# Patient Record
Sex: Female | Born: 1935 | Race: Black or African American | Hispanic: No | Smoking: Current some day smoker
Health system: Southern US, Community
[De-identification: ages and names within clinical notes are randomized; demographics above are authoritative.]

## PROBLEM LIST (undated history)

## (undated) ENCOUNTER — Emergency Department (HOSPITAL_COMMUNITY): Admission: EM | Payer: Medicare Other | Source: Home / Self Care

## (undated) DIAGNOSIS — N184 Chronic kidney disease, stage 4 (severe): Secondary | ICD-10-CM

## (undated) DIAGNOSIS — I82409 Acute embolism and thrombosis of unspecified deep veins of unspecified lower extremity: Secondary | ICD-10-CM

## (undated) DIAGNOSIS — I639 Cerebral infarction, unspecified: Secondary | ICD-10-CM

## (undated) DIAGNOSIS — I5032 Chronic diastolic (congestive) heart failure: Secondary | ICD-10-CM

## (undated) DIAGNOSIS — E782 Mixed hyperlipidemia: Secondary | ICD-10-CM

## (undated) DIAGNOSIS — I745 Embolism and thrombosis of iliac artery: Secondary | ICD-10-CM

## (undated) DIAGNOSIS — I119 Hypertensive heart disease without heart failure: Secondary | ICD-10-CM

## (undated) DIAGNOSIS — I358 Other nonrheumatic aortic valve disorders: Secondary | ICD-10-CM

## (undated) DIAGNOSIS — I313 Pericardial effusion (noninflammatory): Secondary | ICD-10-CM

## (undated) DIAGNOSIS — I10 Essential (primary) hypertension: Secondary | ICD-10-CM

## (undated) DIAGNOSIS — I951 Orthostatic hypotension: Secondary | ICD-10-CM

## (undated) DIAGNOSIS — J449 Chronic obstructive pulmonary disease, unspecified: Secondary | ICD-10-CM

## (undated) DIAGNOSIS — Z720 Tobacco use: Secondary | ICD-10-CM

## (undated) DIAGNOSIS — I701 Atherosclerosis of renal artery: Secondary | ICD-10-CM

## (undated) DIAGNOSIS — I739 Peripheral vascular disease, unspecified: Secondary | ICD-10-CM

## (undated) DIAGNOSIS — I3139 Other pericardial effusion (noninflammatory): Secondary | ICD-10-CM

## (undated) DIAGNOSIS — R55 Syncope and collapse: Secondary | ICD-10-CM

## (undated) DIAGNOSIS — J9600 Acute respiratory failure, unspecified whether with hypoxia or hypercapnia: Secondary | ICD-10-CM

## (undated) DIAGNOSIS — IMO0001 Reserved for inherently not codable concepts without codable children: Secondary | ICD-10-CM

## (undated) DIAGNOSIS — I4819 Other persistent atrial fibrillation: Secondary | ICD-10-CM

## (undated) HISTORY — DX: Other pericardial effusion (noninflammatory): I31.39

## (undated) HISTORY — PX: CATARACT EXTRACTION: SUR2

## (undated) HISTORY — DX: Reserved for inherently not codable concepts without codable children: IMO0001

## (undated) HISTORY — DX: Chronic diastolic (congestive) heart failure: I50.32

## (undated) HISTORY — DX: Atherosclerosis of renal artery: I70.1

## (undated) HISTORY — DX: Pericardial effusion (noninflammatory): I31.3

## (undated) HISTORY — DX: Other persistent atrial fibrillation: I48.19

## (undated) HISTORY — PX: OTHER SURGICAL HISTORY: SHX169

## (undated) HISTORY — DX: Hypertensive heart disease without heart failure: I11.9

## (undated) HISTORY — DX: Cerebral infarction, unspecified: I63.9

## (undated) HISTORY — DX: Chronic kidney disease, stage 4 (severe): N18.4

## (undated) HISTORY — DX: Syncope and collapse: R55

## (undated) HISTORY — DX: Orthostatic hypotension: I95.1

---

## 1997-11-08 HISTORY — PX: BRAIN TUMOR EXCISION: SHX577

## 2000-06-07 ENCOUNTER — Encounter (INDEPENDENT_AMBULATORY_CARE_PROVIDER_SITE_OTHER): Payer: Self-pay | Admitting: *Deleted

## 2000-06-07 ENCOUNTER — Other Ambulatory Visit: Admission: RE | Admit: 2000-06-07 | Discharge: 2000-06-07 | Payer: Self-pay | Admitting: Radiology

## 2000-06-07 ENCOUNTER — Encounter: Admission: RE | Admit: 2000-06-07 | Discharge: 2000-06-07 | Payer: Self-pay | Admitting: Internal Medicine

## 2000-06-07 ENCOUNTER — Encounter: Payer: Self-pay | Admitting: Internal Medicine

## 2001-09-25 ENCOUNTER — Encounter: Payer: Self-pay | Admitting: Internal Medicine

## 2001-09-25 ENCOUNTER — Ambulatory Visit (HOSPITAL_COMMUNITY): Admission: RE | Admit: 2001-09-25 | Discharge: 2001-09-25 | Payer: Self-pay | Admitting: Internal Medicine

## 2001-09-29 ENCOUNTER — Other Ambulatory Visit: Admission: RE | Admit: 2001-09-29 | Discharge: 2001-09-29 | Payer: Self-pay | Admitting: Obstetrics and Gynecology

## 2002-10-18 ENCOUNTER — Ambulatory Visit (HOSPITAL_COMMUNITY): Admission: RE | Admit: 2002-10-18 | Discharge: 2002-10-18 | Payer: Self-pay | Admitting: Internal Medicine

## 2002-10-18 ENCOUNTER — Encounter: Payer: Self-pay | Admitting: Internal Medicine

## 2003-10-24 ENCOUNTER — Ambulatory Visit (HOSPITAL_COMMUNITY): Admission: RE | Admit: 2003-10-24 | Discharge: 2003-10-24 | Payer: Self-pay | Admitting: Internal Medicine

## 2004-11-18 ENCOUNTER — Ambulatory Visit (HOSPITAL_COMMUNITY): Admission: RE | Admit: 2004-11-18 | Discharge: 2004-11-18 | Payer: Self-pay | Admitting: Internal Medicine

## 2005-11-23 ENCOUNTER — Ambulatory Visit (HOSPITAL_COMMUNITY): Admission: RE | Admit: 2005-11-23 | Discharge: 2005-11-23 | Payer: Self-pay | Admitting: Internal Medicine

## 2006-11-25 ENCOUNTER — Ambulatory Visit (HOSPITAL_COMMUNITY): Admission: RE | Admit: 2006-11-25 | Discharge: 2006-11-25 | Payer: Self-pay | Admitting: Internal Medicine

## 2007-11-28 ENCOUNTER — Ambulatory Visit (HOSPITAL_COMMUNITY): Admission: RE | Admit: 2007-11-28 | Discharge: 2007-11-28 | Payer: Self-pay | Admitting: Internal Medicine

## 2008-02-21 ENCOUNTER — Ambulatory Visit (HOSPITAL_COMMUNITY): Admission: RE | Admit: 2008-02-21 | Discharge: 2008-02-21 | Payer: Self-pay | Admitting: Internal Medicine

## 2008-02-23 ENCOUNTER — Ambulatory Visit (HOSPITAL_COMMUNITY): Admission: RE | Admit: 2008-02-23 | Discharge: 2008-02-23 | Payer: Self-pay | Admitting: Internal Medicine

## 2008-05-06 ENCOUNTER — Emergency Department (HOSPITAL_COMMUNITY): Admission: EM | Admit: 2008-05-06 | Discharge: 2008-05-06 | Payer: Self-pay | Admitting: Emergency Medicine

## 2008-05-08 DIAGNOSIS — I82409 Acute embolism and thrombosis of unspecified deep veins of unspecified lower extremity: Secondary | ICD-10-CM

## 2008-05-08 HISTORY — DX: Acute embolism and thrombosis of unspecified deep veins of unspecified lower extremity: I82.409

## 2008-05-20 ENCOUNTER — Ambulatory Visit (HOSPITAL_COMMUNITY): Admission: RE | Admit: 2008-05-20 | Discharge: 2008-05-20 | Payer: Self-pay | Admitting: Cardiovascular Disease

## 2008-05-21 ENCOUNTER — Inpatient Hospital Stay (HOSPITAL_COMMUNITY): Admission: AD | Admit: 2008-05-21 | Discharge: 2008-05-22 | Payer: Self-pay | Admitting: Cardiovascular Disease

## 2008-05-21 HISTORY — PX: ILIAC ARTERY STENT: SHX1786

## 2008-05-24 ENCOUNTER — Inpatient Hospital Stay (HOSPITAL_COMMUNITY): Admission: AD | Admit: 2008-05-24 | Discharge: 2008-05-31 | Payer: Self-pay | Admitting: Cardiovascular Disease

## 2008-05-24 ENCOUNTER — Ambulatory Visit: Payer: Self-pay | Admitting: Infectious Diseases

## 2009-01-07 ENCOUNTER — Inpatient Hospital Stay (HOSPITAL_COMMUNITY): Admission: AD | Admit: 2009-01-07 | Discharge: 2009-01-08 | Payer: Self-pay | Admitting: Cardiovascular Disease

## 2009-01-07 HISTORY — PX: ILIAC ARTERY STENT: SHX1786

## 2009-01-15 ENCOUNTER — Ambulatory Visit (HOSPITAL_COMMUNITY): Admission: RE | Admit: 2009-01-15 | Discharge: 2009-01-15 | Payer: Self-pay | Admitting: Internal Medicine

## 2009-05-12 ENCOUNTER — Emergency Department (HOSPITAL_COMMUNITY): Admission: EM | Admit: 2009-05-12 | Discharge: 2009-05-12 | Payer: Self-pay | Admitting: Emergency Medicine

## 2009-11-08 DIAGNOSIS — IMO0001 Reserved for inherently not codable concepts without codable children: Secondary | ICD-10-CM

## 2009-11-08 HISTORY — DX: Reserved for inherently not codable concepts without codable children: IMO0001

## 2010-01-19 ENCOUNTER — Ambulatory Visit (HOSPITAL_COMMUNITY)
Admission: RE | Admit: 2010-01-19 | Discharge: 2010-01-19 | Payer: Self-pay | Source: Home / Self Care | Admitting: Internal Medicine

## 2010-02-12 HISTORY — PX: DOPPLER ECHOCARDIOGRAPHY: SHX263

## 2010-02-12 HISTORY — PX: NM MYOCAR PERF WALL MOTION: HXRAD629

## 2010-11-17 ENCOUNTER — Ambulatory Visit (HOSPITAL_COMMUNITY)
Admission: RE | Admit: 2010-11-17 | Discharge: 2010-11-17 | Payer: Self-pay | Source: Home / Self Care | Attending: Ophthalmology | Admitting: Ophthalmology

## 2010-11-23 LAB — POCT I-STAT 4, (NA,K, GLUC, HGB,HCT)
Glucose, Bld: 111 mg/dL — ABNORMAL HIGH (ref 70–99)
HCT: 43 % (ref 36.0–46.0)
Hemoglobin: 14.6 g/dL (ref 12.0–15.0)
Potassium: 3.6 mEq/L (ref 3.5–5.1)
Sodium: 142 mEq/L (ref 135–145)

## 2010-12-17 ENCOUNTER — Other Ambulatory Visit (HOSPITAL_COMMUNITY): Payer: Self-pay | Admitting: Internal Medicine

## 2010-12-17 ENCOUNTER — Other Ambulatory Visit (HOSPITAL_COMMUNITY): Payer: Self-pay

## 2010-12-17 DIAGNOSIS — Z139 Encounter for screening, unspecified: Secondary | ICD-10-CM

## 2010-12-22 ENCOUNTER — Ambulatory Visit (HOSPITAL_COMMUNITY)
Admission: RE | Admit: 2010-12-22 | Discharge: 2010-12-22 | Disposition: A | Discharge status: Home / Self Care | Payer: MEDICARE | Source: Ambulatory Visit | Attending: Ophthalmology | Admitting: Ophthalmology

## 2010-12-22 DIAGNOSIS — Z7982 Long term (current) use of aspirin: Secondary | ICD-10-CM | POA: Insufficient documentation

## 2010-12-22 DIAGNOSIS — I10 Essential (primary) hypertension: Secondary | ICD-10-CM | POA: Insufficient documentation

## 2010-12-22 DIAGNOSIS — Z79899 Other long term (current) drug therapy: Secondary | ICD-10-CM | POA: Insufficient documentation

## 2010-12-22 DIAGNOSIS — H2510 Age-related nuclear cataract, unspecified eye: Secondary | ICD-10-CM | POA: Insufficient documentation

## 2011-01-22 ENCOUNTER — Ambulatory Visit (HOSPITAL_COMMUNITY)
Admission: RE | Admit: 2011-01-22 | Discharge: 2011-01-22 | Disposition: A | Discharge status: Home / Self Care | Payer: Medicare Other | Source: Ambulatory Visit | Attending: Internal Medicine | Admitting: Internal Medicine

## 2011-01-22 DIAGNOSIS — Z139 Encounter for screening, unspecified: Secondary | ICD-10-CM

## 2011-01-22 DIAGNOSIS — Z1231 Encounter for screening mammogram for malignant neoplasm of breast: Secondary | ICD-10-CM | POA: Insufficient documentation

## 2011-01-26 ENCOUNTER — Other Ambulatory Visit: Payer: Self-pay | Admitting: Internal Medicine

## 2011-01-26 DIAGNOSIS — R928 Other abnormal and inconclusive findings on diagnostic imaging of breast: Secondary | ICD-10-CM

## 2011-02-10 ENCOUNTER — Ambulatory Visit (HOSPITAL_COMMUNITY)
Admission: RE | Admit: 2011-02-10 | Discharge: 2011-02-10 | Disposition: A | Discharge status: Home / Self Care | Payer: Medicare Other | Source: Ambulatory Visit | Attending: Internal Medicine | Admitting: Internal Medicine

## 2011-02-10 DIAGNOSIS — R928 Other abnormal and inconclusive findings on diagnostic imaging of breast: Secondary | ICD-10-CM | POA: Insufficient documentation

## 2011-02-18 LAB — URINALYSIS, ROUTINE W REFLEX MICROSCOPIC
Bilirubin Urine: NEGATIVE
Glucose, UA: NEGATIVE mg/dL
Hgb urine dipstick: NEGATIVE
Ketones, ur: NEGATIVE mg/dL
Nitrite: NEGATIVE
Protein, ur: 100 mg/dL — AB
Specific Gravity, Urine: 1.025 (ref 1.005–1.030)
Urobilinogen, UA: 0.2 mg/dL (ref 0.0–1.0)
pH: 6 (ref 5.0–8.0)

## 2011-02-18 LAB — URINE CULTURE

## 2011-02-18 LAB — URINE MICROSCOPIC-ADD ON

## 2011-03-23 NOTE — Op Note (Signed)
NAME:  [name], [name]          ACCOUNT NO.:  [id number]   MEDICAL RECORD NO.:  [id number]          PATIENT TYPE:  INP   LOCATION:  3736                         FACILITY:  [facility]   PHYSICIAN:  Vanita Panda. Magnus Ivan, M.D.DATE OF BIRTH:  Apr 11, 1931   DATE OF PROCEDURE:  05/26/2008  DATE OF DISCHARGE:                               OPERATIVE REPORT   PREOPERATIVE DIAGNOSIS:  Left tibia peripheral vascular disease with 8 x  10 cm ischemic ulcer.   POSTOPERATIVE DIAGNOSES:  Superficial and deep dermal layer of partial-  thickness eschar, left tibia from chronic ulcer and peripheral vascular  disease.   FINDINGS:  Superficial necrotic tissue only with no underlying gross  purulence and good bleeding granulation tissue of deep dermal layers.   PROCEDURE:  Irrigation and debridement of left tibia wound/left tibia  necrotic ulcer (8 x 10 cm).   SURGEON:  Doneen Poisson, M.D.   ANESTHESIA:  General.   ANTIBIOTICS:  1 gram IV Ancef.   ESTIMATED BLOOD LOSS:  Minimal.   COMPLICATIONS:  None.   INDICATIONS:  Briefly, [name] is a 77 year old female with  ischemic ulcer on her distal third tibia on the lateral aspect.  She has  had a previous stenting type of procedure to the common iliac on the  left side to help with healing purposes, and she had been to the [facility] in [place], but only twice.  She presented to the hospital  with swollen leg and was found to have a DVT.  There was noted to be the  exudative tissue and purulence around the eschar.  I recommended that  she undergo irrigation and debridement of the eschar.  The risks and  benefits of this were explained to her and her family at length.  They  agreed to proceed with surgery.   PROCEDURE DESCRIPTION:  After informed consent was obtained, appropriate  left leg was marked.  She was brought to the operating room and placed  supine on the operating table.  General anesthesia was easily obtained.  Her left leg was then prepped and draped with DuraPrep and sterile  drapes were applied.  A time-out was called and she was identified as  the correct patient and correct left leg.  I then used a #10 blade and  easily unroofed the eschar, which was in the superficial dermal layers.  This was malodorous and there was only superficial purulence.  There was  no deep purulence whatsoever.  No evidence of abscess.  I then was able  to easily excise the necrotic tissue just using the scalpel for  debridement.  The whole base of the wound had excellent granulation  tissue and great bleeding and no exposed bone and no evidence of  infection.  I then used pulsatile lavage and 3 liters normal saline  solution to completely clean the wound.  Following this, we placed a  Xeroform and a wet-to-dry dressing over the leg as well.  There were no complications noted and she was awakened, extubated, and  taken to recovery room in stable condition.  Postoperatively, she would  benefit from further wound care  treatment and should do well in the long  run given good bleeding was encountered.      Vanita Panda. Magnus Ivan, M.D.  Electronically Signed     CYB/MEDQ  D:  05/26/2008  T:  05/27/2008  Job:  [id number]

## 2011-03-23 NOTE — Discharge Summary (Signed)
[name], [name]          ACCOUNT NO.:  [id number]   MEDICAL RECORD NO.:  [id number]          PATIENT TYPE:  INP   LOCATION:  3736                         FACILITY:  [facility]   PHYSICIAN:  Nanetta Batty, M.D.   DATE OF BIRTH:  [birth year]   DATE OF ADMISSION:  05/24/2008  DATE OF DISCHARGE:  05/31/2008                               DISCHARGE SUMMARY   CONSULTS:  Fransisco Hertz, MD, and Vanita Panda. Magnus Ivan, MD   PROCEDURES:  May 24, 2008, x-ray of her tibia and fibula showed  superficial cellulitis.  Plain films did not show any gross changes of  osteomyelitis.  May 26, 2008, she had a superficial and deep dermal  layer partial thickness eschar with irrigation and debridement of her  left tibia wound, left tibia necrotic ulcer 8 x 10 cm by Dr. Doneen Poisson.   DISCHARGE DIAGNOSES:  1. Left lower extremity leg wound infected with enterococcus species      sensitive to ampicillin and vancomycin treated initially with      vancomycin before cultures back.  Switched to amoxicillin and      doxycycline for continued therapy for 2 weeks post procedure.  She      had debridement done by Dr. Doneen Poisson on May 26, 2008.  2. Peroneal deep venous thrombosis now on Coumadin for approximately 3      months.  We will need outpatient followup Dopplers prior to      discontinuation of warfarin to be seen by Dr. Nanetta Batty as an      outpatient.  3. Arteriosclerotic peripheral vascular disease status post peripheral      vascular catheterization on May 21, 2008, with bilateral iliac      disease with stenting to her left iliac.  She does have femoral and      peroneal disease.  4. Hypertension with hypertensive heart disease.  5. Two-D echo Mar 27, 2008, showing an ejection fraction of 55%, mild      left ventricular hypertrophy and diastolic dysfunction, aortic      valve sclerosis.  6. Dyslipidemia.  7. History of brain tumor resection for hemangioma in 1999  at [facility]  8. Recent smoking cessation.  9. Persantine Myoview Mar 27, 2008, with no ischemia.   LABORATORY DATA:  May 31, 2008, hemoglobin 9.0, hematocrit 30.2, WBCs  9.1 and platelets 268.  INR was 2.1 on May 31, 2008.  Blood cultures  negative x2.  Sodium 138, potassium 3.4 and replaced, chloride 107, CO2  23, BUN 11, creatinine 0.92.  Lipid profile, total cholesterol 76,  triglycerides 54, HDL 37, LDL 28, TSH was 1.092, magnesium was 2.  No  chest x-ray done during this hospitalization.   HOSPITAL COURSE:  The patient came to see Dr. Allyson Sabal in the office on  May 24, 2008, because of swelling in her left lower extremity.  She had  been apparently to a wound clinic for her left leg wound after having PV  angio done on May 21, 2008.  Because of the swelling she had in her  foot, the  [facility] did a Doppler to rule out DVT and she did in  fact have a peroneal DVT.  This was done in the evening.  She had a  Lovenox injection and was referred to our office for further treatment.  She was seen by Dr. Allyson Sabal and admitted to the hospital.   Consults included Infectious Disease.  She was seen by Dr. Maurice March who  adjusted her antibiotics and made recommendations.  She was  seen by ortho who took her for debridement and continued to follow her  throughout the hospitalization.  At the time of discharge, she has an  Radio broadcast assistant on.  They had been changing on Mondays and Thursdays.  Dr.  Magnus Ivan wants to see her in 1-2 weeks and wants her followed at the  [facility] in [place].  As far as Infectious Disease, she is to  continue her antibiotic for a 2-week course post procedure.  Thus, she  will have home health to change her Unna boot on Monday until she can  get to the Wound Care Clinic and we will also have them draw a protime  level which can be faxed to Dr. Roque Lias office in [place].  In the  future,  she can follow up with Dr. Roque Lias office for protime  checks  arranged through our office.  She was in stable condition at the time of  discharge.  Her blood pressure was 150/60.  At the time of admission, we  did decrease her amlodipine because of her swelling in her foot, but at  the time of discharge, we increased it back to 10 mg for her blood  pressure.  She was up and ambulating.  PT had seen her.  She had no  problems with independent walking.   DISCHARGE MEDICATIONS:  1. Amlodipine 10 mg everyday.  2. Labetalol 300 mg b.i.d.  3. Simvastatin 20 mg everyday.  4. Cilostazol 50 mg b.i.d.  5. Baby aspirin 81 mg everyday.  6. Plavix 75 mg everyday.  7. Quinapril and hydrochlorothiazide 20/25 b.i.d.  8. Amoxicillin 500 mg three times a day through June 09, 2008.  9. Doxycycline 100 mg twice per day through June 09, 2008.  10.Warfarin 5 mg daily except 7.5 on Saturday and Tuesdays.   She was told to call [facility] and get an appointment to be  seen next week.  She should see Dr. Magnus Ivan in 1-2 weeks, Dr. Allyson Sabal on  June 28, 2008, at 10:30 and home health should change her wound  dressing.      Lezlie Octave, N.P.       Nanetta Batty, M.D.  Electronically Signed    BB/MEDQ  D:  05/31/2008  T:  05/31/2008  Job:  [id number]   cc:   [name] D. Felecia Shelling, MD  Dani Gobble, MD  Vanita Panda. Magnus Ivan, M.D.  Dr. Burman Freestone

## 2011-03-23 NOTE — Discharge Summary (Signed)
NAMESHIRL, WEIR          ACCOUNT NO.:  [id number]   MEDICAL RECORD NO.:  [id number]          PATIENT TYPE:  INP   LOCATION:  4703                         FACILITY:  [facility]   PHYSICIAN:  Nanetta Batty, M.D.   DATE OF BIRTH:  1931/05/04   DATE OF ADMISSION:  01/07/2009  DATE OF DISCHARGE:  01/08/2009                               DISCHARGE SUMMARY   ADDENDUM   Ms. Halfhill went home on Flagyl 500 mg b.i.d. for 7 days instead of  Flagyl and Cipro as previously indicated.      Abelino Derrick, P.A.      Nanetta Batty, M.D.  Electronically Signed    LKK/MEDQ  D:  01/08/2009  T:  01/08/2009  Job:  [id number]

## 2011-03-23 NOTE — Discharge Summary (Signed)
NAMEJADENE, STEMMER          ACCOUNT NO.:  [id number]   MEDICAL RECORD NO.:  [id number]          PATIENT TYPE:  INP   LOCATION:  4703                         FACILITY:  [facility]   PHYSICIAN:  Nanetta Batty, M.D.   DATE OF BIRTH:  10-26-1931   DATE OF ADMISSION:  01/07/2009  DATE OF DISCHARGE:  01/08/2009                               DISCHARGE SUMMARY   DISCHARGE DIAGNOSES:  1. Claudication, right external iliac artery percutaneous transluminal      angioplasty and stenting this admission.  2. Known vascular disease with previous percutaneous transluminal      angioplasty and stenting of the left iliac in July 2009.  3. Residual 70% right renal artery stenosis, it is being followed.  4. Past history of deep venous thrombosis, treated with Coumadin,      which has been discontinued.  5. Negative Myoview and normal left ventricular function, May 2009.  6. Treated hypertension.  7. Treated dyslipidemia.   HOSPITAL COURSE:  The patient is a 77 year old female from [place]  who was seen by Dr. Domingo Sep.  She has known peripheral vascular  disease.  She had a left iliac PTA and stenting in July 2009 by Dr.  Allyson Sabal.  She has known occlusion of her left SFA as well as a right  external iliac artery and a 70% right renal artery stenosis.  She  continues to have claudication of her right leg.  She was seen by Dr.  Allyson Sabal on September 03, 2008.  Lower extremity venous and arterial Dopplers  were obtained.  There was no evidence of DVT.  ABIs were abnormal with  an ABI of 0.3 on the right and 0.56 on the left.  Dr. Allyson Sabal felt we  could safely stop her Coumadin and leave her on aspirin and Plavix.  She  was seen in the office on December 16, 2008, and set up for elective  peripheral angiogram.  This was done on January 07, 2009.  She underwent  PTA and stenting of the right external iliac artery with good results.  We feel she can be discharged on January 08, 2009.   LABORATORIES:  The patient  did have some Trichomonas in her urine pre  procedure and this was repeated as well as urine culture in the  hospital, the results of this are pending.   PREOPERATIVE LABORATORIES:  White count 7.5, hemoglobin 13.5, hematocrit  42.3, and platelets 227.  INR 1.0.  Sodium 144, potassium 4.1, BUN 22,  and creatinine 1.14.  Liver functions were normal.  EKG shows sinus  rhythm, LVH, and sinus bradycardia.   DISPOSITION:  The patient is discharged in stable condition and will  follow up with Dr. Allyson Sabal with outpatient Dopplers in a couple weeks.      Abelino Derrick, P.A.      Nanetta Batty, M.D.  Electronically Signed    LKK/MEDQ  D:  01/08/2009  T:  01/08/2009  Job:  [id number]

## 2011-03-23 NOTE — Cardiovascular Report (Signed)
NAMEMITZIE, MARLAR          ACCOUNT NO.:  [id number]   MEDICAL RECORD NO.:  [id number]          PATIENT TYPE:  INP   LOCATION:  6525                         FACILITY:  [facility]   PHYSICIAN:  Nanetta Batty, M.D.   DATE OF BIRTH:  1931/01/30   DATE OF PROCEDURE:  DATE OF DISCHARGE:                            CARDIAC CATHETERIZATION   [name] is a 77 year old African American female referred to me by  Dr. Domingo Sep for critical limb ischemia.  She was originally seen by Dr.  Felecia Shelling, who referred her to Dr. Domingo Sep.  Dr. Domingo Sep referred her to  me.  She had a normal cardiac eval including dipyridamole Myoview and 2-  D echo.  Dopplers revealed markedly diminished ABIs and CT angiogram  revealed bilateral occluded external iliacs.  She did have critical limb  ischemia with ischemic with ischemic appearing ulcer on the lateral  aspect of the left leg.  I reviewed her angiogram performed yesterday  with 1 mm cuts with Dr. Ayesha Rumpf.  We both felt that there was enough room  between femoral entry and the site of occlusion to warrant attempted PTA  and stenting as opposed to surgical revascularization.  The patient held  her quinapril, hydrochlorothiazide, and her creatinine was less than 1  today.   DESCRIPTION OF PROCEDURE:  The patient was brought to the second floor  of the [place] PV Angiographic suite in the postabsorptive state.  She was premedicated with p.o. Valium.  Her groins were prepped and  shaved in usual sterile fashion.  A 1% Xylocaine was used as local  anesthesia.  A 5-French sheath was inserted into the left femoral artery  using standard Seldinger technique under direct angiographic  visualization.  Bolus chase subtraction angiography was performed of the  left lower extremity.  Visipaque dye was used to entirety of the case.  Aortic pressures monitored in the case.   The patient received 3000 units of heparin intravenously.  The external  iliac artery was  traversed with the 035 Quick-Cross catheter and a extra  support 0.5 Glidewire which fairly easily crossed the occlusion and was  free in the distal aorta.  This was exchanged through an end-hole  catheter for a Wooley wire and an abdominal aortogram was then performed  revealing the occluded iliacs bilaterally.  PTA was then performed with  a 4 x 10 Powerflex stenting with a 7 x 80 Smart nitinol self-expanding  stent.  This was then postdilated with a 6-8 Powerflex and had a 7-2  Powerflex at the ostium of the external resulting reduction with total  occlusion to 0% residual without dissection with excellent flow.  There  is no gradient noted.  The remainder of her anatomy did reveal a 70%  right renal artery stenosis.  Her SFA on the left had a 60% ostial size  proximal stenosis, and a short segment occlusion in the midportion.  There is one-vessel runoff via the peroneal.  The anterior tibial is  occluded.   IMPRESSION:  Successful PTA and stenting of a chronically occluded left  external iliac artery for critical limb ischemia, nonhealing ulcer.  The  patient tolerated the procedure well.  She did receive 300 mg of p.o.  Plavix.  The  patient was gently hydrated and discharged home in the morning on  aspirin, Plavix.  She will see me back after getting lower extremity  Dopplers in approximately 1 week.  She will be referred to Wound Center  for further aggressive local care.  She left the lab in stable  condition.      Nanetta Batty, M.D.  Electronically Signed     JB/MEDQ  D:  05/21/2008  T:  05/22/2008  Job:  [id number]   cc:   [facility]  [facility]  K[place], Dr.  Ninetta Lights D. Felecia Shelling, MD

## 2011-03-23 NOTE — Cardiovascular Report (Signed)
NAMESARAHY, Holden          ACCOUNT NO.:  [id number]   MEDICAL RECORD NO.:  [id number]          PATIENT TYPE:  INP   LOCATION:  4703                         FACILITY:  [facility]   PHYSICIAN:  [name], M.D.   DATE OF BIRTH:  [birth year]   DATE OF PROCEDURE:  01/07/2009  DATE OF DISCHARGE:                            CARDIAC CATHETERIZATION   [name] is a 77 year old African American female with history of  PAD, status post left external iliac artery PTA and stenting by myself  on May 26, 2008, for critical limb ischemia and nonhealing ulcer.  She  has a short segment occlusion of her mid left SFA as well as right  external iliac artery stenosis.  She also has 70% right renal artery  stenosis.  Other problems include dyslipidemia and discontinue tobacco  abuse as well as strong family history for heart disease.  Ultimately,  her ulcer healed after percutaneous revascularization of her left lower  extremity.  This was remained patent by duplex ultrasound.  She does  complain of a continued right lower extremity claudication.  She  presents now for elective endovascular therapy of her right external  iliac artery for relief of symptoms of functional limiting claudication.   DESCRIPTION OF PROCEDURE:  The patient was brought to the second floor  [place] [facility] Suite in the postabsorptive state.  She was  premedicated with p.o. Valium and IV fentanyl.  Her right and left  groins were prepped and shaved in the usual sterile fashion.  Xylocaine  1% was used for local anesthesia.  A 5-French sheath was inserted into  the left femoral artery using standard Seldinger technique.  A 6-French  sheath was inserted into the right femoral artery using a Doppler tip  needle under direct fluoroscopic and angiographic control.  A 5-French  Tennis Racquet catheter was used for abdominal aortography.  Visipaque  dye was used for the entirety of the case.  Retrograde aortic  pressures  were monitored in the case.  The central aortic pressure was measured  224/76.  The right femoral artery pressure prior to revascularization  measured 97/65.  The patient received a total 5000 units of heparin  intravenously and received a total of 160 mL of contrast.  Dr. Jacinto Halim  assisted during the case.   The angiogram through the 5-French Tennis Racquet catheter revealed  widely patent left external iliac artery stent.  It nicely delineated  the proximal and distal end of the right external iliac artery  occlusion.  Using a 90-cm long 0.035 QuickCross catheter along with an  0.035 angled stiff Glidewire, we were able to cross the total occlusion  and reenter the intraluminal space above the internal and external  bifurcation.  Once reentry into the true lumen was established, the  pressure tracings and angiography, the Glidewire was exchanged for a  Wholey wire.  Following this, a 5 x 10 Opta balloon was used to dilate  the lesion.  This was then stented with an 8 mm x 10 cm SMART Control  nitinol self-expanding stent, which was deployed precisely at the  bifurcation of the internal and  external iliac on the right and continue  down to the right common femoral artery.  This was then postdilated  gently 7 x 60 Abbott Fox balloon at 1 and 2 atmospheres resulting  reduction of total occlusion to 0% residual with excellent flow.  The  patient tolerated the procedure well.  Completion angiography of the  abdominal aortography confirmed the excellent angiographic result.   IMPRESSION:  Successful percutaneous transluminal angioplasty and  stenting of the right external iliac artery reducing a long segment  total occlusion to 0% residual.  The patient tolerated the procedure  well.  The guidewire and catheter were removed.  The sheath was sewn  securely in place.  An ACT will be measured and the sheaths will be  removed.  Pressure was held on the groin to achieve hemostasis.   The  patient left the lab in stable condition.      [name], M.D.  Electronically Signed     JB/MEDQ  D:  01/07/2009  T:  01/08/2009  Job:  [id number]   cc:   Second Floor [facility]  [facility]  [name] D. Felecia Shelling, MD

## 2011-03-26 NOTE — Discharge Summary (Signed)
[name], [name]          ACCOUNT NO.:  [id number]   MEDICAL RECORD NO.:  [id number]          PATIENT TYPE:  INP   LOCATION:  6525                         FACILITY:  [facility]   PHYSICIAN:  Nanetta Batty, M.D.   DATE OF BIRTH:  [birth year]   DATE OF ADMISSION:  05/21/2008  DATE OF DISCHARGE:  05/22/2008                               DISCHARGE SUMMARY   DISCHARGE DIAGNOSES:  1. Severe peripheral vascular ischemia.  2. Peripheral vascular disease with left external iliac artery      stenosis in our undergoing percutaneous transluminal angioplasty      and stent.  3. Hypertension.  4. Dyslipidemia.  5. Tobacco use.  6. Borderline diabetes mellitus, glycohemoglobin 6.12, follow up with      Dr. Felecia Shelling.  7. Left lower extremity wound, followed at Wound Center in [place].   DISCHARGE CONDITION:  Stable.   PROCEDURES:  PV angiogram by Dr. Allyson Sabal, May 21, 2008.  On May 21, 2008, PTA and stent deployment to the left EIA.   DISCHARGE MEDICATIONS:  1. Amlodipine 10 mg daily.  2. Labetalol 300 mg twice a day.  3. Simvastatin 20 mg daily.  4. Pletal 50 mg twice a day.  5. Baby aspirin 81 daily.  6. Plavix 75 mg 1 daily.  7. Quinapril/HCT 20/25 one twice a day as before.   DISCHARGE INSTRUCTIONS:  1. Low-sodium, heart-healthy diet and low sugar, low-carb diet.  2. Wash cath site with soap and water.  Call if any bleeding,      swelling, or drainage.  Increase activity slowly.  May shower or      bathe.  No lifting for 1 week.  No driving for 2 days.  Follow up      with Dr. Allyson Sabal, June 14, 2008, at 10:30 a.m.  3. Followup with Dr. Felecia Shelling to monitor glucose.  4. She was scheduled for lower extremity Dopplers on the second floor      of Dr. Hazle Coca office June 10, 2008, at 1:00 p.m. in [place].      She will go to [facility] for wound care, office will call with      date and time.   HISTORY OF PRESENT ILLNESS:  A 77 year old female referred to Dr. Allyson Sabal  by Dr. Domingo Sep for  evaluation of peripheral vascular disease.  Cardiac  history shows normal 2-D echo and normal Myoview stress test.  She is  attempting tobacco discontinuation.  She has hypertension and  dyslipidemia.  In April 2009, she had abnormal Doppler studies of her  lower extremities and abnormal ABIs.  She has a CT angiogram following  this revealing occluded external iliac bilaterally, occluded right SFA,  and distal SFA, and she has lifestyle-limiting claudication.  She also  has an ischemic-appearing ulcer in the lateral aspect of the right leg  just above her ankle.  Because of these factors, she was brought into  the hospital electively for PV angiogram with results as previously  stated with stent to the left EIA.  She did well.  By the next morning  on May 22, 2008, she was stable and ready  for discharge home.  She had  no complaints and would follow up as an outpatient.   LABORATORY VALUES:  Hemoglobin 12.8, hematocrit 37.6, WBC 10.4, and  platelets 279.  PT 13.2, INR of 1, and PTT 30.   Sodium 140, potassium 4.4, chloride 107, CO2 of 23, glucose 128, BUN 12,  and creatinine 0.99.   Chest x-ray:  Cardiomegaly and tortuous aorta, no active disease.  The  patient was seen and discharged by Dr. Allyson Sabal.      Darcella Gasman. Annie Paras, N.P.      Nanetta Batty, M.D.  Electronically Signed    LRI/MEDQ  D:  06/18/2008  T:  06/19/2008  Job:  [id number]   cc:   Nanetta Batty, M.D.  [name] D. Felecia Shelling, MD  Dani Gobble, MD

## 2011-08-05 LAB — DIFFERENTIAL
Basophils Absolute: 0
Basophils Relative: 0
Eosinophils Absolute: 0
Eosinophils Relative: 0
Lymphs Abs: 1.4
Neutrophils Relative %: 81 — ABNORMAL HIGH

## 2011-08-05 LAB — BASIC METABOLIC PANEL
BUN: 46 — ABNORMAL HIGH
BUN: 12
CO2: 23
Calcium: 9.7
Chloride: 104
Chloride: 107
Creatinine, Ser: 1.84 — ABNORMAL HIGH
Creatinine, Ser: 0.99
GFR calc non Af Amer: 27 — ABNORMAL LOW
Glucose, Bld: 187 — ABNORMAL HIGH
Glucose, Bld: 128 — ABNORMAL HIGH
Potassium: 3.7
Potassium: 4.4
Sodium: 140

## 2011-08-05 LAB — CBC
HCT: 37.9
MCV: 84.6
Platelets: 256
RBC: 4.37
RDW: 12.9
WBC: 9.4
WBC: 10.4

## 2011-08-05 LAB — PROTIME-INR
INR: 1
Prothrombin Time: 13.2

## 2011-08-05 LAB — HEMOGLOBIN A1C
Hgb A1c MFr Bld: 6.1
Mean Plasma Glucose: 140

## 2011-08-06 LAB — BASIC METABOLIC PANEL
BUN: 14
CO2: 23
Calcium: 9
Chloride: 107
Chloride: 108
Creatinine, Ser: 0.92
Creatinine, Ser: 0.98
GFR calc Af Amer: >60
GFR calc Af Amer: >60
GFR calc non Af Amer: 55 — ABNORMAL LOW
Glucose, Bld: 103 — ABNORMAL HIGH
Glucose, Bld: 95
Potassium: 3.4 — ABNORMAL LOW
Potassium: 3.7
Sodium: 138
Sodium: 138
Sodium: 140

## 2011-08-06 LAB — COMPREHENSIVE METABOLIC PANEL
ALT: 14
AST: 15
Albumin: 3.1 — ABNORMAL LOW
CO2: 24
Calcium: 9.3
GFR calc Af Amer: 44 — ABNORMAL LOW
Sodium: 137
Total Protein: 7

## 2011-08-06 LAB — CBC
HCT: 29 — ABNORMAL LOW
HCT: 29.4 — ABNORMAL LOW
HCT: 29.8 — ABNORMAL LOW
HCT: 30.3 — ABNORMAL LOW
HCT: 31.5 — ABNORMAL LOW
Hemoglobin: 10 — ABNORMAL LOW
Hemoglobin: 10.2 — ABNORMAL LOW
Hemoglobin: 10.4 — ABNORMAL LOW
Hemoglobin: 11.1 — ABNORMAL LOW
Hemoglobin: 9.6 — ABNORMAL LOW
Hemoglobin: 9.8 — ABNORMAL LOW
Hemoglobin: 9.8 — ABNORMAL LOW
Hemoglobin: 9.9 — ABNORMAL LOW
MCHC: 32.5
MCHC: 32.8
MCHC: 33
MCHC: 33.2
MCV: 85.5
MCV: 85.8
MCV: 85.9
MCV: 86
MCV: 87
MCV: 87.1
MCV: 87.1
Platelets: 229
Platelets: 234
Platelets: 244
Platelets: 244
Platelets: 245
Platelets: 265
RBC: 3.41 — ABNORMAL LOW
RBC: 3.47 — ABNORMAL LOW
RBC: 3.51 — ABNORMAL LOW
RBC: 3.88
RBC: 4.01
RDW: 12.4
RDW: 12.5
RDW: 12.6
WBC: 10.3
WBC: 11.7 — ABNORMAL HIGH
WBC: 9.1
WBC: 9.1
WBC: 9.5
WBC: 9.9

## 2011-08-06 LAB — HEPARIN LEVEL (UNFRACTIONATED)
Heparin Unfractionated: 0.5
Heparin Unfractionated: 0.55

## 2011-08-06 LAB — CULTURE, BLOOD (ROUTINE X 2)
Culture: NO GROWTH
Culture: NO GROWTH

## 2011-08-06 LAB — DIFFERENTIAL
Eosinophils Absolute: 0.1
Eosinophils Relative: 1
Lymphocytes Relative: 16
Lymphs Abs: 1.6
Monocytes Absolute: 0.4
Monocytes Relative: 4

## 2011-08-06 LAB — APTT: aPTT: 32

## 2011-08-06 LAB — PROTIME-INR
INR: 1.1
INR: 1.2
INR: 1.6 — ABNORMAL HIGH
INR: 1.8 — ABNORMAL HIGH
INR: 2.1 — ABNORMAL HIGH
Prothrombin Time: 25.2 — ABNORMAL HIGH

## 2011-08-06 LAB — WOUND CULTURE

## 2011-08-06 LAB — TSH: TSH: 1.092

## 2012-01-17 ENCOUNTER — Other Ambulatory Visit (HOSPITAL_COMMUNITY): Payer: Self-pay | Admitting: Internal Medicine

## 2012-01-17 DIAGNOSIS — Z139 Encounter for screening, unspecified: Secondary | ICD-10-CM

## 2012-01-25 ENCOUNTER — Ambulatory Visit (HOSPITAL_COMMUNITY)
Admission: RE | Admit: 2012-01-25 | Discharge: 2012-01-25 | Disposition: A | Discharge status: Home / Self Care | Payer: Medicare Other | Source: Ambulatory Visit | Attending: Internal Medicine | Admitting: Internal Medicine

## 2012-01-25 DIAGNOSIS — Z1231 Encounter for screening mammogram for malignant neoplasm of breast: Secondary | ICD-10-CM | POA: Insufficient documentation

## 2012-01-25 DIAGNOSIS — Z139 Encounter for screening, unspecified: Secondary | ICD-10-CM

## 2012-02-25 HISTORY — PX: OTHER SURGICAL HISTORY: SHX169

## 2012-09-13 HISTORY — PX: OTHER SURGICAL HISTORY: SHX169

## 2012-10-24 ENCOUNTER — Other Ambulatory Visit (HOSPITAL_COMMUNITY): Payer: Self-pay | Admitting: Cardiovascular Disease

## 2012-10-24 DIAGNOSIS — I739 Peripheral vascular disease, unspecified: Secondary | ICD-10-CM

## 2012-11-15 ENCOUNTER — Encounter (HOSPITAL_COMMUNITY): Payer: Medicare Other

## 2012-12-20 ENCOUNTER — Other Ambulatory Visit (HOSPITAL_COMMUNITY): Payer: Self-pay | Admitting: Internal Medicine

## 2012-12-20 DIAGNOSIS — Z139 Encounter for screening, unspecified: Secondary | ICD-10-CM

## 2013-01-04 ENCOUNTER — Emergency Department (HOSPITAL_COMMUNITY): Payer: Medicare Other

## 2013-01-04 ENCOUNTER — Observation Stay (HOSPITAL_COMMUNITY)
Admission: EM | Admit: 2013-01-04 | Discharge: 2013-01-07 | Disposition: A | Discharge status: Home / Self Care | Payer: Medicare Other | Attending: Internal Medicine | Admitting: Internal Medicine

## 2013-01-04 ENCOUNTER — Encounter (HOSPITAL_COMMUNITY): Payer: Self-pay

## 2013-01-04 DIAGNOSIS — I4891 Unspecified atrial fibrillation: Secondary | ICD-10-CM | POA: Insufficient documentation

## 2013-01-04 DIAGNOSIS — I951 Orthostatic hypotension: Principal | ICD-10-CM | POA: Insufficient documentation

## 2013-01-04 DIAGNOSIS — N179 Acute kidney failure, unspecified: Secondary | ICD-10-CM | POA: Insufficient documentation

## 2013-01-04 DIAGNOSIS — I10 Essential (primary) hypertension: Secondary | ICD-10-CM | POA: Insufficient documentation

## 2013-01-04 DIAGNOSIS — I739 Peripheral vascular disease, unspecified: Secondary | ICD-10-CM | POA: Insufficient documentation

## 2013-01-04 DIAGNOSIS — N289 Disorder of kidney and ureter, unspecified: Secondary | ICD-10-CM

## 2013-01-04 DIAGNOSIS — R55 Syncope and collapse: Secondary | ICD-10-CM

## 2013-01-04 HISTORY — DX: Embolism and thrombosis of iliac artery: I74.5

## 2013-01-04 HISTORY — DX: Other nonrheumatic aortic valve disorders: I35.8

## 2013-01-04 HISTORY — DX: Acute embolism and thrombosis of unspecified deep veins of unspecified lower extremity: I82.409

## 2013-01-04 HISTORY — DX: Peripheral vascular disease, unspecified: I73.9

## 2013-01-04 LAB — CBC WITH DIFFERENTIAL/PLATELET
Basophils Relative: 0 % (ref 0–1)
HCT: 45.1 % (ref 36.0–46.0)
Hemoglobin: 14.5 g/dL (ref 12.0–15.0)
Lymphocytes Relative: 25 % (ref 12–46)
Lymphs Abs: 1.7 10*3/uL (ref 0.7–4.0)
Monocytes Absolute: 0.3 10*3/uL (ref 0.1–1.0)
Monocytes Relative: 5 % (ref 3–12)
Neutro Abs: 4.7 10*3/uL (ref 1.7–7.7)
Neutrophils Relative %: 69 % (ref 43–77)
RBC: 5.2 MIL/uL — ABNORMAL HIGH (ref 3.87–5.11)
WBC: 6.8 10*3/uL (ref 4.0–10.5)

## 2013-01-04 LAB — URINALYSIS, ROUTINE W REFLEX MICROSCOPIC
Hgb urine dipstick: NEGATIVE
Nitrite: NEGATIVE
Specific Gravity, Urine: >1.03 — ABNORMAL HIGH (ref 1.005–1.030)
Urobilinogen, UA: 0.2 mg/dL (ref 0.0–1.0)
pH: 5.5 (ref 5.0–8.0)

## 2013-01-04 LAB — URINE MICROSCOPIC-ADD ON

## 2013-01-04 LAB — BASIC METABOLIC PANEL
BUN: 31 mg/dL — ABNORMAL HIGH (ref 6–23)
CO2: 29 mEq/L (ref 19–32)
Chloride: 103 mEq/L (ref 96–112)
GFR calc Af Amer: 42 mL/min — ABNORMAL LOW (ref >90)
Potassium: 3.5 mEq/L (ref 3.5–5.1)

## 2013-01-04 LAB — TROPONIN I: Troponin I: <0.3 ng/mL (ref <0.30)

## 2013-01-04 MED ORDER — ASPIRIN 81 MG PO CHEW
81.0000 mg | CHEWABLE_TABLET | Freq: Every day | ORAL | Status: DC
  Administered 2013-01-04 – 2013-01-07 (×4): 81 mg via ORAL
  Filled 2013-01-04 (×4): qty 1

## 2013-01-04 MED ORDER — SODIUM CHLORIDE 0.9 % IV SOLN
INTRAVENOUS | Status: AC
  Administered 2013-01-04: 21:00:00 via INTRAVENOUS

## 2013-01-04 MED ORDER — ENOXAPARIN SODIUM 40 MG/0.4ML ~~LOC~~ SOLN
40.0000 mg | SUBCUTANEOUS | Status: DC
  Administered 2013-01-04 – 2013-01-05 (×2): 40 mg via SUBCUTANEOUS
  Filled 2013-01-04 (×2): qty 0.4

## 2013-01-04 NOTE — ED Notes (Signed)
Pt was waiting in dr.fanta's office for regualr check up and stood when called, was dizzy and had near syncopal episode. Denies cp or sob. ems reports was in afib, no h/o of afib.

## 2013-01-04 NOTE — ED Notes (Signed)
Medical history obtained from Dr. Letitia Neri office, provided to MD

## 2013-01-04 NOTE — ED Provider Notes (Signed)
History     CSN: [id number]  Arrival date & time 01/04/13  1116   First MD Initiated Contact with Patient 01/04/13 1310      Chief Complaint  Patient presents with  . Near Syncope     HPI Pt was seen at 1335.   Per pt, c/o gradual onset and persistence of constant lightheadedness that began while at her doctor's office PTA.  Pt states she was "just stepping up to get on the scale" when she felt lightheaded. Pt describes her lightheadedness as "feeling like she was going to pass out."  Denies CP/palpitations, no SOB/cough, no abd pain, no N/V/D, no visual changes, no focal motor weakness, no tingling/numbness in extremities, no facial droop, no slurred speech.     Past Medical History  Diagnosis Date  . Hypercholesteremia   . Coronary artery disease   . PVD (peripheral vascular disease)   . Hypertension   . DVT (deep venous thrombosis) 05/2008  . Bilateral iliac artery occlusion   . Aortic valve sclerosis     Past Surgical History  Procedure Laterality Date  . Cardiac surgery    . Iliac artery stent Left 05/2008    Dr. Allyson Sabal  . Iliac artery stent Right 2010    Dr. Allyson Sabal  . Brain tumor excision  1999    for hemangioma [facility])    No family history on file.  Extensive CAD hx  History  Substance Use Topics  . Smoking status: Current Every Day Smoker    Types: Cigarettes  . Smokeless tobacco: Not on file  . Alcohol Use: No     Review of Systems ROS: Statement: All systems negative except as marked or noted in the HPI; Constitutional: Negative for fever and chills. ; ; Eyes: Negative for eye pain, redness and discharge. ; ; ENMT: Negative for ear pain, hoarseness, nasal congestion, sinus pressure and sore throat. ; ; Cardiovascular: Negative for chest pain, palpitations, diaphoresis, dyspnea and peripheral edema. ; ; Respiratory: Negative for cough, wheezing and stridor. ; ; Gastrointestinal: Negative for nausea, vomiting, diarrhea, abdominal pain, blood in stool,  hematemesis, jaundice and rectal bleeding. . ; ; Genitourinary: Negative for dysuria, flank pain and hematuria. ; ; Musculoskeletal: Negative for back pain and neck pain. Negative for swelling and trauma.; ; Skin: Negative for pruritus, rash, abrasions, blisters, bruising and skin lesion.; ; Neuro: +lightheadedness, near syncope. Negative for headache and neck stiffness. Negative for weakness, altered level of consciousness , altered mental status, extremity weakness, paresthesias, involuntary movement, seizure and syncope.       Allergies  Review of patient's allergies indicates no known allergies.  Home Medications   Current Outpatient Rx  Name  Route  Sig  Dispense  Refill  . amLODipine (NORVASC) 10 MG tablet   Oral   Take 10 mg by mouth daily.         Marland Kitchen aspirin EC 81 MG tablet   Oral   Take 81 mg by mouth daily.         . clopidogrel (PLAVIX) 75 MG tablet   Oral   Take 75 mg by mouth daily.         Marland Kitchen ibuprofen (ADVIL,MOTRIN) 600 MG tablet   Oral   Take 600 mg by mouth every 6 (six) hours as needed for pain.         Marland Kitchen labetalol (NORMODYNE) 300 MG tablet   Oral   Take 300 mg by mouth 2 (two) times daily.         Marland Kitchen  quinapril-hydrochlorothiazide (ACCURETIC) 20-25 MG per tablet   Oral   Take 1 tablet by mouth daily.         . simvastatin (ZOCOR) 20 MG tablet   Oral   Take 20 mg by mouth every evening.           BP 147/76  Pulse 75  Temp(Src) 97.7 F (36.5 C)  Resp 23  SpO2 93%  Physical Exam 1340: Physical examination:  Nursing notes reviewed; Vital signs and O2 SAT reviewed;  Constitutional: Well developed, Well nourished, Well hydrated, In no acute distress; Head:  Normocephalic, atraumatic; Eyes: EOMI, PERRL, No scleral icterus; ENMT: Mouth and pharynx normal, Mucous membranes moist; Neck: Supple, Full range of motion, No lymphadenopathy; Cardiovascular: Irregular Irregular rate and rhythm, No gallop; Respiratory: Breath sounds clear & equal  bilaterally, No rales, rhonchi, wheezes.  Speaking full sentences with ease, Normal respiratory effort/excursion; Chest: Nontender, Movement normal; Abdomen: Soft, Nontender, Nondistended, Normal bowel sounds; Genitourinary: No CVA tenderness; Extremities: Pulses normal, No tenderness, No edema, No calf edema or asymmetry.; Neuro: AA&Ox3, Major CN grossly intact.  Strength 5/5 equal bilat UE's and LE's.  DTR 2/4 equal bilat UE's and LE's.  No gross sensory deficits.  Normal cerebellar testing bilat UE's (finger-nose) and LE's (heel-shin). Speech clear.  No facial droop.  No nystagmus.;;.; Skin: Color normal, Warm, Dry.   ED Course  Procedures   MDM  MDM Reviewed: previous chart, nursing note and vitals Reviewed previous: labs Interpretation: labs, ECG, x-ray and CT scan    Date: 01/04/2013  Rate: 65  Rhythm: atrial fibrillation  QRS Axis: normal  Intervals: normal  ST/T Wave abnormalities: nonspecific T wave changes leads I and aVL  Conduction Disutrbances:none  Narrative Interpretation:   Old EKG Reviewed: none available    Results for orders placed during the hospital encounter of 01/04/13  CBC WITH DIFFERENTIAL      Result Value Range   WBC 6.8  4.0 - 10.5 K/uL   RBC 5.20 (*) 3.87 - 5.11 MIL/uL   Hemoglobin 14.5  12.0 - 15.0 g/dL   HCT 47.8  29.5 - 62.1 %   MCV 86.7  78.0 - 100.0 fL   MCH 27.9  26.0 - 34.0 pg   MCHC 32.2  30.0 - 36.0 g/dL   RDW 30.8  65.7 - 84.6 %   Platelets 205  150 - 400 K/uL   Neutrophils Relative 69  43 - 77 %   Neutro Abs 4.7  1.7 - 7.7 K/uL   Lymphocytes Relative 25  12 - 46 %   Lymphs Abs 1.7  0.7 - 4.0 K/uL   Monocytes Relative 5  3 - 12 %   Monocytes Absolute 0.3  0.1 - 1.0 K/uL   Eosinophils Relative 1  0 - 5 %   Eosinophils Absolute 0.1  0.0 - 0.7 K/uL   Basophils Relative 0  0 - 1 %   Basophils Absolute 0.0  0.0 - 0.1 K/uL  BASIC METABOLIC PANEL      Result Value Range   Sodium 142  135 - 145 mEq/L   Potassium 3.5  3.5 - 5.1 mEq/L    Chloride 103  96 - 112 mEq/L   CO2 29  19 - 32 mEq/L   Glucose, Bld 173 (*) 70 - 99 mg/dL   BUN 31 (*) 6 - 23 mg/dL   Creatinine, Ser 9.62 (*) 0.50 - 1.10 mg/dL   Calcium 9.4  8.4 - 95.2 mg/dL   GFR calc  non Af Amer 36 (*) >90 mL/min   GFR calc Af Amer 42 (*) >90 mL/min  TROPONIN I      Result Value Range   Troponin I <0.30  <0.30 ng/mL  URINALYSIS, ROUTINE W REFLEX MICROSCOPIC      Result Value Range   Color, Urine YELLOW  YELLOW   APPearance CLEAR  CLEAR   Specific Gravity, Urine >1.030 (*) 1.005 - 1.030   pH 5.5  5.0 - 8.0   Glucose, UA NEGATIVE  NEGATIVE mg/dL   Hgb urine dipstick NEGATIVE  NEGATIVE   Bilirubin Urine NEGATIVE  NEGATIVE   Ketones, ur NEGATIVE  NEGATIVE mg/dL   Protein, ur 295 (*) NEGATIVE mg/dL   Urobilinogen, UA 0.2  0.0 - 1.0 mg/dL   Nitrite NEGATIVE  NEGATIVE   Leukocytes, UA NEGATIVE  NEGATIVE  URINE MICROSCOPIC-ADD ON      Result Value Range   Squamous Epithelial / LPF FEW (*) RARE   WBC, UA 3-6  <3 WBC/hpf   Bacteria, UA FEW (*) RARE   Casts HYALINE CASTS (*) NEGATIVE   Ct Head Wo Contrast 01/04/2013  *RADIOLOGY REPORT*  Clinical Data: History of non malignant brain tumor removal.  Near syncopal episode.  CT HEAD WITHOUT CONTRAST  Technique:  Contiguous axial images were obtained from the base of the skull through the vertex without contrast.  Comparison: None.  Findings: The bifrontal encephalomalacia related to a remote craniotomy, likely for meningioma removal.  No acute stroke, hemorrhage, mass lesion, hydrocephalus, or extra-axial fluid. Remote right basal ganglia infarct.  Moderate vascular calcification.  Hyperostosis.  Unremarkable craniotomy defect. Clear sinuses and mastoids.  IMPRESSION: Postsurgical and post ischemic changes as described.  No acute intracranial findings.   Original Report Authenticated By: Davonna Belling, M.D.    Dg Chest Portable 1 View 01/04/2013  *RADIOLOGY REPORT*  Clinical Data: Near syncope.  PORTABLE CHEST - 1 VIEW   Comparison: 05/21/2008  Findings: Numerous leads and wires project over the chest.  Midline trachea.  Moderate cardiomegaly.  Tortuous descending thoracic aorta. No pleural effusion or pneumothorax.  No congestive failure.  Clear lungs.  IMPRESSION: Cardiomegaly without congestive failure.   Original Report Authenticated By: Jeronimo Greaves, M.D.   Results for TIOMBE, TOMEO (MRN [id number]) as of 01/04/2013 17:21  Ref. Range 05/26/2008 03:55 05/30/2008 03:30 01/04/2013 11:29  BUN Latest Range: 6-23 mg/dL 14 11 31  (H)  Creatinine Latest Range: 0.50-1.10 mg/dL 8.46 9.62 9.52 (H)    8413:  Pt with new onset afib, continues rate controlled while in the ED. +orthostatic on VS, c/o near syncope.  Denies CP/palpitations. BUN/Cr elevated from previous.  Will give judicious IVF.  No clear UTI on Udip; UC pending. Dx and testing d/w pt and family.  Questions answered.  Verb understanding, agreeable to admit. T/C to Dr. Felecia Shelling, case discussed, including:  HPI, pertinent PM/SHx, VS/PE, dx testing, ED course and treatment:  Agreeable to observation admit, requests to write temporary orders, obtain tele bed.       Laray Anger, DO 01/05/13 1416

## 2013-01-05 ENCOUNTER — Observation Stay (HOSPITAL_COMMUNITY): Payer: Medicare Other

## 2013-01-05 LAB — BASIC METABOLIC PANEL
CO2: 27 mEq/L (ref 19–32)
Calcium: 9 mg/dL (ref 8.4–10.5)
GFR calc Af Amer: 42 mL/min — ABNORMAL LOW (ref >90)
Sodium: 142 mEq/L (ref 135–145)

## 2013-01-05 LAB — TROPONIN I: Troponin I: <0.3 ng/mL (ref <0.30)

## 2013-01-05 MED ORDER — POTASSIUM CHLORIDE 10 MEQ/100ML IV SOLN
10.0000 meq | INTRAVENOUS | Status: AC
  Administered 2013-01-05 (×3): 10 meq via INTRAVENOUS
  Filled 2013-01-05: qty 300

## 2013-01-05 MED ORDER — AMLODIPINE BESYLATE 5 MG PO TABS
10.0000 mg | ORAL_TABLET | Freq: Every day | ORAL | Status: DC
  Administered 2013-01-05 – 2013-01-07 (×3): 10 mg via ORAL
  Filled 2013-01-05 (×3): qty 2

## 2013-01-05 MED ORDER — LABETALOL HCL 200 MG PO TABS
300.0000 mg | ORAL_TABLET | Freq: Two times a day (BID) | ORAL | Status: DC
  Administered 2013-01-05 – 2013-01-07 (×4): 300 mg via ORAL
  Filled 2013-01-05 (×3): qty 2

## 2013-01-05 MED ORDER — LABETALOL HCL 200 MG PO TABS
300.0000 mg | ORAL_TABLET | Freq: Two times a day (BID) | ORAL | Status: DC
  Filled 2013-01-05: qty 2

## 2013-01-05 MED ORDER — SODIUM CHLORIDE 0.9 % IV SOLN
INTRAVENOUS | Status: AC
  Administered 2013-01-05: 10:00:00 via INTRAVENOUS

## 2013-01-05 NOTE — Progress Notes (Signed)
Pts BP 154/115 and pulse 81 this afternoon.  MD notified and orders given to start norvasc and labetalol back as taken at home.  Pt given norvasc at this time.  Will re check BP and continue to monitor.

## 2013-01-05 NOTE — H&P (Signed)
[name] MRN: [id number] DOB/AGE: November 12, 1930 79 y.o. 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 She almost lost her consciousness. Her B/P was elevated at lying position. EMS was called and patient was transferred to ER. She was evaluated in ER. Her CT Scan of the head was negative for acute changes. Her cardiac enzymes were negative. She EKG showed atrial fibrillation but the rate was controlled. Her BUN and cr was elevated. IV fluid was started patient was admitted for further evaluation. No fever, chills, cough,chest pain,nausea, vomiting, abdominal pain, dysuria, urgency or frequency of urination.  Past Medical History  Diagnosis Date  . Hypercholesteremia   . Coronary artery disease   . PVD (peripheral vascular disease)   . Hypertension   . DVT (deep venous thrombosis) 05/2008  . Bilateral iliac artery occlusion   . Aortic valve sclerosis    Past Surgical History  Procedure Laterality Date  . Cardiac surgery    . Iliac artery stent Left 05/2008    Dr. Allyson Holden  . Iliac artery stent Right 2010    Dr. Allyson Holden  . Brain tumor excision  1999    for hemangioma [facility])        No family history on file.  Social History:  reports that she has been smoking Cigarettes.  She has been smoking about 0.00 packs per day. She does not have any smokeless tobacco history on file. She reports that she does not drink alcohol or use illicit drugs.  Allergies: No Known Allergies  Medications Prior to Admission  Medication Sig Dispense Refill  . amLODipine (NORVASC) 10 MG tablet Take 10 mg by mouth daily.      Marland Kitchen aspirin EC 81 MG tablet Take 81 mg by mouth daily.      . clopidogrel (PLAVIX) 75 MG tablet Take 75 mg by  mouth daily.      Marland Kitchen ibuprofen (ADVIL,MOTRIN) 600 MG tablet Take 600 mg by mouth every 6 (six) hours as needed for pain.      Marland Kitchen labetalol (NORMODYNE) 300 MG tablet Take 300 mg by mouth 2 (two) times daily.      . quinapril-hydrochlorothiazide (ACCURETIC) 20-25 MG per tablet Take 1 tablet by mouth daily.      . simvastatin (ZOCOR) 20 MG tablet Take 20 mg by mouth every evening.           BJY:NWGNF from the symptoms mentioned above,there are no other symptoms referable to all systems reviewed.  Physical Exam: Blood pressure 188/91, pulse 75, temperature 98.2 F (36.8 C), temperature source Oral, resp. rate 20, height 5' 7.5" (1.715 m), weight 82.1 kg (181 lb), SpO2 95.00%.  General Condition- alert, awake and sick looking HE ENT- pupils equal and reactive, neck supple. Respiratory- clear lung field CVS- S1 and S2 heard, no murmur or gallop ABD- soft and lax, bowel sound+ EXT- no leg edema    Recent Labs  01/04/13 1129  WBC 6.8  NEUTROABS 4.7  HGB 14.5  HCT 45.1  MCV 86.7  PLT 205    Recent Labs  01/04/13 1129 01/05/13 0201  NA 142 142  K 3.5 3.1*  CL 103 105  CO2 29 27  GLUCOSE 173* 84  BUN 31* 29*  CREATININE 1.36* 1.36*  CALCIUM 9.4 9.0  lablast2(ast:2,ALT:2,alkphos:2,bilitot:2,prot:2,albumin:2)@    No results found for this or any previous visit (from the past 240 hour(s)).   Ct Head Wo Contrast  01/04/2013  *RADIOLOGY REPORT*  Clinical Data: History of non malignant brain tumor removal.  Near syncopal episode.  CT HEAD WITHOUT CONTRAST  Technique:  Contiguous axial images were obtained from the base of the skull through the vertex without contrast.  Comparison: None.  Findings: The bifrontal encephalomalacia related to a remote craniotomy, likely for meningioma removal.  No acute stroke, hemorrhage, mass lesion, hydrocephalus, or extra-axial fluid. Remote right basal ganglia infarct.  Moderate vascular calcification.  Hyperostosis.  Unremarkable craniotomy  defect. Clear sinuses and mastoids.  IMPRESSION: Postsurgical and post ischemic changes as described.  No acute intracranial findings.   Original Report Authenticated By: Davonna Belling, M.D.    Dg Chest Portable 1 View  01/04/2013  *RADIOLOGY REPORT*  Clinical Data: Near syncope.  PORTABLE CHEST - 1 VIEW  Comparison: 05/21/2008  Findings: Numerous leads and wires project over the chest.  Midline trachea.  Moderate cardiomegaly.  Tortuous descending thoracic aorta. No pleural effusion or pneumothorax.  No congestive failure.  Clear lungs.  IMPRESSION: Cardiomegaly without congestive failure.   Original Report Authenticated By: Jeronimo Greaves, M.D.    Impression:  Active Problems:   * No active hospital problems. *  1.Presyncopal episode 2. Orthostatic hypotension 3. Acute renal failure probably secondary to dehydration 4. H/O hypertension 5.H/O PVD and S/P iliac artery stent placement   Plan: Continue telemetry Cardiac enzyme and EKG Cardiology consult Echo and carotid doppler Continue IV hydration/     [name] Pager [phone]  01/05/2013, 8:10 AM

## 2013-01-05 NOTE — Progress Notes (Signed)
*  PRELIMINARY RESULTS* Echocardiogram 2D Echocardiogram has been performed.  Conrad Emison 01/05/2013, 10:21 AM

## 2013-01-05 NOTE — Progress Notes (Signed)
Subjective:  Patient was admitted yesterday due to presyncopal episode. She feels better today. She is being rehydrated. No chest pain.  Objective: Vital signs in last 24 hours: Temp:  [97.7 F (36.5 C)-98.4 F (36.9 C)] 98.2 F (36.8 C) (02/28 0431) Pulse Rate:  [48-94] 75 (02/28 0431) Resp:  [16-23] 20 (02/28 0431) BP: (147-197)/(73-109) 188/91 mmHg (02/28 0431) SpO2:  [93 %-98 %] 95 % (02/28 0431) Weight:  [82.1 kg (181 lb)] 82.1 kg (181 lb) (02/27 2116) Weight change:  Last BM Date: 01/04/13  Intake/Output from previous day: 02/27 0701 - 02/28 0700 In: 240 [P.O.:240] Out: -   PHYSICAL EXAM General appearance: alert and no distress Resp: clear to auscultation bilaterally Cardio: regular rate and rhythm, irregularly irregular rhythm and S1, S2 normal GI: soft, non-tender; bowel sounds normal; no masses,  no organomegaly Extremities: extremities normal, atraumatic, no cyanosis or edema and edema no leg edema  Lab Results:    @labtest @ ABGS No results found for this basename: PHART, PCO2, PO2ART, TCO2, HCO3,  in the last 72 hours CULTURES No results found for this or any previous visit (from the past 240 hour(s)). Studies/Results: Ct Head Wo Contrast  01/04/2013  *RADIOLOGY REPORT*  Clinical Data: History of non malignant brain tumor removal.  Near syncopal episode.  CT HEAD WITHOUT CONTRAST  Technique:  Contiguous axial images were obtained from the base of the skull through the vertex without contrast.  Comparison: None.  Findings: The bifrontal encephalomalacia related to a remote craniotomy, likely for meningioma removal.  No acute stroke, hemorrhage, mass lesion, hydrocephalus, or extra-axial fluid. Remote right basal ganglia infarct.  Moderate vascular calcification.  Hyperostosis.  Unremarkable craniotomy defect. Clear sinuses and mastoids.  IMPRESSION: Postsurgical and post ischemic changes as described.  No acute intracranial findings.   Original Report Authenticated  By: Davonna Belling, M.D.    Dg Chest Portable 1 View  01/04/2013  *RADIOLOGY REPORT*  Clinical Data: Near syncope.  PORTABLE CHEST - 1 VIEW  Comparison: 05/21/2008  Findings: Numerous leads and wires project over the chest.  Midline trachea.  Moderate cardiomegaly.  Tortuous descending thoracic aorta. No pleural effusion or pneumothorax.  No congestive failure.  Clear lungs.  IMPRESSION: Cardiomegaly without congestive failure.   Original Report Authenticated By: Jeronimo Greaves, M.D.     Medications: I have reviewed the patient's current medications.  Assesment: 1.Presyncopal episode  2. Orthostatic hypotension  3. Acute renal failure probably secondary to dehydration  4. H/O hypertension  5.H/O PVD and S/P iliac artery stent placement     Active Problems:   * No active hospital problems. *    Plan:  Continue telemetry  Cardiac enzyme and EKG  Cardiology consult  Echo and carotid doppler  Continue IV hydration    LOS: 1 day   Sharon Stapel 01/05/2013, 8:51 AM

## 2013-01-05 NOTE — Progress Notes (Signed)
UR Chart Review Completed  

## 2013-01-05 NOTE — Progress Notes (Signed)
ANTICOAGULATION CONSULT NOTE - Initial Consult  Pharmacy Consult for Lovenox Indication: VTE prophylaxis  No Known Allergies  Patient Measurements: Height: 5' 7.5" (171.5 cm) Weight: 181 lb (82.1 kg) (82.1kg) IBW/kg (Calculated) : 62.75  Vital Signs: Temp: 98.2 F (36.8 C) (02/28 0431) Temp src: Oral (02/28 0431) BP: 188/91 mmHg (02/28 0431) Pulse Rate: 75 (02/28 0431)  Labs:  Recent Labs  01/04/13 1129 01/04/13 2018 01/05/13 0201 01/05/13 0809  HGB 14.5  --   --   --   HCT 45.1  --   --   --   PLT 205  --   --   --   CREATININE 1.36*  --  1.36*  --   TROPONINI <0.30 <0.30 <0.30 <0.30   Estimated Creatinine Clearance: 37.3 ml/min (by C-G formula based on Cr of 1.36).  Medical History: Past Medical History  Diagnosis Date  . Hypercholesteremia   . Coronary artery disease   . PVD (peripheral vascular disease)   . Hypertension   . DVT (deep venous thrombosis) 05/2008  . Bilateral iliac artery occlusion   . Aortic valve sclerosis    Medications:  Scheduled:  . sodium chloride   Intravenous STAT  . sodium chloride   Intravenous STAT  . aspirin  81 mg Oral Daily  . enoxaparin (LOVENOX) injection  40 mg Subcutaneous Q24H  . potassium chloride  10 mEq Intravenous Q1 Hr x 3   Assessment: 79yo female admitted due to syncopal episode.  Lovenox ordered for VTE prophylaxis.  ClCr > 30.  Goal of Therapy:  VTE prophylaxis Monitor platelets by anticoagulation protocol: Yes   Plan:  Lovenox 40mg  SQ q24hrs CBC per protocol  Valrie Hart A 01/05/2013,9:13 AM

## 2013-01-05 NOTE — Care Management Note (Signed)
    Page 1 of 1   01/05/2013     1:17:08 PM   CARE MANAGEMENT NOTE 01/05/2013  Patient:  [name], [name]   Account Number:  [id number]  Date Initiated:  01/05/2013  Documentation initiated by:  Sharrie Rothman  Subjective/Objective Assessment:   Pt admitted from home with near syncope. Pt lives alone but has 2 brothers who are very active in the care of the pt. Pt is independent with ADL's.     Action/Plan:   No CM needs identified.   Anticipated DC Date:  01/06/2013   Anticipated DC Plan:  HOME/SELF CARE      DC Planning Services  CM consult      Choice offered to / List presented to:             Status of service:  Completed, signed off Medicare Important Message given?   (If response is "NO", the following Medicare IM given date fields will be blank) Date Medicare IM given:   Date Additional Medicare IM given:    Discharge Disposition:  HOME/SELF CARE  Per UR Regulation:    If discussed at Long Length of Stay Meetings, dates discussed:    Comments:  01/05/13 1315 Arlyss Queen, RN BSN CM

## 2013-01-05 NOTE — Progress Notes (Signed)
Called late Friday afternoon for a consult on [name]. We do not have weekend coverage at [facility].  I have read her echocardiogram and there is severe concentric hypertrophy and signs of probably hypertensive heart disease with a severely dilated left atrium. Given her risk factors, she will likely need to be anticoagulated for stroke prevention.  I will defer the choice of anticoagulation to you based on her risk factors and whether she has any contraindications to warfarin or an alternative such as xarelto. She could follow-up in our office next week. I will have the office contact her for an appointment after she is discharged.  I'm on call this weekend - feel free to page me through the answering service with questions.  Chrystie Nose, MD, [facility] Attending Cardiologist [facility]

## 2013-01-06 LAB — BASIC METABOLIC PANEL
BUN: 17 mg/dL (ref 6–23)
Calcium: 8.8 mg/dL (ref 8.4–10.5)
Creatinine, Ser: 1.16 mg/dL — ABNORMAL HIGH (ref 0.50–1.10)
GFR calc Af Amer: 51 mL/min — ABNORMAL LOW (ref >90)
GFR calc non Af Amer: 44 mL/min — ABNORMAL LOW (ref >90)
Glucose, Bld: 101 mg/dL — ABNORMAL HIGH (ref 70–99)
Potassium: 3.7 mEq/L (ref 3.5–5.1)

## 2013-01-06 LAB — URINE CULTURE: Culture: NO GROWTH

## 2013-01-06 MED ORDER — RIVAROXABAN 10 MG PO TABS
20.0000 mg | ORAL_TABLET | Freq: Every day | ORAL | Status: DC
  Administered 2013-01-06 – 2013-01-07 (×2): 20 mg via ORAL
  Filled 2013-01-06 (×2): qty 2

## 2013-01-06 MED ORDER — CLONIDINE HCL 0.1 MG PO TABS
0.1000 mg | ORAL_TABLET | Freq: Two times a day (BID) | ORAL | Status: AC
  Administered 2013-01-06 (×2): 0.1 mg via ORAL
  Filled 2013-01-06 (×2): qty 1

## 2013-01-06 NOTE — Progress Notes (Signed)
ANTICOAGULATION CONSULT NOTE   Pharmacy Consult for Lovenox Indication: VTE prophylaxis  No Known Allergies  Patient Measurements: Height: 5' 7.5" (171.5 cm) Weight: 185 lb 13.6 oz (84.3 kg) IBW/kg (Calculated) : 62.75  Vital Signs: Temp: 97.8 F (36.6 C) (03/01 0749) Temp src: Oral (03/01 0749) BP: 188/93 mmHg (03/01 0749) Pulse Rate: 80 (03/01 0749)  Labs:  Recent Labs  01/04/13 1129 01/04/13 2018 01/05/13 0201 01/05/13 0809 01/06/13 0522  HGB 14.5  --   --   --   --   HCT 45.1  --   --   --   --   PLT 205  --   --   --   --   CREATININE 1.36*  --  1.36*  --  1.16*  TROPONINI <0.30 <0.30 <0.30 <0.30  --    Estimated Creatinine Clearance: 44.3 ml/min (by C-G formula based on Cr of 1.16).  Medical History: Past Medical History  Diagnosis Date  . Hypercholesteremia   . Coronary artery disease   . PVD (peripheral vascular disease)   . Hypertension   . DVT (deep venous thrombosis) 05/2008  . Bilateral iliac artery occlusion   . Aortic valve sclerosis    Medications:  Scheduled:  . [COMPLETED] sodium chloride   Intravenous STAT  . [COMPLETED] sodium chloride   Intravenous STAT  . amLODipine  10 mg Oral Daily  . aspirin  81 mg Oral Daily  . cloNIDine  0.1 mg Oral BID  . enoxaparin (LOVENOX) injection  40 mg Subcutaneous Q24H  . labetalol  300 mg Oral BID  . [COMPLETED] potassium chloride  10 mEq Intravenous Q1 Hr x 3  . [DISCONTINUED] labetalol  300 mg Oral BID   Assessment: 79yo female admitted due to syncopal episode.  Lovenox ordered for VTE prophylaxis.  SCr improved.  Estimated Creatinine Clearance: 44.3 ml/min (by C-G formula based on Cr of 1.16).  Goal of Therapy:  VTE prophylaxis Monitor platelets by anticoagulation protocol: Yes   Plan:  Lovenox 40mg  SQ q24hrs Pharmacy will sign off.  Margo Aye, Azrielle Springsteen A 01/06/2013,8:58 AM

## 2013-01-06 NOTE — Progress Notes (Signed)
Subjective:  Patient is resting. Her Echo was reviewed  By her cardiologist and advised to start on anticoagulation for her atrial fibrillation  Objective: Vital signs in last 24 hours: Temp:  [97.5 F (36.4 C)-98.4 F (36.9 C)] 97.8 F (36.6 C) (03/01 0749) Pulse Rate:  [60-82] 80 (03/01 0749) Resp:  [20-22] 20 (03/01 0749) BP: (152-209)/(76-121) 188/93 mmHg (03/01 0749) SpO2:  [92 %-100 %] 92 % (03/01 0749) Weight:  [84.3 kg (185 lb 13.6 oz)] 84.3 kg (185 lb 13.6 oz) (03/01 0505) Weight change: 2.2 kg (4 lb 13.6 oz) Last BM Date: 01/04/13  Intake/Output from previous day: 02/28 0701 - 03/01 0700 In: 738.8 [P.O.:240; I.V.:498.8] Out: -   PHYSICAL EXAM General appearance: alert and no distress Resp: clear to auscultation bilaterally Cardio: regular rate and rhythm, irregularly irregular rhythm and S1, S2 normal GI: soft, non-tender; bowel sounds normal; no masses,  no organomegaly Extremities: extremities normal, atraumatic, no cyanosis or edema and edema no leg edema  Lab Results:    @labtest @ [facility] No results found for this basename: PHART, PCO2, PO2ART, TCO2, HCO3,  in the last 72 hours CULTURES Recent Results (from the past 240 hour(s))  URINE CULTURE     Status: None   Collection Time    01/04/13  2:44 PM      Result Value Range Status   Specimen Description URINE, CLEAN CATCH   Final   Special Requests NONE   Final   Culture  Setup Time 01/05/2013 01:43   Final   Colony Count NO GROWTH   Final   Culture NO GROWTH   Final   Report Status 01/06/2013 FINAL   Final   Studies/Results: Ct Head Wo Contrast  01/04/2013  *RADIOLOGY REPORT*  Clinical Data: History of non malignant brain tumor removal.  Near syncopal episode.  CT HEAD WITHOUT CONTRAST  Technique:  Contiguous axial images were obtained from the base of the skull through the vertex without contrast.  Comparison: None.  Findings: The bifrontal encephalomalacia related to a remote craniotomy, likely for  meningioma removal.  No acute stroke, hemorrhage, mass lesion, hydrocephalus, or extra-axial fluid. Remote right basal ganglia infarct.  Moderate vascular calcification.  Hyperostosis.  Unremarkable craniotomy defect. Clear sinuses and mastoids.  IMPRESSION: Postsurgical and post ischemic changes as described.  No acute intracranial findings.   Original Report Authenticated By: Davonna Belling, M.D.    US Carotid Duplex Bilateral  01/05/2013  *RADIOLOGY REPORT*  Clinical Data: Syncope, hypertension, coronary artery disease, tobacco use.  BILATERAL CAROTID DUPLEX ULTRASOUND  Technique: Wallace Cullens scale imaging, color Doppler and duplex ultrasound was performed of bilateral carotid and vertebral arteries in the neck.  Comparison: None available  Criteria:  Quantification of carotid stenosis is based on velocity parameters that correlate the residual internal carotid diameter with NASCET-based stenosis levels, using the diameter of the distal internal carotid lumen as the denominator for stenosis measurement.  The following velocity measurements were obtained:                   PEAK SYSTOLIC/END DIASTOLIC RIGHT ICA:                        58/16cm/sec CCA:                        57/12cm/sec SYSTOLIC ICA/CCA RATIO:     1.02 DIASTOLIC ICA/CCA RATIO:    1.4 ECA:  76cm/sec  LEFT ICA:                        59/17cm/sec CCA:                        54/9cm/sec SYSTOLIC ICA/CCA RATIO:     1.09 DIASTOLIC ICA/CCA RATIO:    1.81 ECA:                        53cm/sec  Findings:  RIGHT CAROTID ARTERY: Eccentric calcified plaque in the distal common carotid artery and bulb without high-grade stenosis.  There is eccentric plaque extending into the lumen of the carotid bulb and proximal ECA.  Normal wave forms and color Doppler signal.  RIGHT VERTEBRAL ARTERY:  Normal flow direction and waveform.  LEFT CAROTID ARTERY: Eccentric calcified nonocclusive plaque in the mid common carotid artery.  There is partially calcified  plaque in the carotid bifurcation extending to the proximal ICA without high- grade stenosis.  Normal wave forms and color Doppler signal.  LEFT VERTEBRAL ARTERY:  Normal flow direction and waveform.  IMPRESSION:  1.  Bilateral eccentric calcified carotid bifurcation plaque, resulting in less than 50% diameter stenosis. The exam does not exclude plaque ulceration or embolization.  Continued surveillance recommended.   Original Report Authenticated By: D. Andria Rhein, MD    Dg Chest Portable 1 View  01/04/2013  *RADIOLOGY REPORT*  Clinical Data: Near syncope.  PORTABLE CHEST - 1 VIEW  Comparison: 05/21/2008  Findings: Numerous leads and wires project over the chest.  Midline trachea.  Moderate cardiomegaly.  Tortuous descending thoracic aorta. No pleural effusion or pneumothorax.  No congestive failure.  Clear lungs.  IMPRESSION: Cardiomegaly without congestive failure.   Original Report Authenticated By: Jeronimo Greaves, M.D.     Medications: I have reviewed the patient's current medications.  Assesment: 1.Presyncopal episode  2. Orthostatic hypotension  3. Acute renal failure probably secondary to dehydration  4. H/O hypertension  5.H/O PVD and S/P iliac artery stent placement     Active Problems:   * No active hospital problems. *    Plan:  We will start on rivaroxaban  20 mg po qhs Continue current treatmwent CBC/BMP in AM    LOS: 2 days   Kron Everton 01/06/2013, 10:49 AM

## 2013-01-07 LAB — CBC
Hemoglobin: 12.5 g/dL (ref 12.0–15.0)
MCHC: 32.6 g/dL (ref 30.0–36.0)
Platelets: 187 10*3/uL (ref 150–400)
RDW: 13.9 % (ref 11.5–15.5)

## 2013-01-07 MED ORDER — RIVAROXABAN 20 MG PO TABS: 20.0000 mg | ORAL_TABLET | Freq: Every day | ORAL | Status: DC

## 2013-01-07 NOTE — Progress Notes (Signed)
Xarelto education complete.  Gave patient handout and explained key points to watch for while taking Xarelto.  Scharlene Gloss, PharmD

## 2013-01-07 NOTE — Progress Notes (Signed)
Pt was discharged with prescriptions, prescriptions and care notes.  She verbalized understanding and left the floor via w/c with staff in stable condition.

## 2013-01-07 NOTE — Discharge Summary (Signed)
Physician Discharge Summary  Patient ID: [name] MRN: [id number] DOB/AGE: 1931-06-09 79 y.o. Primary Care Physician:FANTA,[name], MD Admit date: 01/04/2013 Discharge date: 01/07/2013    Discharge Diagnoses:  1.Presyncopal episode  2. Orthostatic hypotension  3. Acute renal failure probably secondary to dehydration  4. H/O hypertension  5.H/O PVD and S/P iliac artery stent placement  6. Atrial fibrillation      Active Problems:   * No active hospital problems. *     Medication List    TAKE these medications       amLODipine 10 MG tablet  Commonly known as:  NORVASC  Take 10 mg by mouth daily.     aspirin EC 81 MG tablet  Take 81 mg by mouth daily.     clopidogrel 75 MG tablet  Commonly known as:  PLAVIX  Take 75 mg by mouth daily.     ibuprofen 600 MG tablet  Commonly known as:  ADVIL,MOTRIN  Take 600 mg by mouth every 6 (six) hours as needed for pain.     labetalol 300 MG tablet  Commonly known as:  NORMODYNE  Take 300 mg by mouth 2 (two) times daily.     quinapril-hydrochlorothiazide 20-25 MG per tablet  Commonly known as:  ACCURETIC  Take 1 tablet by mouth daily.     Rivaroxaban 20 MG Tabs  Commonly known as:  XARELTO  Take 1 tablet (20 mg total) by mouth daily with supper.     simvastatin 20 MG tablet  Commonly known as:  ZOCOR  Take 20 mg by mouth every evening.        Discharged Condition: improved    Consults: cardiology  Significant Diagnostic Studies: Ct Head Wo Contrast  01/04/2013  *RADIOLOGY REPORT*  Clinical Data: History of non malignant brain tumor removal.  Near syncopal episode.  CT HEAD WITHOUT CONTRAST  Technique:  Contiguous axial images were obtained from the base of the skull through the vertex without contrast.  Comparison: None.  Findings: The bifrontal encephalomalacia related to a remote craniotomy, likely for meningioma removal.  No acute stroke, hemorrhage, mass lesion, hydrocephalus, or extra-axial fluid.  Remote right basal ganglia infarct.  Moderate vascular calcification.  Hyperostosis.  Unremarkable craniotomy defect. Clear sinuses and mastoids.  IMPRESSION: Postsurgical and post ischemic changes as described.  No acute intracranial findings.   Original Report Authenticated By: Davonna Belling, M.D.    US Carotid Duplex Bilateral  01/05/2013  *RADIOLOGY REPORT*  Clinical Data: Syncope, hypertension, coronary artery disease, tobacco use.  BILATERAL CAROTID DUPLEX ULTRASOUND  Technique: Wallace Cullens scale imaging, color Doppler and duplex ultrasound was performed of bilateral carotid and vertebral arteries in the neck.  Comparison: None available  Criteria:  Quantification of carotid stenosis is based on velocity parameters that correlate the residual internal carotid diameter with NASCET-based stenosis levels, using the diameter of the distal internal carotid lumen as the denominator for stenosis measurement.  The following velocity measurements were obtained:                   PEAK SYSTOLIC/END DIASTOLIC RIGHT ICA:                        58/16cm/sec CCA:                        57/12cm/sec SYSTOLIC ICA/CCA RATIO:     1.02 DIASTOLIC ICA/CCA RATIO:    1.4 ECA:  76cm/sec  LEFT ICA:                        59/17cm/sec CCA:                        54/9cm/sec SYSTOLIC ICA/CCA RATIO:     1.09 DIASTOLIC ICA/CCA RATIO:    1.81 ECA:                        53cm/sec  Findings:  RIGHT CAROTID ARTERY: Eccentric calcified plaque in the distal common carotid artery and bulb without high-grade stenosis.  There is eccentric plaque extending into the lumen of the carotid bulb and proximal ECA.  Normal wave forms and color Doppler signal.  RIGHT VERTEBRAL ARTERY:  Normal flow direction and waveform.  LEFT CAROTID ARTERY: Eccentric calcified nonocclusive plaque in the mid common carotid artery.  There is partially calcified plaque in the carotid bifurcation extending to the proximal ICA without high- grade stenosis.  Normal  wave forms and color Doppler signal.  LEFT VERTEBRAL ARTERY:  Normal flow direction and waveform.  IMPRESSION:  1.  Bilateral eccentric calcified carotid bifurcation plaque, resulting in less than 50% diameter stenosis. The exam does not exclude plaque ulceration or embolization.  Continued surveillance recommended.   Original Report Authenticated By: D. Andria Rhein, MD    Dg Chest Portable 1 View  01/04/2013  *RADIOLOGY REPORT*  Clinical Data: Near syncope.  PORTABLE CHEST - 1 VIEW  Comparison: 05/21/2008  Findings: Numerous leads and wires project over the chest.  Midline trachea.  Moderate cardiomegaly.  Tortuous descending thoracic aorta. No pleural effusion or pneumothorax.  No congestive failure.  Clear lungs.  IMPRESSION: Cardiomegaly without congestive failure.   Original Report Authenticated By: Jeronimo Greaves, M.D.     Lab Results: Basic Metabolic Panel:  Recent Labs  16/10/96 0201 01/06/13 0522  NA 142 140  K 3.1* 3.7  CL 105 105  CO2 27 23  GLUCOSE 84 101*  BUN 29* 17  CREATININE 1.36* 1.16*  CALCIUM 9.0 8.8   Liver Function Tests: No results found for this basename: AST, ALT, ALKPHOS, BILITOT, PROT, ALBUMIN,  in the last 72 hours   CBC:  Recent Labs  01/04/13 1129 01/07/13 0458  WBC 6.8 6.3  NEUTROABS 4.7  --   HGB 14.5 12.5  HCT 45.1 38.4  MCV 86.7 86.3  PLT 205 187    Recent Results (from the past 240 hour(s))  URINE CULTURE     Status: None   Collection Time    01/04/13  2:44 PM      Result Value Range Status   Specimen Description URINE, CLEAN CATCH   Final   Special Requests NONE   Final   Culture  Setup Time 01/05/2013 01:43   Final   Colony Count NO GROWTH   Final   Culture NO GROWTH   Final   Report Status 01/06/2013 FINAL   Final     Hospital Course:  This is a 79 years old female patient with history of multiple medical illnesses was admitted due to presyncopal episode. Patient was found atrial fibrillation with controlled rate, renal failure  and orthostatic hypotension. She was hydrated, Echo and cardiac enyzemes  Was monitored. Patient was started on xarelto  20 mg po daly according cardiology recommendation. She is discharged in stable conation. She will be followed in out patient by cariology.  Discharge Exam:  Blood pressure 155/94, pulse 72, temperature 97.4 F (36.3 C), temperature source Oral, resp. rate 20, height 5' 7.5" (1.715 m), weight 84.3 kg (185 lb 13.6 oz), SpO2 97.00%.   Disposition:  home       Future Appointments Provider Department Dept Phone   01/26/2013 10:20 AM [facility] [place] [phone]   Patient should wear two piece clothing and wear no powder or deodorant. Patient should arrive 15 minutes early.        Signed: FANTA,[name]  01/07/2013, 9:15 AM

## 2013-01-10 ENCOUNTER — Inpatient Hospital Stay (HOSPITAL_COMMUNITY)
Admission: EM | Admit: 2013-01-10 | Discharge: 2013-01-15 | DRG: 069 | Disposition: A | Discharge status: Home / Self Care | Payer: Medicare Other | Attending: Internal Medicine | Admitting: Internal Medicine

## 2013-01-10 ENCOUNTER — Encounter (HOSPITAL_COMMUNITY): Payer: Self-pay

## 2013-01-10 ENCOUNTER — Observation Stay (HOSPITAL_COMMUNITY): Payer: Medicare Other

## 2013-01-10 ENCOUNTER — Emergency Department (HOSPITAL_COMMUNITY): Payer: Medicare Other

## 2013-01-10 DIAGNOSIS — IMO0001 Reserved for inherently not codable concepts without codable children: Secondary | ICD-10-CM

## 2013-01-10 DIAGNOSIS — I10 Essential (primary) hypertension: Secondary | ICD-10-CM | POA: Diagnosis present

## 2013-01-10 DIAGNOSIS — J4489 Other specified chronic obstructive pulmonary disease: Secondary | ICD-10-CM | POA: Diagnosis present

## 2013-01-10 DIAGNOSIS — I4891 Unspecified atrial fibrillation: Secondary | ICD-10-CM | POA: Diagnosis present

## 2013-01-10 DIAGNOSIS — E7800 Pure hypercholesterolemia, unspecified: Secondary | ICD-10-CM | POA: Diagnosis present

## 2013-01-10 DIAGNOSIS — I63032 Cerebral infarction due to thrombosis of left carotid artery: Secondary | ICD-10-CM

## 2013-01-10 DIAGNOSIS — I739 Peripheral vascular disease, unspecified: Secondary | ICD-10-CM | POA: Diagnosis present

## 2013-01-10 DIAGNOSIS — Z7982 Long term (current) use of aspirin: Secondary | ICD-10-CM

## 2013-01-10 DIAGNOSIS — I2510 Atherosclerotic heart disease of native coronary artery without angina pectoris: Secondary | ICD-10-CM | POA: Diagnosis present

## 2013-01-10 DIAGNOSIS — F17200 Nicotine dependence, unspecified, uncomplicated: Secondary | ICD-10-CM | POA: Diagnosis present

## 2013-01-10 DIAGNOSIS — Z79899 Other long term (current) drug therapy: Secondary | ICD-10-CM

## 2013-01-10 DIAGNOSIS — G459 Transient cerebral ischemic attack, unspecified: Principal | ICD-10-CM | POA: Diagnosis present

## 2013-01-10 DIAGNOSIS — I350 Nonrheumatic aortic (valve) stenosis: Secondary | ICD-10-CM

## 2013-01-10 DIAGNOSIS — Z7901 Long term (current) use of anticoagulants: Secondary | ICD-10-CM

## 2013-01-10 DIAGNOSIS — Z7902 Long term (current) use of antithrombotics/antiplatelets: Secondary | ICD-10-CM

## 2013-01-10 DIAGNOSIS — J449 Chronic obstructive pulmonary disease, unspecified: Secondary | ICD-10-CM | POA: Diagnosis present

## 2013-01-10 DIAGNOSIS — E785 Hyperlipidemia, unspecified: Secondary | ICD-10-CM | POA: Diagnosis present

## 2013-01-10 DIAGNOSIS — I359 Nonrheumatic aortic valve disorder, unspecified: Secondary | ICD-10-CM | POA: Diagnosis present

## 2013-01-10 DIAGNOSIS — Z86718 Personal history of other venous thrombosis and embolism: Secondary | ICD-10-CM

## 2013-01-10 DIAGNOSIS — I4821 Permanent atrial fibrillation: Secondary | ICD-10-CM

## 2013-01-10 LAB — CBC
MCH: 27.6 pg (ref 26.0–34.0)
MCHC: 32.2 g/dL (ref 30.0–36.0)
MCV: 85.8 fL (ref 78.0–100.0)
Platelets: 188 10*3/uL (ref 150–400)
RDW: 14.1 % (ref 11.5–15.5)
WBC: 6.4 10*3/uL (ref 4.0–10.5)

## 2013-01-10 LAB — DIFFERENTIAL
Basophils Absolute: 0 10*3/uL (ref 0.0–0.1)
Basophils Relative: 1 % (ref 0–1)
Eosinophils Absolute: 0.1 10*3/uL (ref 0.0–0.7)
Eosinophils Relative: 1 % (ref 0–5)

## 2013-01-10 LAB — PROTIME-INR: Prothrombin Time: 14 seconds (ref 11.6–15.2)

## 2013-01-10 LAB — COMPREHENSIVE METABOLIC PANEL
ALT: 23 U/L (ref 0–35)
AST: 17 U/L (ref 0–37)
Calcium: 9.7 mg/dL (ref 8.4–10.5)
Creatinine, Ser: 1.24 mg/dL — ABNORMAL HIGH (ref 0.50–1.10)
Sodium: 142 mEq/L (ref 135–145)
Total Protein: 7.2 g/dL (ref 6.0–8.3)

## 2013-01-10 LAB — URINE MICROSCOPIC-ADD ON

## 2013-01-10 LAB — RAPID URINE DRUG SCREEN, HOSP PERFORMED
Barbiturates: NOT DETECTED
Benzodiazepines: NOT DETECTED
Cocaine: NOT DETECTED
Opiates: NOT DETECTED
Tetrahydrocannabinol: NOT DETECTED

## 2013-01-10 LAB — URINALYSIS, ROUTINE W REFLEX MICROSCOPIC
Bilirubin Urine: NEGATIVE
Hgb urine dipstick: NEGATIVE
Ketones, ur: NEGATIVE mg/dL
Nitrite: NEGATIVE
pH: 5.5 (ref 5.0–8.0)

## 2013-01-10 LAB — ETHANOL: Alcohol, Ethyl (B): <11 mg/dL (ref 0–11)

## 2013-01-10 LAB — POCT I-STAT TROPONIN I: Troponin i, poc: 0 ng/mL (ref 0.00–0.08)

## 2013-01-10 MED ORDER — CLONIDINE HCL 0.1 MG PO TABS
0.1000 mg | ORAL_TABLET | ORAL | Status: DC | PRN
  Administered 2013-01-10 – 2013-01-11 (×3): 0.1 mg via ORAL
  Filled 2013-01-10 (×3): qty 1

## 2013-01-10 MED ORDER — HYDROCHLOROTHIAZIDE 25 MG PO TABS
25.0000 mg | ORAL_TABLET | Freq: Every day | ORAL | Status: DC
  Administered 2013-01-11 – 2013-01-15 (×5): 25 mg via ORAL
  Filled 2013-01-10 (×5): qty 1

## 2013-01-10 MED ORDER — LISINOPRIL 10 MG PO TABS
20.0000 mg | ORAL_TABLET | Freq: Every day | ORAL | Status: DC
  Administered 2013-01-11 – 2013-01-15 (×5): 20 mg via ORAL
  Filled 2013-01-10 (×5): qty 2

## 2013-01-10 MED ORDER — BIOTENE DRY MOUTH MT LIQD
15.0000 mL | Freq: Two times a day (BID) | OROMUCOSAL | Status: DC
  Administered 2013-01-11 – 2013-01-15 (×8): 15 mL via OROMUCOSAL

## 2013-01-10 MED ORDER — WARFARIN SODIUM 7.5 MG PO TABS
7.5000 mg | ORAL_TABLET | Freq: Once | ORAL | Status: AC
  Administered 2013-01-10: 7.5 mg via ORAL
  Filled 2013-01-10: qty 1

## 2013-01-10 MED ORDER — GADOBENATE DIMEGLUMINE 529 MG/ML IV SOLN
8.0000 mL | Freq: Once | INTRAVENOUS | Status: AC | PRN
  Administered 2013-01-10: 8 mL via INTRAVENOUS

## 2013-01-10 MED ORDER — QUINAPRIL-HYDROCHLOROTHIAZIDE 20-25 MG PO TABS
1.0000 | ORAL_TABLET | Freq: Every day | ORAL | Status: DC
Start: 2013-01-10 — End: 2013-01-10

## 2013-01-10 MED ORDER — LABETALOL HCL 200 MG PO TABS
300.0000 mg | ORAL_TABLET | Freq: Two times a day (BID) | ORAL | Status: DC
  Administered 2013-01-10 – 2013-01-15 (×10): 300 mg via ORAL
  Filled 2013-01-10 (×10): qty 2

## 2013-01-10 MED ORDER — ENOXAPARIN SODIUM 80 MG/0.8ML ~~LOC~~ SOLN
80.0000 mg | Freq: Two times a day (BID) | SUBCUTANEOUS | Status: DC
  Administered 2013-01-10 – 2013-01-12 (×4): 80 mg via SUBCUTANEOUS
  Filled 2013-01-10 (×4): qty 0.8

## 2013-01-10 MED ORDER — ALBUTEROL SULFATE (5 MG/ML) 0.5% IN NEBU
INHALATION_SOLUTION | RESPIRATORY_TRACT | Status: AC
  Filled 2013-01-10: qty 0.5

## 2013-01-10 MED ORDER — SIMVASTATIN 20 MG PO TABS
20.0000 mg | ORAL_TABLET | Freq: Every evening | ORAL | Status: DC
  Administered 2013-01-10 – 2013-01-14 (×5): 20 mg via ORAL
  Filled 2013-01-10 (×5): qty 1

## 2013-01-10 MED ORDER — WARFARIN - PHARMACIST DOSING INPATIENT: Freq: Every day | Status: DC

## 2013-01-10 MED ORDER — AMLODIPINE BESYLATE 5 MG PO TABS
10.0000 mg | ORAL_TABLET | Freq: Every day | ORAL | Status: DC
  Administered 2013-01-11 – 2013-01-15 (×5): 10 mg via ORAL
  Filled 2013-01-10 (×5): qty 2

## 2013-01-10 MED ORDER — ALBUTEROL SULFATE (5 MG/ML) 0.5% IN NEBU
2.5000 mg | INHALATION_SOLUTION | RESPIRATORY_TRACT | Status: DC | PRN
  Administered 2013-01-10 – 2013-01-13 (×9): 2.5 mg via RESPIRATORY_TRACT
  Filled 2013-01-10 (×8): qty 0.5

## 2013-01-10 MED ORDER — ENOXAPARIN SODIUM 80 MG/0.8ML ~~LOC~~ SOLN
80.0000 mg | SUBCUTANEOUS | Status: DC
Start: 2013-01-10 — End: 2013-01-10

## 2013-01-10 MED ORDER — LORAZEPAM 2 MG/ML IJ SOLN
1.0000 mg | INTRAMUSCULAR | Status: DC | PRN
  Administered 2013-01-10: 1 mg via INTRAVENOUS
  Filled 2013-01-10: qty 1

## 2013-01-10 NOTE — ED Provider Notes (Signed)
History  This chart was scribed for Bethany Quarry, MD by [name], ED Scribe. This patient was seen in room [facility]06/[facility]06 and the patient's care was started at 10:54 AM.  CSN: [id number]  Arrival date & time 01/10/13  1038   First MD Initiated Contact with Patient 01/10/13 1054      Chief Complaint  Patient presents with  . Dizziness    The history is provided by the patient and a relative (daughters). No language interpreter was used.   [name] is a 79 y.o. female who presents to the Emergency Department for sudden onset, now resolving, constant left-sided facial droop with associated bilateral hand numbness and decreased left field of vision that started while at her Cardiologist's office PTA. Pt had a recent 4 day admission for HTN with a discharge date 3 days ago after having one episode of lightheadedness in her PCP's office. Pt was following up with her Cardiologist today when her daughters noticed facial droop and were told to come to the ED for evaluation by [name]. Pt states that she feels lightheaded currently and family reports that the facial droop has resolved. She denies denies weakness in her extremities CP, SOB and fever as associated symptoms. She has a h/o COPD and is a current everyday smoker but denies being on oxygen at home. She denies alcohol use.  Pt lives alone.  PCP is [name]  Past Medical History  Diagnosis Date  . Hypercholesteremia   . Coronary artery disease   . PVD (peripheral vascular disease)   . Hypertension   . DVT (deep venous thrombosis) 05/2008  . Bilateral iliac artery occlusion   . Aortic valve sclerosis   . Atrial fibrillation     Past Surgical History  Procedure Laterality Date  . Iliac artery stent Left 05/2008    [name]  . Iliac artery stent Right 2010    [name]  . Brain tumor excision  1999    for hemangioma [name])    No family history on file.  History  Substance Use Topics  . Smoking  status: Current Every Day Smoker    Types: Cigarettes  . Smokeless tobacco: Not on file  . Alcohol Use: No    No OB history provided.  Review of Systems  Constitutional: Negative for fever.  Eyes: Positive for visual disturbance.  Respiratory: Negative for cough and shortness of breath.   Cardiovascular: Negative for chest pain.  Gastrointestinal: Negative for nausea, vomiting, abdominal pain and diarrhea.  Neurological: Positive for light-headedness and numbness. Negative for weakness.  All other systems reviewed and are negative.    Allergies  Review of patient's allergies indicates no known allergies.  Home Medications   Current Outpatient Rx  Name  Route  Sig  Dispense  Refill  . amLODipine (NORVASC) 10 MG tablet   Oral   Take 10 mg by mouth daily.         Marland Kitchen aspirin EC 81 MG tablet   Oral   Take 81 mg by mouth daily.         . clopidogrel (PLAVIX) 75 MG tablet   Oral   Take 75 mg by mouth daily.         Marland Kitchen ibuprofen (ADVIL,MOTRIN) 600 MG tablet   Oral   Take 600 mg by mouth every 6 (six) hours as needed for pain.         Marland Kitchen labetalol (NORMODYNE) 300 MG tablet   Oral  Take 300 mg by mouth 2 (two) times daily.         . quinapril-hydrochlorothiazide (ACCURETIC) 20-25 MG per tablet   Oral   Take 1 tablet by mouth daily.         . rivaroxaban (XARELTO) 20 MG TABS   Oral   Take 1 tablet (20 mg total) by mouth daily with supper.   30 tablet   3   . simvastatin (ZOCOR) 20 MG tablet   Oral   Take 20 mg by mouth every evening.           Vitals Signs:  BP 150/85  Pulse 72  Temp(Src) 98.3 F (36.8 C) (Oral)  Resp 18  Ht 5\' 7"  (1.702 m)  Wt 185 lb (83.915 kg)  BMI 28.97 kg/m2  SpO2 96%  Physical Exam  Nursing note and vitals reviewed. Constitutional: She is oriented to person, place, and time. She appears well-developed and well-nourished. No distress.  HENT:  Head: Normocephalic and atraumatic.  Mouth/Throat: Oropharynx is clear and  moist.  Eyes: Conjunctivae and EOM are normal. Pupils are equal, round, and reactive to light.  Left superior visual field deficit   Neck: Normal range of motion. Neck supple. No tracheal deviation present.  Cardiovascular: Normal rate.  An irregularly irregular rhythm present. Exam reveals no gallop and no friction rub.   No murmur heard. Pulmonary/Chest: Effort normal. No respiratory distress.  Diffuse rhonchi with expiratory wheezes  Abdominal: Soft. There is no tenderness.  Musculoskeletal: Normal range of motion.  Neurological: She is alert and oriented to person, place, and time.  5/5 strength throughout, normal heel to shin, normal finger to nose, no pronator's drift, decreased sharp sensation throughout the left side  Skin: Skin is warm and dry.  Psychiatric: She has a normal mood and affect. Her behavior is normal.    ED Course  Procedures (including critical care time)  DIAGNOSTIC STUDIES: Oxygen Saturation is 96% on room air, normal by my interpretation.    COORDINATION OF CARE: 11:05 AM-Discussed treatment plan which includes review of prior records (CXR, CBC panel, UA) with pt at bedside and pt agreed to plan.   11:56 AM-Consult complete with [name], neurology. Patient case explained and discussed. Agrees to tele psych pt. Call ended at 11:58 AM.  12:30 PM-Consult complete with [name], neurology. Patient case explained and discussed. On his exam, the visual field cut is gone. Pt states that symptoms have resolved. Advises to admit for a TIA. Call ended at 12:32 PM.  12:54 PM-Consult complete with [name], pt's PCP. Patient case explained and discussed agrees to admit patient for further evaluation and treatment. Call ended at 12:56 PM.  Labs Reviewed  COMPREHENSIVE METABOLIC PANEL - Abnormal; Notable for the following:    Glucose, Bld 132 (*)    BUN 30 (*)    Creatinine, Ser 1.24 (*)    GFR calc non Af Amer 40 (*)    GFR calc Af Amer 47 (*)     All other components within normal limits  URINALYSIS, ROUTINE W REFLEX MICROSCOPIC - Abnormal; Notable for the following:    Specific Gravity, Urine >1.030 (*)    Protein, ur 100 (*)    All other components within normal limits  GLUCOSE, CAPILLARY - Abnormal; Notable for the following:    Glucose-Capillary 129 (*)    All other components within normal limits  URINE MICROSCOPIC-ADD ON - Abnormal; Notable for the following:    Squamous Epithelial / LPF FEW (*)  Bacteria, UA FEW (*)    All other components within normal limits  ETHANOL  PROTIME-INR  APTT  CBC  DIFFERENTIAL  TROPONIN I  URINE RAPID DRUG SCREEN (HOSP PERFORMED)  POCT I-STAT TROPONIN I   Ct Head Wo Contrast  01/10/2013  *RADIOLOGY REPORT*  Clinical Data: Left facial droop, bilateral hand numbness, decreased left vision field of view, dizziness, history of prior hemangioma resection, coronary artery disease, hypertension  CT HEAD WITHOUT CONTRAST  Technique:  Contiguous axial images were obtained from the base of the skull through the vertex without contrast.  Comparison: 01/04/2013  Findings: Streak artifacts despite repeating images. Prior frontal craniotomy with encephalomalacia at the left frontal lobe post resection. Ex vacuo dilatation of the frontal horns of the lateral ventricles. No hydrocephalus, midline shift or mass effect. Old bilateral lacunar infarcts of the basal ganglia. Stable white matter low attenuation within the right frontal lobe as well.  No intracranial hemorrhage, mass lesion, or evidence of acute infarction. No extra-axial fluid collections. Atherosclerotic calcification of internal carotid vertebral arteries bilaterally at skull base. No acute osseous findings.  IMPRESSION: Post frontal craniotomy with again identified encephalomalacia of bilateral frontal lobes greater on the left. Old bilateral basal ganglia lacunar infarcts. No acute intracranial abnormalities.   Original Report Authenticated By: Ulyses Southward, M.D.      No diagnosis found.   Date: 01/10/2013  Rate: 73  Rhythm: atrial fibrillation  QRS Axis: normal  Intervals: a fib  ST/T Wave abnormalities: t wave inversion v6  Conduction Disutrbances:none  Narrative Interpretation:   Old EKG Reviewed: unchanged    MDM  Neurology consult obtained. I spoke directly with the neurologist. He had the history of my exam and what has, occurred today. When he examined the patient via telephone neurology he feels that the visual field cut has resolved. He cannot test for sensation. This would give her maximum score of 1/10 and the onset of the symptoms are unclear. The facial droop but did occur at the clear time has resolved. The patient is not a TPA candidate. I have discussed the patient's care with [name]. He will admit the patient for stroke assessment. The patient does have atrial fibrillation and is anticoagulated but is subtherapeutic on her INR at 1.09. The patient will receive lovenox , have a pharmacy consult for Coumadin management, and the placed in a telemetry bed. The patient has remained hemodynamically stable here.   CRITICAL CARE Performed by: [name]   Total critical care time: 60  Critical care time was exclusive of separately billable procedures and treating other patients.  Critical care was necessary to treat or prevent imminent or life-threatening deterioration.  Critical care was time spent personally by me on the following activities: development of treatment plan with patient and/or surrogate as well as nursing, discussions with consultants, evaluation of patient's response to treatment, examination of patient, obtaining history from patient or surrogate, ordering and performing treatments and interventions, ordering and review of laboratory studies, ordering and review of radiographic studies, pulse oximetry and re-evaluation of patient's condition.    I personally performed the services described in  this documentation, which was scribed in my presence. The recorded information has been reviewed and considered.   Bethany Quarry, MD 01/10/13 [phone]

## 2013-01-10 NOTE — Progress Notes (Signed)
Pts BP remained elevated at 1hr check after giving prn clonidine, ativan given per order, nursing will continue to monitor. Notified RT of pts increased respiratory effort, they have assessed and treated pt. Pt states breathing feels much better. Sheryn Bison

## 2013-01-10 NOTE — ED Notes (Signed)
Per neuro telepsych Consult, pt's loss of vision in left superior field per prior EDP assessment has resolved at this time according to neuro consult assessment. RN and family at bedside during assessment. No other changes noted in pt status. nad noted.

## 2013-01-10 NOTE — ED Notes (Signed)
Pt made aware that a urine sample is needed. Pt unable to give sample at this time. CBG was obtained and glucometer read 129. Family at bedside. NAD noted at this time.

## 2013-01-10 NOTE — Progress Notes (Signed)
ANTICOAGULATION CONSULT NOTE - Initial Consult  Pharmacy Consult for Lovenox and Coumadin Indication: atrial fibrillation  No Known Allergies  Patient Measurements: Height: 5\' 7"  (170.2 cm) Weight: 185 lb (83.915 kg) IBW/kg (Calculated) : 61.6  Vital Signs: Temp: 97.7 F (36.5 C) (03/05 1451) Temp src: Oral (03/05 1048) BP: 177/85 mmHg (03/05 1451) Pulse Rate: 83 (03/05 1451)  Labs:  Recent Labs  01/10/13 1131  HGB 13.2  HCT 41.0  PLT 188  APTT 32  LABPROT 14.0  INR 1.09  CREATININE 1.24*  TROPONINI <0.30   Estimated Creatinine Clearance: 40.9 ml/min (by C-G formula based on Cr of 1.24).  Medical History: Past Medical History  Diagnosis Date  . Hypercholesteremia   . Coronary artery disease   . PVD (peripheral vascular disease)   . Hypertension   . DVT (deep venous thrombosis) 05/2008  . Bilateral iliac artery occlusion   . Aortic valve sclerosis   . Atrial fibrillation    Medications:  Scheduled:  . amLODipine  10 mg Oral Daily  . antiseptic oral rinse  15 mL Mouth Rinse BID  . enoxaparin (LOVENOX) injection  80 mg Subcutaneous NOW  . enoxaparin (LOVENOX) injection  80 mg Subcutaneous Q12H  . lisinopril  20 mg Oral Daily   And  . hydrochlorothiazide  25 mg Oral Daily  . labetalol  300 mg Oral BID  . simvastatin  20 mg Oral QPM  . warfarin  7.5 mg Oral ONCE-1800  . Warfarin - Pharmacist Dosing Inpatient   Does not apply q1800  . [DISCONTINUED] quinapril-hydrochlorothiazide  1 tablet Oral Daily   Assessment: 79yo female with h/o afib.  Was recently discharged on Xarelto for afib.  Now pt readmitted and MD ordered Lovenox and Coumadin.  Pt has good renal fxn for age.  Estimated Creatinine Clearance: 40.9 ml/min (by C-G formula based on Cr of 1.24).  Goal of Therapy:  INR 2-3 Heparin level 0.3-0.7 units/ml Anti-Xa level 0.6-1.2 units/ml 4hrs after LMWH dose given Monitor platelets by anticoagulation protocol: Yes   Plan:  Lovenox 1mg /Kg SQ q12hrs  until INR therapeutic Coumadin 7.5mg  today x 1 INR daily CBC per protocol  Valrie Hart A 01/10/2013,4:47 PM

## 2013-01-10 NOTE — ED Notes (Signed)
Pt states that she is hungry, and has not eaten all day. Checked with RN. MD has pt on a clear liquid diet. Pt was advised that she would be going upstairs soon. Pt was given a Sprite.

## 2013-01-10 NOTE — Progress Notes (Signed)
When pt went down for MRI, she became anxious and SOB, radiology tech notified me. Pt was placed on 2.5 L of O2 and the SOB was relieved. When pt returned to her room, she was labored and still c/o SOB, O2 increased to 4L and pts O2 sat was 100%. BP elevated, Dr. Sudie Bailey made aware, order received to monitor and for prn clonidine and lorazepam. [name]

## 2013-01-10 NOTE — H&P (Signed)
[name] MRN: [id number] DOB/AGE: 1931/07/31 79 y.o. Primary Care Physician:FANTA,[name], MD Admit date: 01/10/2013 Chief Complaint:  Lt side facial deviation, blurred vision and lt side hand. HPI:  This is a 79 years old female patient admitted through ER due to the above complaints. She developed the symptoms in  Her cardiology clinic. Her symptoms resolved when she reached ER. She had also sudden weakness presyncopal symptoms in my office about a week ago.She was admitted and was found to have atrial fibrillation. Patient was admitted and was found to have atrial fibrillation. She was started on anticoagulation according cardiology recommendation. No headache,fever, chills, cough, nausea vomiting , abdominal pain, nausea or vomiting.  Past Medical History  Diagnosis Date  . Hypercholesteremia   . Coronary artery disease   . PVD (peripheral vascular disease)   . Hypertension   . DVT (deep venous thrombosis) 05/2008  . Bilateral iliac artery occlusion   . Aortic valve sclerosis   . Atrial fibrillation    Past Surgical History  Procedure Laterality Date  . Iliac artery stent Left 05/2008    Dr. Allyson Sabal  . Iliac artery stent Right 2010    Dr. Allyson Sabal  . Brain tumor excision  1999    for hemangioma [facility])  . Cataract extraction Bilateral   . Lens replacement Left         Family History  Problem Relation Age of Onset  . Hypertension Mother   . Heart attack Father     Social History:  reports that she has been smoking Cigarettes.  She has a 17.5 pack-year smoking history. She does not have any smokeless tobacco history on file. She reports that she does not drink alcohol or use illicit drugs.  Allergies: No Known Allergies  Medications Prior to Admission  Medication Sig Dispense Refill  . amLODipine (NORVASC) 10 MG tablet Take 10 mg by mouth daily.      Marland Kitchen aspirin EC 81 MG tablet Take 81 mg by mouth daily.      . clopidogrel (PLAVIX) 75 MG tablet Take 75 mg by mouth  daily.      Marland Kitchen ibuprofen (ADVIL,MOTRIN) 600 MG tablet Take 600 mg by mouth every 6 (six) hours as needed for pain.      Marland Kitchen labetalol (NORMODYNE) 300 MG tablet Take 300 mg by mouth 2 (two) times daily.      . quinapril-hydrochlorothiazide (ACCURETIC) 20-25 MG per tablet Take 1 tablet by mouth daily.      . rivaroxaban (XARELTO) 20 MG TABS Take 1 tablet (20 mg total) by mouth daily with supper.  30 tablet  3  . simvastatin (ZOCOR) 20 MG tablet Take 20 mg by mouth every evening.      . warfarin (COUMADIN) 5 MG tablet Take 5 mg by mouth daily.           WUJ:WJXBJ from the symptoms mentioned above,there are no other symptoms referable to all systems reviewed.  Physical Exam: Blood pressure 170/82, pulse 74, temperature 98 F (36.7 C), temperature source Oral, resp. rate 20, height 5\' 7"  (1.702 m), weight 83.915 kg (185 lb), SpO2 100.00%.  General Condition- alert, awake and comfortable HE ENT- pupils equal and reactive, neck supple Respiratory- decreased air entry, bilateral rhonchi CVS- S1 and S2 heard, no murmur or gallop ABD- soft and lax, bowel sound + EXT- no leg edema Neuro- alert, awake and orient, nogross deficit    Recent Labs  01/10/13 1131  WBC 6.4  NEUTROABS 4.7  HGB 13.2  HCT 41.0  MCV 85.8  PLT 188    Recent Labs  01/10/13 1131  NA 142  K 3.7  CL 104  CO2 28  GLUCOSE 132*  BUN 30*  CREATININE 1.24*  CALCIUM 9.7  lablast2(ast:2,ALT:2,alkphos:2,bilitot:2,prot:2,albumin:2)@    Recent Results (from the past 240 hour(s))  URINE CULTURE     Status: None   Collection Time    01/04/13  2:44 PM      Result Value Range Status   Specimen Description URINE, CLEAN CATCH   Final   Special Requests NONE   Final   Culture  Setup Time 01/05/2013 01:43   Final   Colony Count NO GROWTH   Final   Culture NO GROWTH   Final   Report Status 01/06/2013 FINAL   Final     Ct Head Wo Contrast  01/10/2013  *RADIOLOGY REPORT*  Clinical Data: Left facial droop, bilateral  hand numbness, decreased left vision field of view, dizziness, history of prior hemangioma resection, coronary artery disease, hypertension  CT HEAD WITHOUT CONTRAST  Technique:  Contiguous axial images were obtained from the base of the skull through the vertex without contrast.  Comparison: 01/04/2013  Findings: Streak artifacts despite repeating images. Prior frontal craniotomy with encephalomalacia at the left frontal lobe post resection. Ex vacuo dilatation of the frontal horns of the lateral ventricles. No hydrocephalus, midline shift or mass effect. Old bilateral lacunar infarcts of the basal ganglia. Stable white matter low attenuation within the right frontal lobe as well.  No intracranial hemorrhage, mass lesion, or evidence of acute infarction. No extra-axial fluid collections. Atherosclerotic calcification of internal carotid vertebral arteries bilaterally at skull base. No acute osseous findings.  IMPRESSION: Post frontal craniotomy with again identified encephalomalacia of bilateral frontal lobes greater on the left. Old bilateral basal ganglia lacunar infarcts. No acute intracranial abnormalities.   Original Report Authenticated By: Ulyses Southward, M.D.    Ct Head Wo Contrast  01/04/2013  *RADIOLOGY REPORT*  Clinical Data: History of non malignant brain tumor removal.  Near syncopal episode.  CT HEAD WITHOUT CONTRAST  Technique:  Contiguous axial images were obtained from the base of the skull through the vertex without contrast.  Comparison: None.  Findings: The bifrontal encephalomalacia related to a remote craniotomy, likely for meningioma removal.  No acute stroke, hemorrhage, mass lesion, hydrocephalus, or extra-axial fluid. Remote right basal ganglia infarct.  Moderate vascular calcification.  Hyperostosis.  Unremarkable craniotomy defect. Clear sinuses and mastoids.  IMPRESSION: Postsurgical and post ischemic changes as described.  No acute intracranial findings.   Original Report Authenticated  By: Davonna Belling, M.D.    Mr Laqueta Jean Wo Contrast  01/10/2013  *RADIOLOGY REPORT*  Clinical Data: Episode of severe dizziness.  Weakness.  History of meningioma resection 1999.  MRI HEAD WITHOUT AND WITH CONTRAST  Technique:  Multiplanar, multiecho pulse sequences of the brain and surrounding structures were obtained according to standard protocol without and with intravenous contrast  Contrast: 8mL MULTIHANCE GADOBENATE DIMEGLUMINE 529 MG/ML IV SOLN  Comparison: 01/10/2013 head CT.  No comparison brain MR.  Findings: Motion degraded exam.  Fast technique imaging had to be utilized.  The patient was not able to complete T2-weighted axial imaging.  Artifact caused by a metal utilized for frontal craniotomy.  Taking this limitation into account, no acute infarct is noted.  Remote basal ganglia infarcts largest involving the right lenticular nucleus with hemorrhagic breakdown products at this level.  Prior right frontal craniotomy for meningioma resection. Significant encephalomalacia frontal lobes  which is partially hemorrhagic.  On this motion degraded examination, no findings to suggest recurrent meningioma.  Overall, no intracranial mass or abnormal enhancement is noted.  Small vessel disease type changes.  Exophthalmos.  Transverse ligament hypertrophy.  Atrophy without hydrocephalus.  Limited evaluation of intracranial vasculature.  IMPRESSION: Exam limited by motion and metallic artifact without findings of an acute infarct.  Postsurgical changes frontal lobes with encephalomalacia without obvious recurrent tumor.  Please see above discussion   Original Report Authenticated By: Lacy Duverney, M.D.    US Carotid Duplex Bilateral  01/10/2013  *RADIOLOGY REPORT*  Clinical Data: Left facial droop, bilateral hand numbness, dizzy. Coronary artery disease.  Bilateral iliac artery disease. Hypertension, tobacco use.  BILATERAL CAROTID DUPLEX ULTRASOUND  Technique: Wallace Cullens scale imaging, color Doppler and duplex ultrasound  was performed of bilateral carotid and vertebral arteries in the neck.  Comparison: 01/05/2013  Criteria:  Quantification of carotid stenosis is based on velocity parameters that correlate the residual internal carotid diameter with NASCET-based stenosis levels, using the diameter of the distal internal carotid lumen as the denominator for stenosis measurement.  The following velocity measurements were obtained:                   PEAK SYSTOLIC/END DIASTOLIC RIGHT ICA:                        60/20cm/sec CCA:                        73/8cm/sec SYSTOLIC ICA/CCA RATIO:     0.83 DIASTOLIC ICA/CCA RATIO:    2.66 ECA:                        18cm/sec  LEFT ICA:                        66/15cm/sec CCA:                        65/7cm/sec SYSTOLIC ICA/CCA RATIO:     1.01 DIASTOLIC ICA/CCA RATIO:    2.28 ECA:                        70cm/sec  Findings:  RIGHT CAROTID ARTERY: There is mild intimal thickening in the common carotid artery with some eccentric calcified nonocclusive plaque in the distal segment.  There is eccentric irregular partially calcified plaque in the carotid bulb without high-grade stenosis.  Normal wave forms and color Doppler signal.  The distal ICA is mildly tortuous.  RIGHT VERTEBRAL ARTERY:  Normal flow direction and waveform.  LEFT CAROTID ARTERY: Eccentric nonocclusive plaque in the mid and distal common carotid artery.  Eccentric partially calcified plaque in the carotid bulb and proximal ICA without high-grade stenosis. Normal wave forms and color Doppler signal.  LEFT VERTEBRAL ARTERY:  Normal flow direction and waveform.  IMPRESSION:  1.  Little change from study 1 week ago.  Bilateral eccentric carotid bifurcation plaque resulting in less than 50% diameter stenosis. The exam does not exclude plaque ulceration or embolization.  Continued surveillance recommended.   Original Report Authenticated By: D. Andria Rhein, MD    US Carotid Duplex Bilateral  01/05/2013  *RADIOLOGY REPORT*  Clinical Data:  Syncope, hypertension, coronary artery disease, tobacco use.  BILATERAL CAROTID DUPLEX ULTRASOUND  Technique: Wallace Cullens scale imaging, color Doppler and duplex ultrasound was  performed of bilateral carotid and vertebral arteries in the neck.  Comparison: None available  Criteria:  Quantification of carotid stenosis is based on velocity parameters that correlate the residual internal carotid diameter with NASCET-based stenosis levels, using the diameter of the distal internal carotid lumen as the denominator for stenosis measurement.  The following velocity measurements were obtained:                   PEAK SYSTOLIC/END DIASTOLIC RIGHT ICA:                        58/16cm/sec CCA:                        57/12cm/sec SYSTOLIC ICA/CCA RATIO:     1.02 DIASTOLIC ICA/CCA RATIO:    1.4 ECA:                        76cm/sec  LEFT ICA:                        59/17cm/sec CCA:                        54/9cm/sec SYSTOLIC ICA/CCA RATIO:     1.09 DIASTOLIC ICA/CCA RATIO:    1.81 ECA:                        53cm/sec  Findings:  RIGHT CAROTID ARTERY: Eccentric calcified plaque in the distal common carotid artery and bulb without high-grade stenosis.  There is eccentric plaque extending into the lumen of the carotid bulb and proximal ECA.  Normal wave forms and color Doppler signal.  RIGHT VERTEBRAL ARTERY:  Normal flow direction and waveform.  LEFT CAROTID ARTERY: Eccentric calcified nonocclusive plaque in the mid common carotid artery.  There is partially calcified plaque in the carotid bifurcation extending to the proximal ICA without high- grade stenosis.  Normal wave forms and color Doppler signal.  LEFT VERTEBRAL ARTERY:  Normal flow direction and waveform.  IMPRESSION:  1.  Bilateral eccentric calcified carotid bifurcation plaque, resulting in less than 50% diameter stenosis. The exam does not exclude plaque ulceration or embolization.  Continued surveillance recommended.   Original Report Authenticated By: D. Andria Rhein, MD    Dg  Chest Portable 1 View  01/04/2013  *RADIOLOGY REPORT*  Clinical Data: Near syncope.  PORTABLE CHEST - 1 VIEW  Comparison: 05/21/2008  Findings: Numerous leads and wires project over the chest.  Midline trachea.  Moderate cardiomegaly.  Tortuous descending thoracic aorta. No pleural effusion or pneumothorax.  No congestive failure.  Clear lungs.  IMPRESSION: Cardiomegaly without congestive failure.   Original Report Authenticated By: Jeronimo Greaves, M.D.    Impression: 1. Recurrent facial drooping and lt sided numbness and weakness probably seconaday to TIA 2. Atrial fibrillation, rate controlled 3. Hypertension-not well controlled 4.Hyperlipdemia 5. PVD 6. Tobacco smoking  Active Problems:   * No active hospital problems. *     Plan: Neurology consult MRI of the brain Continue anticoagulating We will adjust her antihypertensives.      FANTA,[name]  01/10/2013, 9:26 PM

## 2013-01-10 NOTE — ED Notes (Signed)
PT reports was leaving [place] [place] cardiology office and felt dizzy and daughter noticed left sided facial droop.  Daughters took pt back into the doctor's office and Dr. Royann Shivers saw pt and sent pt here for evaluation.  Pt reports was admitted last Thursday for syncopal episode and discharged on SUnday.  Daughter thinks pt did not receive coumadin while she was in the hospital.  PT denies any pain.  C/O feeling dizzy.  Dr. Royann Shivers told Charge RN the facial droop was subsiding in the office.  PT denies any pain, weakness or numbness in extremities.

## 2013-01-11 ENCOUNTER — Observation Stay (HOSPITAL_COMMUNITY): Payer: Medicare Other

## 2013-01-11 DIAGNOSIS — I4821 Permanent atrial fibrillation: Secondary | ICD-10-CM

## 2013-01-11 DIAGNOSIS — I350 Nonrheumatic aortic (valve) stenosis: Secondary | ICD-10-CM

## 2013-01-11 DIAGNOSIS — E785 Hyperlipidemia, unspecified: Secondary | ICD-10-CM

## 2013-01-11 DIAGNOSIS — IMO0001 Reserved for inherently not codable concepts without codable children: Secondary | ICD-10-CM

## 2013-01-11 DIAGNOSIS — I63032 Cerebral infarction due to thrombosis of left carotid artery: Secondary | ICD-10-CM

## 2013-01-11 LAB — BLOOD GAS, ARTERIAL
O2 Saturation: 98.2 %
Patient temperature: 37
TCO2: 25 mmol/L (ref 0–100)
pH, Arterial: 7.37 (ref 7.350–7.450)

## 2013-01-11 LAB — CBC
MCH: 28.1 pg (ref 26.0–34.0)
MCHC: 32.2 g/dL (ref 30.0–36.0)
MCV: 87.3 fL (ref 78.0–100.0)
Platelets: 181 10*3/uL (ref 150–400)

## 2013-01-11 LAB — PROTIME-INR: Prothrombin Time: 15.9 seconds — ABNORMAL HIGH (ref 11.6–15.2)

## 2013-01-11 MED ORDER — METHYLPREDNISOLONE SODIUM SUCC 125 MG IJ SOLR
125.0000 mg | Freq: Four times a day (QID) | INTRAMUSCULAR | Status: DC
  Administered 2013-01-11 – 2013-01-12 (×5): 125 mg via INTRAVENOUS
  Filled 2013-01-11 (×5): qty 2

## 2013-01-11 MED ORDER — CLONIDINE HCL 0.2 MG PO TABS: 0.3000 mg | ORAL_TABLET | ORAL | Status: DC | PRN

## 2013-01-11 MED ORDER — HYDRALAZINE HCL 25 MG PO TABS
25.0000 mg | ORAL_TABLET | Freq: Three times a day (TID) | ORAL | Status: DC
  Administered 2013-01-11 – 2013-01-15 (×13): 25 mg via ORAL
  Filled 2013-01-11 (×13): qty 1

## 2013-01-11 MED ORDER — WARFARIN SODIUM 7.5 MG PO TABS
7.5000 mg | ORAL_TABLET | Freq: Once | ORAL | Status: AC
  Administered 2013-01-11: 7.5 mg via ORAL
  Filled 2013-01-11: qty 1

## 2013-01-11 MED ORDER — BOOST / RESOURCE BREEZE PO LIQD
1.0000 | Freq: Three times a day (TID) | ORAL | Status: DC
  Administered 2013-01-11 – 2013-01-15 (×12): 1 via ORAL

## 2013-01-11 MED ORDER — CEFTRIAXONE SODIUM 1 G IJ SOLR
1.0000 g | INTRAMUSCULAR | Status: DC
  Administered 2013-01-11 – 2013-01-14 (×4): 1 g via INTRAVENOUS
  Filled 2013-01-11 (×4): qty 10

## 2013-01-11 MED ORDER — NICOTINE 21 MG/24HR TD PT24
21.0000 mg | MEDICATED_PATCH | Freq: Every day | TRANSDERMAL | Status: DC
  Administered 2013-01-11 – 2013-01-15 (×5): 21 mg via TRANSDERMAL
  Filled 2013-01-11 (×5): qty 1

## 2013-01-11 NOTE — Progress Notes (Signed)
Pt continues to have labored breathing that is somewhat worse than earlier. Dr. Felecia Shelling made aware, order for CXR, cardiology consult,  and pt started on solumedrol and IV ABX. RT is aware and is following pt. Sheryn Bison, RN

## 2013-01-11 NOTE — Care Management Note (Signed)
    Page 1 of 2   01/15/2013     10:10:04 AM   CARE MANAGEMENT NOTE 01/15/2013  Patient:  [name], [name]   Account Number:  [id number]  Date Initiated:  01/11/2013  Documentation initiated by:  Sharrie Rothman  Subjective/Objective Assessment:   Pt admitted from home with possible TIA. Pt lives alone and has 2 daughters that live in [place] who call very often to check on pt. Pt also has siblings that are close by to check on pt. Pt has currently been indpendent prior to hospitali.     Action/Plan:   Pt and family very interested in [facility] at discharge.. CM gave private agency list to pts daughters and explained to them that that help would be self pay. Will continue to follow.   Anticipated DC Date:  01/12/2013   Anticipated DC Plan:  HOME W HOME HEALTH SERVICES      DC Planning Services  CM consult      [facility] Choice  HOME HEALTH   Choice offered to / List presented to:  C-1 Patient        HH arranged  HH-1 RN  HH-4 NURSE'S AIDE      HH agency  [facility]   Status of service:  Completed, signed off Medicare Important Message given?  YES (If response is "NO", the following Medicare IM given date fields will be blank) Date Medicare IM given:  01/15/2013 Date Additional Medicare IM given:    Discharge Disposition:  HOME W HOME HEALTH SERVICES  Per UR Regulation:    If discussed at Long Length of Stay Meetings, dates discussed:    Comments:  01/15/13 0955 Arlyss Queen, RN BSN CM Pt discharged home today with [facility]. Alroy Bailiff of [facility] is aware and will collect the pts information from the chart. Pt has no DME needs noted. Pt does not qualify for home O2. HH services to start within 48 hours. Pt and pts nurse aware of discharge arrangements.  01/11/13 1450 Arlyss Queen, RN BSN CM

## 2013-01-11 NOTE — Progress Notes (Signed)
Subjective: Patient feels today. No numbness or weakness. She has no headache or chest pain.No new complaint.  Objective: Vital signs in last 24 hours: Temp:  [97.7 F (36.5 C)-98.4 F (36.9 C)] 98.2 F (36.8 C) (03/06 0626) Pulse Rate:  [72-83] 74 (03/06 0626) Resp:  [18-22] 20 (03/06 0626) BP: (150-223)/(68-107) 185/90 mmHg (03/06 0626) SpO2:  [95 %-100 %] 99 % (03/06 0626) Weight:  [83.915 kg (185 lb)] 83.915 kg (185 lb) (03/05 1046) Weight change:     Intake/Output from previous day:    PHYSICAL EXAM General appearance: alert and no distress Resp: diminished breath sounds bilaterally and rhonchi bilaterally Cardio: irregularly irregular rhythm GI: soft, non-tender; bowel sounds normal; no masses,  no organomegaly Extremities: extremities normal, atraumatic, no cyanosis or edema  Lab Results:    @labtest @ [facility] No results found for this basename: PHART, PCO2, PO2ART, TCO2, HCO3,  in the last 72 hours CULTURES Recent Results (from the past 240 hour(s))  URINE CULTURE     Status: None   Collection Time    01/04/13  2:44 PM      Result Value Range Status   Specimen Description URINE, CLEAN CATCH   Final   Special Requests NONE   Final   Culture  Setup Time 01/05/2013 01:43   Final   Colony Count NO GROWTH   Final   Culture NO GROWTH   Final   Report Status 01/06/2013 FINAL   Final   Studies/Results: Ct Head Wo Contrast  01/10/2013  *RADIOLOGY REPORT*  Clinical Data: Left facial droop, bilateral hand numbness, decreased left vision field of view, dizziness, history of prior hemangioma resection, coronary artery disease, hypertension  CT HEAD WITHOUT CONTRAST  Technique:  Contiguous axial images were obtained from the base of the skull through the vertex without contrast.  Comparison: 01/04/2013  Findings: Streak artifacts despite repeating images. Prior frontal craniotomy with encephalomalacia at the left frontal lobe post resection. Ex vacuo dilatation of the frontal  horns of the lateral ventricles. No hydrocephalus, midline shift or mass effect. Old bilateral lacunar infarcts of the basal ganglia. Stable white matter low attenuation within the right frontal lobe as well.  No intracranial hemorrhage, mass lesion, or evidence of acute infarction. No extra-axial fluid collections. Atherosclerotic calcification of internal carotid vertebral arteries bilaterally at skull base. No acute osseous findings.  IMPRESSION: Post frontal craniotomy with again identified encephalomalacia of bilateral frontal lobes greater on the left. Old bilateral basal ganglia lacunar infarcts. No acute intracranial abnormalities.   Original Report Authenticated By: Ulyses Southward, M.D.    Mr Laqueta Jean Wo Contrast  01/10/2013  *RADIOLOGY REPORT*  Clinical Data: Episode of severe dizziness.  Weakness.  History of meningioma resection 1999.  MRI HEAD WITHOUT AND WITH CONTRAST  Technique:  Multiplanar, multiecho pulse sequences of the brain and surrounding structures were obtained according to standard protocol without and with intravenous contrast  Contrast: 8mL MULTIHANCE GADOBENATE DIMEGLUMINE 529 MG/ML IV SOLN  Comparison: 01/10/2013 head CT.  No comparison brain MR.  Findings: Motion degraded exam.  Fast technique imaging had to be utilized.  The patient was not able to complete T2-weighted axial imaging.  Artifact caused by a metal utilized for frontal craniotomy.  Taking this limitation into account, no acute infarct is noted.  Remote basal ganglia infarcts largest involving the right lenticular nucleus with hemorrhagic breakdown products at this level.  Prior right frontal craniotomy for meningioma resection. Significant encephalomalacia frontal lobes which is partially hemorrhagic.  On this motion degraded  examination, no findings to suggest recurrent meningioma.  Overall, no intracranial mass or abnormal enhancement is noted.  Small vessel disease type changes.  Exophthalmos.  Transverse ligament  hypertrophy.  Atrophy without hydrocephalus.  Limited evaluation of intracranial vasculature.  IMPRESSION: Exam limited by motion and metallic artifact without findings of an acute infarct.  Postsurgical changes frontal lobes with encephalomalacia without obvious recurrent tumor.  Please see above discussion   Original Report Authenticated By: Lacy Duverney, M.D.    US Carotid Duplex Bilateral  01/10/2013  *RADIOLOGY REPORT*  Clinical Data: Left facial droop, bilateral hand numbness, dizzy. Coronary artery disease.  Bilateral iliac artery disease. Hypertension, tobacco use.  BILATERAL CAROTID DUPLEX ULTRASOUND  Technique: Wallace Cullens scale imaging, color Doppler and duplex ultrasound was performed of bilateral carotid and vertebral arteries in the neck.  Comparison: 01/05/2013  Criteria:  Quantification of carotid stenosis is based on velocity parameters that correlate the residual internal carotid diameter with NASCET-based stenosis levels, using the diameter of the distal internal carotid lumen as the denominator for stenosis measurement.  The following velocity measurements were obtained:                   PEAK SYSTOLIC/END DIASTOLIC RIGHT ICA:                        60/20cm/sec CCA:                        73/8cm/sec SYSTOLIC ICA/CCA RATIO:     0.83 DIASTOLIC ICA/CCA RATIO:    2.66 ECA:                        18cm/sec  LEFT ICA:                        66/15cm/sec CCA:                        65/7cm/sec SYSTOLIC ICA/CCA RATIO:     1.01 DIASTOLIC ICA/CCA RATIO:    2.28 ECA:                        70cm/sec  Findings:  RIGHT CAROTID ARTERY: There is mild intimal thickening in the common carotid artery with some eccentric calcified nonocclusive plaque in the distal segment.  There is eccentric irregular partially calcified plaque in the carotid bulb without high-grade stenosis.  Normal wave forms and color Doppler signal.  The distal ICA is mildly tortuous.  RIGHT VERTEBRAL ARTERY:  Normal flow direction and waveform.  LEFT  CAROTID ARTERY: Eccentric nonocclusive plaque in the mid and distal common carotid artery.  Eccentric partially calcified plaque in the carotid bulb and proximal ICA without high-grade stenosis. Normal wave forms and color Doppler signal.  LEFT VERTEBRAL ARTERY:  Normal flow direction and waveform.  IMPRESSION:  1.  Little change from study 1 week ago.  Bilateral eccentric carotid bifurcation plaque resulting in less than 50% diameter stenosis. The exam does not exclude plaque ulceration or embolization.  Continued surveillance recommended.   Original Report Authenticated By: D. Andria Rhein, MD     Medications: I have reviewed the patient's current medications.  Assesment: 1. Probably TIA 2. Atrial fibrillation, rate controlled  3. Hypertension-not well controlled  4.Hyperlipdemia  5. PVD  6. Tobacco smoking     Active Problems:   * No active  hospital problems. *    Plan: We will start hydralazine 25 mg po BID for her B/P Neurology consult pending Continue telemetry Regular treatment    LOS: 1 day   Tanieka Pownall 01/11/2013, 7:49 AM

## 2013-01-11 NOTE — Progress Notes (Signed)
INITIAL NUTRITION ASSESSMENT  DOCUMENTATION CODES Per approved criteria  -Not Applicable   INTERVENTION: Resource Breeze po TID, each supplement provides 250 kcal and 9 grams of protein.  NUTRITION DIAGNOSIS: inadequate oral intake related inconsistent meal intake AEB pt hx   Goal: Pt to meet >/= 90% of their estimated nutrition needs  Monitor:  Diet advancement and percentage oral intake, labs and wt changes  Reason for Assessment: Malnutrition Screen  79 y.o. female  Admitting Dx: Probable TIA, HTN, HLD  ASSESSMENT: Pt denies wt loss and says that her appetite is good. However her daughter reports that often she eats only one meal per day. We discussed the importance of adequate nutrient intake to support/help improve her health status. She is agreeable to receive oral supplements while hospitalized. Will add appropriate clear liquid supplement for now and reassess as needed when diet is advanced. Pt does not meet criteria for malnutrition at this time but is at risk nutritionally related to her multiple chronic dz and inconsistent nutrient intake.  Height: Ht Readings from Last 1 Encounters:  01/10/13 5\' 7"  (1.702 m)    Weight: Wt Readings from Last 1 Encounters:  01/10/13 185 lb (83.915 kg)    Ideal Body Weight: 135# (61.3 kg)  % Ideal Body Weight: 137%  Wt Readings from Last 10 Encounters:  01/10/13 185 lb (83.915 kg)  01/06/13 185 lb 13.6 oz (84.3 kg)    Usual Body Weight: 185# (83.9 kg)  % Usual Body Weight: 100%  BMI:  Body mass index is 28.97 kg/(m^2). Overweight  Estimated Nutritional Needs: Kcal: 1525-1775 Protein: 65-75 gr Fluid:> 1800 ml/day  Skin: No issues noted  Diet Order: Clear Liquid  EDUCATION NEEDS: -Education needs addressed   Intake/Output Summary (Last 24 hours) at 01/11/13 1015 Last data filed at 01/11/13 0900  Gross per 24 hour  Intake    360 ml  Output      0 ml  Net    360 ml    Last BM: PTA  Labs:   Recent  Labs Lab 01/05/13 0201 01/06/13 0522 01/10/13 1131  NA 142 140 142  K 3.1* 3.7 3.7  CL 105 105 104  CO2 27 23 28   BUN 29* 17 30*  CREATININE 1.36* 1.16* 1.24*  CALCIUM 9.0 8.8 9.7  GLUCOSE 84 101* 132*    CBG (last 3)   Recent Labs  01/10/13 1132  GLUCAP 129*    Scheduled Meds: . amLODipine  10 mg Oral Daily  . antiseptic oral rinse  15 mL Mouth Rinse BID  . enoxaparin (LOVENOX) injection  80 mg Subcutaneous Q12H  . hydrALAZINE  25 mg Oral Q8H  . lisinopril  20 mg Oral Daily   And  . hydrochlorothiazide  25 mg Oral Daily  . labetalol  300 mg Oral BID  . simvastatin  20 mg Oral QPM  . warfarin  7.5 mg Oral ONCE-1800  . Warfarin - Pharmacist Dosing Inpatient   Does not apply q1800    Continuous Infusions:   Past Medical History  Diagnosis Date  . Hypercholesteremia   . Coronary artery disease   . PVD (peripheral vascular disease)   . Hypertension   . DVT (deep venous thrombosis) 05/2008  . Bilateral iliac artery occlusion   . Aortic valve sclerosis   . Atrial fibrillation     Past Surgical History  Procedure Laterality Date  . Iliac artery stent Left 05/2008    Dr. Allyson Sabal  . Iliac artery stent Right 2010  Dr. Allyson Sabal  . Brain tumor excision  1999    for hemangioma [facility])  . Cataract extraction Bilateral   . Lens replacement Left    Royann Shivers MS,RD,LDN,CSG Office: #[phone] Pager: [phone]

## 2013-01-11 NOTE — Progress Notes (Signed)
Pt and family c/o difficulty speaking/ slurred speech. Dr. Felecia Shelling notified, VS are WNL at this time. Face is slightly asymmetrical, otherwise no neurological changes. Order for neurology to see patient confirmed.

## 2013-01-11 NOTE — Progress Notes (Signed)
UR Chart Review Completed  

## 2013-01-11 NOTE — Progress Notes (Signed)
ANTICOAGULATION CONSULT NOTE   Pharmacy Consult for Lovenox and Coumadin Indication: atrial fibrillation  No Known Allergies  Patient Measurements: Height: 5\' 7"  (170.2 cm) Weight: 185 lb (83.915 kg) IBW/kg (Calculated) : 61.6  Vital Signs: Temp: 98.2 F (36.8 C) (03/06 0626) BP: 169/84 mmHg (03/06 0859) Pulse Rate: 74 (03/06 0754)  Labs:  Recent Labs  01/10/13 1131 01/11/13 0509  HGB 13.2 11.7*  HCT 41.0 36.3  PLT 188 181  APTT 32  --   LABPROT 14.0 15.9*  INR 1.09 1.30  CREATININE 1.24*  --   TROPONINI <0.30  --    Estimated Creatinine Clearance: 40.9 ml/min (by C-G formula based on Cr of 1.24).  Medical History: Past Medical History  Diagnosis Date  . Hypercholesteremia   . Coronary artery disease   . PVD (peripheral vascular disease)   . Hypertension   . DVT (deep venous thrombosis) 05/2008  . Bilateral iliac artery occlusion   . Aortic valve sclerosis   . Atrial fibrillation    Medications:  Scheduled:  . [EXPIRED] albuterol      . amLODipine  10 mg Oral Daily  . antiseptic oral rinse  15 mL Mouth Rinse BID  . enoxaparin (LOVENOX) injection  80 mg Subcutaneous Q12H  . hydrALAZINE  25 mg Oral Q8H  . lisinopril  20 mg Oral Daily   And  . hydrochlorothiazide  25 mg Oral Daily  . labetalol  300 mg Oral BID  . simvastatin  20 mg Oral QPM  . [COMPLETED] warfarin  7.5 mg Oral ONCE-1800  . warfarin  7.5 mg Oral ONCE-1800  . Warfarin - Pharmacist Dosing Inpatient   Does not apply q1800  . [DISCONTINUED] enoxaparin (LOVENOX) injection  80 mg Subcutaneous NOW  . [DISCONTINUED] quinapril-hydrochlorothiazide  1 tablet Oral Daily   Assessment: 79yo female with h/o afib.  Was recently discharged on Xarelto for afib.  Now pt readmitted and MD ordered Lovenox and Coumadin.  It was reported to me that the patient never took the Xarelto and resumed Coumadin after discharge.  Pt has good renal fxn for age.  Estimated Creatinine Clearance: 40.9 ml/min (by C-G formula  based on Cr of 1.24).  Goal of Therapy:  INR 2-3 Heparin level 0.3-0.7 units/ml Anti-Xa level 0.6-1.2 units/ml 4hrs after LMWH dose given Monitor platelets by anticoagulation protocol: Yes   Plan:  Lovenox 1mg /Kg SQ q12hrs until INR therapeutic Repeat Coumadin 7.5mg  today x 1 INR daily CBC per protocol  Margo Aye, Scott A 01/11/2013,9:49 AM

## 2013-01-11 NOTE — Consult Note (Deleted)
CARDIOLOGY CONSULT NOTE  New note by Dr.Rothbart

## 2013-01-11 NOTE — Consult Note (Signed)
[facility] [name], MD     [email or web address]          [name] is an 79 y.o. female.   ASSESSMENT/PLAN: The patient most likely had a transient ischemic attack. Risk factors include age, atrial fibrillation, nicotine use and coronary disease. The patient should be placed on long-term anticoagulation. The admission medication list is somewhat confusing as it appears she was on 2 anticoagulation. The family confirms however that she was only supposed to be on the warfarin. It appears that she is also on 2 antiplatelet agents aspirin and Plavix. I think this increases the risk dramatically of bleeding without adding benefit. I suggest that at least one of these be discontinued. The patient also has a history of frontal encephalomalacia status post meningioma resection. Given this history, she is at risk of having seizures and therefore we should do an EEG to make sure that her spell was not an epileptic event. This can be done as an outpatient if need be.  The patient is a 79 year old black female who was recently hospitalized here about a week ago for the acute onset of presyncope associated with dizziness. It appears at that time she was diagnosed with new onset atrial fibrillations. The patient was started on Xarelto at that time although isn't clear she hasn't to this medication. The family tells me that she had been on warfarin however since December and by cardiologist because of concerns of her stents and coronary disease. The patient had another event a couple days ago when she was in her cardiologist office. She developed left facial droop and the weakness in the left hand. She also appeared to be confused. The event lasted for only 2-65minutes. The event that brought her to the hospital yesterday was witnessed by her daughters. Her daughters are present during the evaluation today. They report that the patient was confused and didn't have dysarthria due to 2  minutes of the event. Again, they report that the patient got better relatively quickly. No unusual chest pain or shortness of breath is reported with the event. No loss of consciousness, headaches, GU or GI symptoms. The patient reports that he has had some dyspnea on exertion since being hospitalized.  GENERAL: This very pleasant female in no acute distress.  HEENT: Supple. Atraumatic normocephalic.   ABDOMEN: soft  EXTREMITIES: No edema   BACK: Normal.  SKIN: Normal by inspection.    MENTAL STATUS: Alert and oriented. Speech, language and cognition are generally intact. Judgment and insight normal.   CRANIAL NERVES: Pupils are equal, round and reactive to light and accomodation; extra ocular movements are full, there is no significant nystagmus; visual fields are full; upper and lower facial muscles are normal in strength and symmetric, there is no flattening of the nasolabial folds; tongue is midline; uvula is midline; shoulder elevation is normal.  MOTOR: Normal tone, bulk and strength; no pronator drift.  COORDINATION: Left finger to nose is normal, right finger to nose is normal, No rest tremor; no intention tremor; no postural tremor; no bradykinesia.  REFLEXES: Deep tendon reflexes are symmetrical and normal. Babinski reflexes are flexor bilaterally.   SENSATION: Normal to light touch.   The patient is brain MRI is reviewed in person. There is a marked large left frontal encephalomalacia. There is also a a much smaller right frontal encephalomalacia. Both of these areas are surrounded with marked confluent frontal leukoencephalopathy. There is also confluent leukoencephalopathy involving the paraventricular area of the posterior horn lateral  ventricle. Nothing acute is seen on diffusion imaging. The patient has lucency involving the basal ganglia bilaterally. The largest seems to involve almost a full lentiform nucleus on the right side.   Past Medical History  Diagnosis Date    . Hypercholesteremia   . Coronary artery disease   . PVD (peripheral vascular disease)   . Hypertension   . DVT (deep venous thrombosis) 05/2008  . Bilateral iliac artery occlusion   . Aortic valve sclerosis   . Atrial fibrillation     Past Surgical History  Procedure Laterality Date  . Iliac artery stent Left 05/2008    Dr. Allyson Sabal  . Iliac artery stent Right 2010    Dr. Allyson Sabal  . Brain tumor excision  1999    for hemangioma [facility])  . Cataract extraction Bilateral   . Lens replacement Left     Family History  Problem Relation Age of Onset  . Hypertension Mother   . Heart attack Father     Social History:  reports that she has been smoking Cigarettes.  She has a 17.5 pack-year smoking history. She does not have any smokeless tobacco history on file. She reports that she does not drink alcohol or use illicit drugs.  Allergies: No Known Allergies  Medications: Prior to Admission medications   Medication Sig Start Date End Date Taking? Authorizing Provider  amLODipine (NORVASC) 10 MG tablet Take 10 mg by mouth daily.   Yes Historical Provider, MD  aspirin EC 81 MG tablet Take 81 mg by mouth daily.   Yes Historical Provider, MD  clopidogrel (PLAVIX) 75 MG tablet Take 75 mg by mouth daily.   Yes Historical Provider, MD  ibuprofen (ADVIL,MOTRIN) 600 MG tablet Take 600 mg by mouth every 6 (six) hours as needed for pain.   Yes Historical Provider, MD  labetalol (NORMODYNE) 300 MG tablet Take 300 mg by mouth 2 (two) times daily.   Yes Historical Provider, MD  quinapril-hydrochlorothiazide (ACCURETIC) 20-25 MG per tablet Take 1 tablet by mouth daily.   Yes Historical Provider, MD  rivaroxaban (XARELTO) 20 MG TABS Take 1 tablet (20 mg total) by mouth daily with supper. 01/07/13  Yes Avon Gully, MD  simvastatin (ZOCOR) 20 MG tablet Take 20 mg by mouth every evening.   Yes Historical Provider, MD  warfarin (COUMADIN) 5 MG tablet Take 5 mg by mouth daily.   Yes Historical Provider, MD     Scheduled Meds: . amLODipine  10 mg Oral Daily  . antiseptic oral rinse  15 mL Mouth Rinse BID  . cefTRIAXone (ROCEPHIN)  IV  1 g Intravenous Q24H  . enoxaparin (LOVENOX) injection  80 mg Subcutaneous Q12H  . feeding supplement  1 Container Oral TID BM  . hydrALAZINE  25 mg Oral Q8H  . lisinopril  20 mg Oral Daily   And  . hydrochlorothiazide  25 mg Oral Daily  . labetalol  300 mg Oral BID  . methylPREDNISolone (SOLU-MEDROL) injection  125 mg Intravenous Q6H  . nicotine  21 mg Transdermal Daily  . simvastatin  20 mg Oral QPM  . Warfarin - Pharmacist Dosing Inpatient   Does not apply q1800   Continuous Infusions:  PRN Meds:.albuterol, cloNIDine, LORazepam  Blood pressure 141/76, pulse 76, temperature 98.2 F (36.8 C), temperature source Oral, resp. rate 20, height 5\' 7"  (1.702 m), weight 83.915 kg (185 lb), SpO2 98.00%.   Results for orders placed during the hospital encounter of 01/10/13 (from the past 48 hour(s))  ETHANOL  Status: None   Collection Time    01/10/13 11:31 AM      Result Value Range   Alcohol, Ethyl (B) <11  0 - 11 mg/dL   Comment:            LOWEST DETECTABLE LIMIT FOR     SERUM ALCOHOL IS 11 mg/dL     FOR MEDICAL PURPOSES ONLY  PROTIME-INR     Status: None   Collection Time    01/10/13 11:31 AM      Result Value Range   Prothrombin Time 14.0  11.6 - 15.2 seconds   INR 1.09  0.00 - 1.49  APTT     Status: None   Collection Time    01/10/13 11:31 AM      Result Value Range   aPTT 32  24 - 37 seconds  CBC     Status: None   Collection Time    01/10/13 11:31 AM      Result Value Range   WBC 6.4  4.0 - 10.5 K/uL   RBC 4.78  3.87 - 5.11 MIL/uL   Hemoglobin 13.2  12.0 - 15.0 g/dL   HCT 16.1  09.6 - 04.5 %   MCV 85.8  78.0 - 100.0 fL   MCH 27.6  26.0 - 34.0 pg   MCHC 32.2  30.0 - 36.0 g/dL   RDW 40.9  81.1 - 91.4 %   Platelets 188  150 - 400 K/uL  DIFFERENTIAL     Status: None   Collection Time    01/10/13 11:31 AM      Result Value Range    Neutrophils Relative 74  43 - 77 %   Neutro Abs 4.7  1.7 - 7.7 K/uL   Lymphocytes Relative 15  12 - 46 %   Lymphs Abs 0.9  0.7 - 4.0 K/uL   Monocytes Relative 10  3 - 12 %   Monocytes Absolute 0.6  0.1 - 1.0 K/uL   Eosinophils Relative 1  0 - 5 %   Eosinophils Absolute 0.1  0.0 - 0.7 K/uL   Basophils Relative 1  0 - 1 %   Basophils Absolute 0.0  0.0 - 0.1 K/uL  COMPREHENSIVE METABOLIC PANEL     Status: Abnormal   Collection Time    01/10/13 11:31 AM      Result Value Range   Sodium 142  135 - 145 mEq/L   Potassium 3.7  3.5 - 5.1 mEq/L   Chloride 104  96 - 112 mEq/L   CO2 28  19 - 32 mEq/L   Glucose, Bld 132 (*) 70 - 99 mg/dL   BUN 30 (*) 6 - 23 mg/dL   Creatinine, Ser 7.82 (*) 0.50 - 1.10 mg/dL   Calcium 9.7  8.4 - 95.6 mg/dL   Total Protein 7.2  6.0 - 8.3 g/dL   Albumin 3.5  3.5 - 5.2 g/dL   AST 17  0 - 37 U/L   ALT 23  0 - 35 U/L   Alkaline Phosphatase 76  39 - 117 U/L   Total Bilirubin 0.7  0.3 - 1.2 mg/dL   GFR calc non Af Amer 40 (*) >90 mL/min   GFR calc Af Amer 47 (*) >90 mL/min   Comment:            The eGFR has been calculated     using the CKD EPI equation.     This calculation has not been  validated in all clinical     situations.     eGFR's persistently     <90 mL/min signify     possible Chronic Kidney Disease.  TROPONIN I     Status: None   Collection Time    01/10/13 11:31 AM      Result Value Range   Troponin I <0.30  <0.30 ng/mL   Comment:            Due to the release kinetics of cTnI,     a negative result within the first hours     of the onset of symptoms does not rule out     myocardial infarction with certainty.     If myocardial infarction is still suspected,     repeat the test at appropriate intervals.  GLUCOSE, CAPILLARY     Status: Abnormal   Collection Time    01/10/13 11:32 AM      Result Value Range   Glucose-Capillary 129 (*) 70 - 99 mg/dL   Comment 1 Documented in Chart     Comment 2 Notify RN    POCT I-STAT TROPONIN I      Status: None   Collection Time    01/10/13 11:47 AM      Result Value Range   Troponin i, poc 0.00  0.00 - 0.08 ng/mL   Comment 3            Comment: Due to the release kinetics of cTnI,     a negative result within the first hours     of the onset of symptoms does not rule out     myocardial infarction with certainty.     If myocardial infarction is still suspected,     repeat the test at appropriate intervals.  URINE RAPID DRUG SCREEN (HOSP PERFORMED)     Status: None   Collection Time    01/10/13 11:54 AM      Result Value Range   Opiates NONE DETECTED  NONE DETECTED   Cocaine NONE DETECTED  NONE DETECTED   Benzodiazepines NONE DETECTED  NONE DETECTED   Amphetamines NONE DETECTED  NONE DETECTED   Tetrahydrocannabinol NONE DETECTED  NONE DETECTED   Barbiturates NONE DETECTED  NONE DETECTED   Comment:            DRUG SCREEN FOR MEDICAL PURPOSES     ONLY.  IF CONFIRMATION IS NEEDED     FOR ANY PURPOSE, NOTIFY LAB     WITHIN 5 DAYS.                LOWEST DETECTABLE LIMITS     FOR URINE DRUG SCREEN     Drug Class       Cutoff (ng/mL)     Amphetamine      1000     Barbiturate      200     Benzodiazepine   200     Tricyclics       300     Opiates          300     Cocaine          300     THC              50  URINALYSIS, ROUTINE W REFLEX MICROSCOPIC     Status: Abnormal   Collection Time    01/10/13 11:54 AM      Result Value Range   Color, Urine YELLOW  YELLOW  APPearance CLEAR  CLEAR   Specific Gravity, Urine >1.030 (*) 1.005 - 1.030   pH 5.5  5.0 - 8.0   Glucose, UA NEGATIVE  NEGATIVE mg/dL   Hgb urine dipstick NEGATIVE  NEGATIVE   Bilirubin Urine NEGATIVE  NEGATIVE   Ketones, ur NEGATIVE  NEGATIVE mg/dL   Protein, ur 518 (*) NEGATIVE mg/dL   Urobilinogen, UA 0.2  0.0 - 1.0 mg/dL   Nitrite NEGATIVE  NEGATIVE   Leukocytes, UA NEGATIVE  NEGATIVE  URINE MICROSCOPIC-ADD ON     Status: Abnormal   Collection Time    01/10/13 11:54 AM      Result Value Range    Squamous Epithelial / LPF FEW (*) RARE   RBC / HPF 0-2  <3 RBC/hpf   Bacteria, UA FEW (*) RARE  PROTIME-INR     Status: Abnormal   Collection Time    01/11/13  5:09 AM      Result Value Range   Prothrombin Time 15.9 (*) 11.6 - 15.2 seconds   INR 1.30  0.00 - 1.49  CBC     Status: Abnormal   Collection Time    01/11/13  5:09 AM      Result Value Range   WBC 5.6  4.0 - 10.5 K/uL   RBC 4.16  3.87 - 5.11 MIL/uL   Hemoglobin 11.7 (*) 12.0 - 15.0 g/dL   HCT 84.1  66.0 - 63.0 %   MCV 87.3  78.0 - 100.0 fL   MCH 28.1  26.0 - 34.0 pg   MCHC 32.2  30.0 - 36.0 g/dL   RDW 16.0  10.9 - 32.3 %   Platelets 181  150 - 400 K/uL  BLOOD GAS, ARTERIAL     Status: Abnormal   Collection Time    01/11/13  2:10 PM      Result Value Range   O2 Content 2.5     pH, Arterial 7.370  7.350 - 7.450   pCO2 arterial 48.3 (*) 35.0 - 45.0 mmHg   pO2, Arterial 95.7  80.0 - 100.0 mmHg   Bicarbonate 27.2 (*) 20.0 - 24.0 mEq/L   TCO2 25.0  0 - 100 mmol/L   Acid-Base Excess 2.4 (*) 0.0 - 2.0 mmol/L   O2 Saturation 98.2     Patient temperature 37.0     Collection site RIGHT BRACHIAL     Drawn by COLLECTED BY RT     Sample type ARTERIAL     Allens test (pass/fail) PASS  PASS    Dg Chest 2 View  01/11/2013  *RADIOLOGY REPORT*  Clinical Data: Shortness of breath.  Wheezing.  CHEST - 2 VIEW  Comparison: 05/21/2008.  01/04/2013.  Findings: There is stable moderate cardiac silhouette enlargement. Ectasia, tortuosity, and   nonaneurysmal calcification of the thoracic aorta are seen. There is upper lobe vascular prominence without evidence of alveolar edema, consolidation, or pleural effusion formation.  There is slight elevation of the right hemidiaphragm with minimal atelectasis and hazy infiltrate in the right base.  There is minimal degenerative spondylosis.  There is slight flattening of the diaphragm on lateral image which may reflect minimal hyperinflation.  History given of wheezing.  IMPRESSION: Stable moderate  cardiac silhouette enlargement.  Upper lobe vascular prominence without evidence of alveolar edema, consolidation, or pleural effusion formation.  Slight elevation right hemidiaphragm with minimal atelectasis and hazy infiltrate in right base.  History given of wheezing.  Slight flattening of diaphragm on lateral image may reflect minimal hyperinflation.  Original Report Authenticated By: Onalee Hua Call    Ct Head Wo Contrast  01/10/2013  *RADIOLOGY REPORT*  Clinical Data: Left facial droop, bilateral hand numbness, decreased left vision field of view, dizziness, history of prior hemangioma resection, coronary artery disease, hypertension  CT HEAD WITHOUT CONTRAST  Technique:  Contiguous axial images were obtained from the base of the skull through the vertex without contrast.  Comparison: 01/04/2013  Findings: Streak artifacts despite repeating images. Prior frontal craniotomy with encephalomalacia at the left frontal lobe post resection. Ex vacuo dilatation of the frontal horns of the lateral ventricles. No hydrocephalus, midline shift or mass effect. Old bilateral lacunar infarcts of the basal ganglia. Stable white matter low attenuation within the right frontal lobe as well.  No intracranial hemorrhage, mass lesion, or evidence of acute infarction. No extra-axial fluid collections. Atherosclerotic calcification of internal carotid vertebral arteries bilaterally at skull base. No acute osseous findings.  IMPRESSION: Post frontal craniotomy with again identified encephalomalacia of bilateral frontal lobes greater on the left. Old bilateral basal ganglia lacunar infarcts. No acute intracranial abnormalities.   Original Report Authenticated By: Ulyses Southward, M.D.    Mr Laqueta Jean Wo Contrast  01/10/2013  *RADIOLOGY REPORT*  Clinical Data: Episode of severe dizziness.  Weakness.  History of meningioma resection 1999.  MRI HEAD WITHOUT AND WITH CONTRAST  Technique:  Multiplanar, multiecho pulse sequences of the brain and  surrounding structures were obtained according to standard protocol without and with intravenous contrast  Contrast: 8mL MULTIHANCE GADOBENATE DIMEGLUMINE 529 MG/ML IV SOLN  Comparison: 01/10/2013 head CT.  No comparison brain MR.  Findings: Motion degraded exam.  Fast technique imaging had to be utilized.  The patient was not able to complete T2-weighted axial imaging.  Artifact caused by a metal utilized for frontal craniotomy.  Taking this limitation into account, no acute infarct is noted.  Remote basal ganglia infarcts largest involving the right lenticular nucleus with hemorrhagic breakdown products at this level.  Prior right frontal craniotomy for meningioma resection. Significant encephalomalacia frontal lobes which is partially hemorrhagic.  On this motion degraded examination, no findings to suggest recurrent meningioma.  Overall, no intracranial mass or abnormal enhancement is noted.  Small vessel disease type changes.  Exophthalmos.  Transverse ligament hypertrophy.  Atrophy without hydrocephalus.  Limited evaluation of intracranial vasculature.  IMPRESSION: Exam limited by motion and metallic artifact without findings of an acute infarct.  Postsurgical changes frontal lobes with encephalomalacia without obvious recurrent tumor.  Please see above discussion   Original Report Authenticated By: Lacy Duverney, M.D.    US Carotid Duplex Bilateral  01/10/2013  *RADIOLOGY REPORT*  Clinical Data: Left facial droop, bilateral hand numbness, dizzy. Coronary artery disease.  Bilateral iliac artery disease. Hypertension, tobacco use.  BILATERAL CAROTID DUPLEX ULTRASOUND  Technique: Wallace Cullens scale imaging, color Doppler and duplex ultrasound was performed of bilateral carotid and vertebral arteries in the neck.  Comparison: 01/05/2013  Criteria:  Quantification of carotid stenosis is based on velocity parameters that correlate the residual internal carotid diameter with NASCET-based stenosis levels, using the diameter  of the distal internal carotid lumen as the denominator for stenosis measurement.  The following velocity measurements were obtained:                   PEAK SYSTOLIC/END DIASTOLIC RIGHT ICA:                        60/20cm/sec CCA:  73/8cm/sec SYSTOLIC ICA/CCA RATIO:     0.83 DIASTOLIC ICA/CCA RATIO:    2.66 ECA:                        18cm/sec  LEFT ICA:                        66/15cm/sec CCA:                        65/7cm/sec SYSTOLIC ICA/CCA RATIO:     1.01 DIASTOLIC ICA/CCA RATIO:    2.28 ECA:                        70cm/sec  Findings:  RIGHT CAROTID ARTERY: There is mild intimal thickening in the common carotid artery with some eccentric calcified nonocclusive plaque in the distal segment.  There is eccentric irregular partially calcified plaque in the carotid bulb without high-grade stenosis.  Normal wave forms and color Doppler signal.  The distal ICA is mildly tortuous.  RIGHT VERTEBRAL ARTERY:  Normal flow direction and waveform.  LEFT CAROTID ARTERY: Eccentric nonocclusive plaque in the mid and distal common carotid artery.  Eccentric partially calcified plaque in the carotid bulb and proximal ICA without high-grade stenosis. Normal wave forms and color Doppler signal.  LEFT VERTEBRAL ARTERY:  Normal flow direction and waveform.  IMPRESSION:  1.  Little change from study 1 week ago.  Bilateral eccentric carotid bifurcation plaque resulting in less than 50% diameter stenosis. The exam does not exclude plaque ulceration or embolization.  Continued surveillance recommended.   Original Report Authenticated By: D. Andria Rhein, MD         Rylan Bernard A. Gerilyn Pilgrim, M.D.  Diplomate, Biomedical engineer of Psychiatry and Neurology ( Neurology). 01/11/2013, 6:29 PM

## 2013-01-12 ENCOUNTER — Inpatient Hospital Stay (HOSPITAL_COMMUNITY): Payer: Medicare Other

## 2013-01-12 ENCOUNTER — Other Ambulatory Visit (HOSPITAL_COMMUNITY): Payer: Medicare Other

## 2013-01-12 ENCOUNTER — Encounter (HOSPITAL_COMMUNITY): Payer: Self-pay | Admitting: Radiology

## 2013-01-12 DIAGNOSIS — I4891 Unspecified atrial fibrillation: Secondary | ICD-10-CM

## 2013-01-12 DIAGNOSIS — G459 Transient cerebral ischemic attack, unspecified: Principal | ICD-10-CM

## 2013-01-12 LAB — CBC
HCT: 39.8 % (ref 36.0–46.0)
Hemoglobin: 12.8 g/dL (ref 12.0–15.0)
MCH: 27.9 pg (ref 26.0–34.0)
MCV: 86.9 fL (ref 78.0–100.0)
Platelets: 199 10*3/uL (ref 150–400)
RBC: 4.58 MIL/uL (ref 3.87–5.11)
WBC: 3.6 10*3/uL — ABNORMAL LOW (ref 4.0–10.5)

## 2013-01-12 MED ORDER — WARFARIN SODIUM 5 MG PO TABS
5.0000 mg | ORAL_TABLET | Freq: Once | ORAL | Status: AC
  Administered 2013-01-12: 5 mg via ORAL
  Filled 2013-01-12: qty 1

## 2013-01-12 MED ORDER — METHYLPREDNISOLONE SODIUM SUCC 125 MG IJ SOLR
60.0000 mg | Freq: Four times a day (QID) | INTRAMUSCULAR | Status: DC
  Administered 2013-01-12 – 2013-01-14 (×7): 60 mg via INTRAVENOUS
  Filled 2013-01-12 (×7): qty 2

## 2013-01-12 NOTE — Progress Notes (Signed)
ANTICOAGULATION CONSULT NOTE   Pharmacy Consult for Lovenox and Coumadin Indication: atrial fibrillation  No Known Allergies  Patient Measurements: Height: 5\' 7"  (170.2 cm) Weight: 185 lb (83.915 kg) IBW/kg (Calculated) : 61.6  Vital Signs: Temp: 97.6 F (36.4 C) (03/07 0529) Temp src: Oral (03/07 0529) BP: 164/65 mmHg (03/07 0645) Pulse Rate: 83 (03/07 0645)  Labs:  Recent Labs  01/10/13 1131 01/11/13 0509 01/12/13 0513  HGB 13.2 11.7* 12.8  HCT 41.0 36.3 39.8  PLT 188 181 199  APTT 32  --   --   LABPROT 14.0 15.9* 23.8*  INR 1.09 1.30 2.24*  CREATININE 1.24*  --   --   TROPONINI <0.30  --   --    Estimated Creatinine Clearance: 40.9 ml/min (by C-G formula based on Cr of 1.24).  Medical History: Past Medical History  Diagnosis Date  . Hypercholesteremia   . Coronary artery disease   . PVD (peripheral vascular disease)   . Hypertension   . DVT (deep venous thrombosis) 05/2008  . Bilateral iliac artery occlusion   . Aortic valve sclerosis   . Atrial fibrillation    Medications:  Scheduled:  . amLODipine  10 mg Oral Daily  . antiseptic oral rinse  15 mL Mouth Rinse BID  . cefTRIAXone (ROCEPHIN)  IV  1 g Intravenous Q24H  . feeding supplement  1 Container Oral TID BM  . hydrALAZINE  25 mg Oral Q8H  . lisinopril  20 mg Oral Daily   And  . hydrochlorothiazide  25 mg Oral Daily  . labetalol  300 mg Oral BID  . methylPREDNISolone (SOLU-MEDROL) injection  125 mg Intravenous Q6H  . nicotine  21 mg Transdermal Daily  . simvastatin  20 mg Oral QPM  . warfarin  5 mg Oral ONCE-1800  . [COMPLETED] warfarin  7.5 mg Oral ONCE-1800  . Warfarin - Pharmacist Dosing Inpatient   Does not apply q1800  . [DISCONTINUED] enoxaparin (LOVENOX) injection  80 mg Subcutaneous Q12H   Assessment: 79yo female with h/o afib on chronic warfarin 5mg  daily.  She was recently discharged on Xarelto but patient reports she never started the Xarelto and resumed Coumadin after discharge.   Her INR was sub-therapeutic on admission & she is currently on Coumadin & Lovenox bridge.  INR therapeutic today.  No bleeding noted.    Goal of Therapy:  INR 2-3   Plan:  Coumadin 5mg  today x 1 INR daily D/C Lovenox  [place], [facility] 01/12/2013,10:21 AM

## 2013-01-12 NOTE — Progress Notes (Signed)
Subjective: Patient developed cough, wheezing and shortness of breath yesterday afternoon. She was started on iv antibiotics, nebulizer treatment and iv steriod. She feels better today.  Objective: Vital signs in last 24 hours: Temp:  [97.2 F (36.2 C)-97.7 F (36.5 C)] 97.2 F (36.2 C) (03/07 1450) Pulse Rate:  [79-83] 81 (03/07 1450) Resp:  [20] 20 (03/07 1450) BP: (131-187)/(65-92) 131/85 mmHg (03/07 1450) SpO2:  [92 %-100 %] 99 % (03/07 1517) Weight change:     Intake/Output from previous day: 03/06 0701 - 03/07 0700 In: 650 [P.O.:600; IV Piggyback:50] Out: -   PHYSICAL EXAM General appearance: alert and no distress Resp: diminished breath sounds bilaterally and rhonchi bilaterally Cardio: irregularly irregular rhythm GI: soft, non-tender; bowel sounds normal; no masses,  no organomegaly Extremities: extremities normal, atraumatic, no cyanosis or edema  Lab Results:    @labtest @ [facility]  Recent Labs  01/11/13 1410  PHART 7.370  PO2ART 95.7  TCO2 25.0  HCO3 27.2*   CULTURES Recent Results (from the past 240 hour(s))  URINE CULTURE     Status: None   Collection Time    01/04/13  2:44 PM      Result Value Range Status   Specimen Description URINE, CLEAN CATCH   Final   Special Requests NONE   Final   Culture  Setup Time 01/05/2013 01:43   Final   Colony Count NO GROWTH   Final   Culture NO GROWTH   Final   Report Status 01/06/2013 FINAL   Final   Studies/Results: Dg Chest 2 View  01/11/2013  *RADIOLOGY REPORT*  Clinical Data: Shortness of breath.  Wheezing.  CHEST - 2 VIEW  Comparison: 05/21/2008.  01/04/2013.  Findings: There is stable moderate cardiac silhouette enlargement. Ectasia, tortuosity, and   nonaneurysmal calcification of the thoracic aorta are seen. There is upper lobe vascular prominence without evidence of alveolar edema, consolidation, or pleural effusion formation.  There is slight elevation of the right hemidiaphragm with minimal atelectasis  and hazy infiltrate in the right base.  There is minimal degenerative spondylosis.  There is slight flattening of the diaphragm on lateral image which may reflect minimal hyperinflation.  History given of wheezing.  IMPRESSION: Stable moderate cardiac silhouette enlargement.  Upper lobe vascular prominence without evidence of alveolar edema, consolidation, or pleural effusion formation.  Slight elevation right hemidiaphragm with minimal atelectasis and hazy infiltrate in right base.  History given of wheezing.  Slight flattening of diaphragm on lateral image may reflect minimal hyperinflation.   Original Report Authenticated By: Onalee Hua Call    Mr Brain W Wo Contrast  01/10/2013  *RADIOLOGY REPORT*  Clinical Data: Episode of severe dizziness.  Weakness.  History of meningioma resection 1999.  MRI HEAD WITHOUT AND WITH CONTRAST  Technique:  Multiplanar, multiecho pulse sequences of the brain and surrounding structures were obtained according to standard protocol without and with intravenous contrast  Contrast: 8mL MULTIHANCE GADOBENATE DIMEGLUMINE 529 MG/ML IV SOLN  Comparison: 01/10/2013 head CT.  No comparison brain MR.  Findings: Motion degraded exam.  Fast technique imaging had to be utilized.  The patient was not able to complete T2-weighted axial imaging.  Artifact caused by a metal utilized for frontal craniotomy.  Taking this limitation into account, no acute infarct is noted.  Remote basal ganglia infarcts largest involving the right lenticular nucleus with hemorrhagic breakdown products at this level.  Prior right frontal craniotomy for meningioma resection. Significant encephalomalacia frontal lobes which is partially hemorrhagic.  On this motion degraded examination,  no findings to suggest recurrent meningioma.  Overall, no intracranial mass or abnormal enhancement is noted.  Small vessel disease type changes.  Exophthalmos.  Transverse ligament hypertrophy.  Atrophy without hydrocephalus.  Limited  evaluation of intracranial vasculature.  IMPRESSION: Exam limited by motion and metallic artifact without findings of an acute infarct.  Postsurgical changes frontal lobes with encephalomalacia without obvious recurrent tumor.  Please see above discussion   Original Report Authenticated By: Lacy Duverney, M.D.     Medications: I have reviewed the patient's current medications.  Assesment: 1. Probably TIA 2. Atrial fibrillation, rate controlled  3. Hypertension-not well controlled  4.Hyperlipdemia  5. PVD  6. Tobacco smoking  7. COPD    Active Problems:   Atrial fibrillation   Accelerated hypertension   CVA (cerebral infarction)   Pure hypercholesterolemia   Aortic sclerosis    Plan: Continue iv steroid and iv antibiotic We will do chest x-ray Neurology consult appreciated Continue telemetry Regular treatment    LOS: 2 days   [name],[name] 01/12/2013, 3:41 PM

## 2013-01-12 NOTE — Consult Note (Addendum)
Patient Name: [name]  MRN: [id number]  HPI: [name] is an 79 y.o. female referred for consultation by Bethany Colla, MD for atrial fibrillation with transient neurologic symptoms.  This nice woman has atrial fibrillation of recent onset, recently treated with warfarin. She also has peripheral vascular disease and generally receives her cardiovascular care from Dr. Nanetta Holden of [facility].  She was recently discharged from hospital after presenting with near syncope. The day of admission, after assessment in the [facility] office, she had recurrent impaired consciousness, this time associated with left facial weakness and left upper extremity weakness. Symptoms subsided fairly quickly. INR was found to be subtherapeutic, but with one additional dose of warfarin INR now exceeds 2.0.  Ms. [name] is a cigarette smoker.  Physical findings of bronchospasm were present on admission prompting treatment for a COPD exacerbation.   Past Medical History  Diagnosis Date  . Hypercholesteremia   . Coronary artery disease   . PVD (peripheral vascular disease)   . Hypertension   . DVT (deep venous thrombosis) 05/2008  . Bilateral iliac artery occlusion   . Aortic valve sclerosis   . Atrial fibrillation    Past Surgical History  Procedure Laterality Date  . Iliac artery stent Left 05/2008    Dr. Allyson Holden  . Iliac artery stent Right 2010    Dr. Allyson Holden  . Brain tumor excision  1999    for hemangioma [facility])  . Cataract extraction Bilateral   . Lens replacement Left    Family History  Problem Relation Age of Onset  . Hypertension Mother   . Heart attack Father    Social History:  reports that she has been smoking Cigarettes.  She has a 17.5 pack-year smoking history. She does not have any smokeless tobacco history on file. She reports that she does not drink alcohol or use illicit drugs.  Allergies: No Known Allergies  Medications:  I have  reviewed the patient's current medications. Scheduled: . amLODipine  10 mg Oral Daily  . antiseptic oral rinse  15 mL Mouth Rinse BID  . cefTRIAXone (ROCEPHIN)  IV  1 g Intravenous Q24H  . feeding supplement  1 Container Oral TID BM  . hydrALAZINE  25 mg Oral Q8H  . lisinopril  20 mg Oral Daily   And  . hydrochlorothiazide  25 mg Oral Daily  . labetalol  300 mg Oral BID  . methylPREDNISolone (SOLU-MEDROL) injection  60 mg Intravenous Q6H  . nicotine  21 mg Transdermal Daily  . simvastatin  20 mg Oral QPM  . warfarin  5 mg Oral ONCE-1800  . Warfarin - Pharmacist Dosing Inpatient   Does not apply q1800   Results for orders placed during the hospital encounter of 01/10/13 (from the past 48 hour(s))  PROTIME-INR     Status: Abnormal   Collection Time    01/11/13  5:09 AM      Result Value Range   Prothrombin Time 15.9 (*) 11.6 - 15.2 seconds   INR 1.30  0.00 - 1.49  CBC     Status: Abnormal   Collection Time    01/11/13  5:09 AM      Result Value Range   WBC 5.6  4.0 - 10.5 K/uL   RBC 4.16  3.87 - 5.11 MIL/uL   Hemoglobin 11.7 (*) 12.0 - 15.0 g/dL   HCT 41.3  24.4 - 01.0 %   MCV 87.3  78.0 - 100.0 fL   MCH 28.1  26.0 - 34.0 pg   MCHC 32.2  30.0 - 36.0 g/dL   RDW 40.9  81.1 - 91.4 %   Platelets 181  150 - 400 K/uL  BLOOD GAS, ARTERIAL     Status: Abnormal   Collection Time    01/11/13  2:10 PM      Result Value Range   O2 Content 2.5     pH, Arterial 7.370  7.350 - 7.450   pCO2 arterial 48.3 (*) 35.0 - 45.0 mmHg   pO2, Arterial 95.7  80.0 - 100.0 mmHg   Bicarbonate 27.2 (*) 20.0 - 24.0 mEq/L   TCO2 25.0  0 - 100 mmol/L   Acid-Base Excess 2.4 (*) 0.0 - 2.0 mmol/L   O2 Saturation 98.2     Patient temperature 37.0     Collection site RIGHT BRACHIAL     Drawn by COLLECTED BY RT     Sample type ARTERIAL     Allens test (pass/fail) PASS  PASS  PROTIME-INR     Status: Abnormal   Collection Time    01/12/13  5:13 AM      Result Value Range   Prothrombin Time 23.8 (*) 11.6  - 15.2 seconds   INR 2.24 (*) 0.00 - 1.49  CBC     Status: Abnormal   Collection Time    01/12/13  5:13 AM      Result Value Range   WBC 3.6 (*) 4.0 - 10.5 K/uL   RBC 4.58  3.87 - 5.11 MIL/uL   Hemoglobin 12.8  12.0 - 15.0 g/dL   HCT 78.2  95.6 - 21.3 %   MCV 86.9  78.0 - 100.0 fL   MCH 27.9  26.0 - 34.0 pg   MCHC 32.2  30.0 - 36.0 g/dL   RDW 08.6  57.8 - 46.9 %   Platelets 199  150 - 400 K/uL  Dg Chest 2 View  01/11/2013  moderate cardiomegaly; tortuous thoracic aorta with calcification of the wall; vascular redistribution; right basilar atelectasis/infiltrate.  [name] Contrast  01/10/2013 Remote basal ganglia infarcts largest involving the right lenticular nucleus with hemorrhagic breakdown products at this level.  Prior right frontal craniotomy for meningioma resection. Significant encephalomalacia frontal lobes which is partially hemorrhagic. Small vessel disease type changes.  Exophthalmos.  Transverse ligament hypertrophy.  Atrophy without hydrocephalus.   Carotid ultrasound: Moderate ICA plaque bilaterally without significant focal stenosis  Review of Systems: General: no anorexia, weight gain or weight loss Cardiac: no chest pain, dyspnea, orthopnea, PND,  or syncope Respiratory: no cough, sputum production or hemoptysis; no dyspnea with modest activity in hospital GI: no nausea, abdominal pain, emesis, diarrhea or constipation Integument: no significant lesions Neurologic:no headache All other systems reviewed and are negative.  Physical Exam: Blood pressure 131/85, pulse 81, temperature 97.2 F (36.2 C), temperature source Oral, resp. rate 20, height 5\' 7"  (1.702 m), weight 83.915 kg (185 lb), SpO2 99.00%.;  Body mass index is 28.97 kg/(m^2). General-Well-developed; no acute distress; mildly overweight HEENT-[place]/AT; PERRL; EOM intact; conjunctiva and lids nl Neck-No JVD; no carotid bruits Endocrine-No thyromegaly Lungs-mild inspiratory and marked expiratory wheezing;  resonant percussion; prolonged I:E ratio Cardiovascular- normal PMI; normal S1 and S2; irregular Abdomen-BS normal; soft and non-tender without masses or organomegaly Musculoskeletal-No deformities, cyanosis or clubbing Neurologic-Nl cranial nerves; symmetric strength and tone Skin- Warm, no significant lesions Extremities-Nl distal pulses on right; decreased on left; no edema  Telemetry:  Atrial fibrillation with excellent control of heart rate; fairly  frequent PVCs with few PVC pairs.  Assessment/Plan:  Atrial fibrillation: Heart rate is well-controlled; with advanced age and female gender, risk of thromboembolism is substantial. Recent neurologic symptoms may well reflect embolism and cardiac are examined. Permanent anticoagulation is recommended. Warfarin is acceptable for now, but in the long-term, her [facility] physicians can consider a newer agent, which will decrease the risk of intracerebral bleed and render anticoagulation easier and more certain.  Possible pneumonia: Treated with antibiotics  Anticoagulation: Concomitant treatment with steroids and antibiotics likely will check sensitivity to warfarin. Frequent INRs should be obtained following discharge until definitely stable.  We appreciate the request for consultation. [facility] will see patient again next week if she remains in hospital.  [place], MD 01/12/2013, 4:09 PM

## 2013-01-12 NOTE — Progress Notes (Signed)
Patient ID: [name], female   DOB: [birth year], 79 y.o.   MRN: [id number]  [facility] NEUROLOGY Shanterica Biehler A. Gerilyn Pilgrim, MD     [email or web address]          [name] is an 79 y.o. female.   Assessment/Plan: Likely TIA. Risk factors hypertension and atrial fibrillation along with age. The patient needs to be on chronic warfarin therapy/anticoagulation. INR is therapeutic today. EEG has not been done but can be done in outpatient setting if need be. No complaints are reported today. The patient has not had any recurrent spells of facial weakness confusion or dysarthria.  She is awake and alert. She is lucid and coherent. Visual fields are intact. Extraocular movements are full. She has good strength in the upper extremities.    Objective: Vital signs in last 24 hours: Temp:  [97.2 F (36.2 C)-97.7 F (36.5 C)] 97.2 F (36.2 C) (03/07 1450) Pulse Rate:  [79-83] 81 (03/07 1450) Resp:  [20] 20 (03/07 1450) BP: (131-187)/(65-92) 131/85 mmHg (03/07 1450) SpO2:  [92 %-100 %] 99 % (03/07 1517)  Intake/Output from previous day: 03/06 0701 - 03/07 0700 In: 650 [P.O.:600; IV Piggyback:50] Out: -  Intake/Output this shift:   Nutritional status: Sodium Restricted   Lab Results: Results for orders placed during the hospital encounter of 01/10/13 (from the past 48 hour(s))  PROTIME-INR     Status: Abnormal   Collection Time    01/11/13  5:09 AM      Result Value Range   Prothrombin Time 15.9 (*) 11.6 - 15.2 seconds   INR 1.30  0.00 - 1.49  CBC     Status: Abnormal   Collection Time    01/11/13  5:09 AM      Result Value Range   WBC 5.6  4.0 - 10.5 K/uL   RBC 4.16  3.87 - 5.11 MIL/uL   Hemoglobin 11.7 (*) 12.0 - 15.0 g/dL   HCT 95.6  21.3 - 08.6 %   MCV 87.3  78.0 - 100.0 fL   MCH 28.1  26.0 - 34.0 pg   MCHC 32.2  30.0 - 36.0 g/dL   RDW 57.8  46.9 - 62.9 %   Platelets 181  150 - 400 K/uL  BLOOD GAS, ARTERIAL     Status: Abnormal   Collection Time   01/11/13  2:10 PM      Result Value Range   O2 Content 2.5     pH, Arterial 7.370  7.350 - 7.450   pCO2 arterial 48.3 (*) 35.0 - 45.0 mmHg   pO2, Arterial 95.7  80.0 - 100.0 mmHg   Bicarbonate 27.2 (*) 20.0 - 24.0 mEq/L   TCO2 25.0  0 - 100 mmol/L   Acid-Base Excess 2.4 (*) 0.0 - 2.0 mmol/L   O2 Saturation 98.2     Patient temperature 37.0     Collection site RIGHT BRACHIAL     Drawn by COLLECTED BY RT     Sample type ARTERIAL     Allens test (pass/fail) PASS  PASS  PROTIME-INR     Status: Abnormal   Collection Time    01/12/13  5:13 AM      Result Value Range   Prothrombin Time 23.8 (*) 11.6 - 15.2 seconds   INR 2.24 (*) 0.00 - 1.49  CBC     Status: Abnormal   Collection Time    01/12/13  5:13 AM      Result Value Range   WBC 3.6 (*)  4.0 - 10.5 K/uL   RBC 4.58  3.87 - 5.11 MIL/uL   Hemoglobin 12.8  12.0 - 15.0 g/dL   HCT 62.9  52.8 - 41.3 %   MCV 86.9  78.0 - 100.0 fL   MCH 27.9  26.0 - 34.0 pg   MCHC 32.2  30.0 - 36.0 g/dL   RDW 24.4  01.0 - 27.2 %   Platelets 199  150 - 400 K/uL    Lipid Panel No results found for this basename: CHOL, TRIG, HDL, CHOLHDL, VLDL, LDLCALC,  in the last 72 hours  Studies/Results: Dg Chest 2 View  01/11/2013  *RADIOLOGY REPORT*  Clinical Data: Shortness of breath.  Wheezing.  CHEST - 2 VIEW  Comparison: 05/21/2008.  01/04/2013.  Findings: There is stable moderate cardiac silhouette enlargement. Ectasia, tortuosity, and   nonaneurysmal calcification of the thoracic aorta are seen. There is upper lobe vascular prominence without evidence of alveolar edema, consolidation, or pleural effusion formation.  There is slight elevation of the right hemidiaphragm with minimal atelectasis and hazy infiltrate in the right base.  There is minimal degenerative spondylosis.  There is slight flattening of the diaphragm on lateral image which may reflect minimal hyperinflation.  History given of wheezing.  IMPRESSION: Stable moderate cardiac silhouette  enlargement.  Upper lobe vascular prominence without evidence of alveolar edema, consolidation, or pleural effusion formation.  Slight elevation right hemidiaphragm with minimal atelectasis and hazy infiltrate in right base.  History given of wheezing.  Slight flattening of diaphragm on lateral image may reflect minimal hyperinflation.   Original Report Authenticated By: Onalee Hua Call     Medications:  Scheduled Meds: . amLODipine  10 mg Oral Daily  . antiseptic oral rinse  15 mL Mouth Rinse BID  . cefTRIAXone (ROCEPHIN)  IV  1 g Intravenous Q24H  . feeding supplement  1 Container Oral TID BM  . hydrALAZINE  25 mg Oral Q8H  . lisinopril  20 mg Oral Daily   And  . hydrochlorothiazide  25 mg Oral Daily  . labetalol  300 mg Oral BID  . methylPREDNISolone (SOLU-MEDROL) injection  60 mg Intravenous Q6H  . nicotine  21 mg Transdermal Daily  . simvastatin  20 mg Oral QPM  . Warfarin - Pharmacist Dosing Inpatient   Does not apply q1800   Continuous Infusions:  PRN Meds:.albuterol, cloNIDine, LORazepam    LOS: 2 days   [place] Ramone A. Gerilyn Pilgrim, M.D.  Diplomate, Biomedical engineer of Psychiatry and Neurology ( Neurology).

## 2013-01-13 ENCOUNTER — Inpatient Hospital Stay (HOSPITAL_COMMUNITY)
Admit: 2013-01-13 | Discharge: 2013-01-13 | Disposition: A | Discharge status: Home / Self Care | Payer: Medicare Other | Attending: Neurology | Admitting: Neurology

## 2013-01-13 MED ORDER — LEVALBUTEROL HCL 0.63 MG/3ML IN NEBU
0.6300 mg | INHALATION_SOLUTION | RESPIRATORY_TRACT | Status: DC
  Administered 2013-01-13 – 2013-01-15 (×10): 0.63 mg via RESPIRATORY_TRACT
  Filled 2013-01-13 (×10): qty 3

## 2013-01-13 MED ORDER — LEVALBUTEROL HCL 0.63 MG/3ML IN NEBU: 0.6300 mg | INHALATION_SOLUTION | Freq: Four times a day (QID) | RESPIRATORY_TRACT | Status: DC

## 2013-01-13 MED ORDER — IPRATROPIUM BROMIDE 0.02 % IN SOLN
0.5000 mg | RESPIRATORY_TRACT | Status: DC
  Administered 2013-01-13 – 2013-01-15 (×7): 0.5 mg via RESPIRATORY_TRACT
  Filled 2013-01-13 (×6): qty 2.5

## 2013-01-13 MED ORDER — IPRATROPIUM BROMIDE 0.02 % IN SOLN
RESPIRATORY_TRACT | Status: AC
  Filled 2013-01-13: qty 2.5

## 2013-01-13 NOTE — Progress Notes (Signed)
EEG completed.

## 2013-01-13 NOTE — Progress Notes (Signed)
Subjective: Patient is still wheezing. No fever or chills. Her CT SCAN showed emphysema.  Objective: Vital signs in last 24 hours: Temp:  [97.2 F (36.2 C)-97.9 F (36.6 C)] 97.8 F (36.6 C) (03/08 0522) Pulse Rate:  [74-86] 81 (03/08 0525) Resp:  [18-22] 18 (03/08 0522) BP: (131-180)/(71-100) 165/100 mmHg (03/08 0525) SpO2:  [95 %-100 %] 95 % (03/08 0536) Weight change:  Last BM Date: 01/12/13  Intake/Output from previous day: 03/07 0701 - 03/08 0700 In: 50 [IV Piggyback:50] Out: -   PHYSICAL EXAM General appearance: alert and no distress Resp: diminished breath sounds bilaterally and rhonchi bilaterally Cardio: irregularly irregular rhythm GI: soft, non-tender; bowel sounds normal; no masses,  no organomegaly Extremities: extremities normal, atraumatic, no cyanosis or edema  Lab Results:    @labtest @ [facility]  Recent Labs  01/11/13 1410  PHART 7.370  PO2ART 95.7  TCO2 25.0  HCO3 27.2*   CULTURES Recent Results (from the past 240 hour(s))  URINE CULTURE     Status: None   Collection Time    01/04/13  2:44 PM      Result Value Range Status   Specimen Description URINE, CLEAN CATCH   Final   Special Requests NONE   Final   Culture  Setup Time 01/05/2013 01:43   Final   Colony Count NO GROWTH   Final   Culture NO GROWTH   Final   Report Status 01/06/2013 FINAL   Final   Studies/Results: Dg Chest 2 View  01/11/2013  *RADIOLOGY REPORT*  Clinical Data: Shortness of breath.  Wheezing.  CHEST - 2 VIEW  Comparison: 05/21/2008.  01/04/2013.  Findings: There is stable moderate cardiac silhouette enlargement. Ectasia, tortuosity, and   nonaneurysmal calcification of the thoracic aorta are seen. There is upper lobe vascular prominence without evidence of alveolar edema, consolidation, or pleural effusion formation.  There is slight elevation of the right hemidiaphragm with minimal atelectasis and hazy infiltrate in the right base.  There is minimal degenerative spondylosis.   There is slight flattening of the diaphragm on lateral image which may reflect minimal hyperinflation.  History given of wheezing.  IMPRESSION: Stable moderate cardiac silhouette enlargement.  Upper lobe vascular prominence without evidence of alveolar edema, consolidation, or pleural effusion formation.  Slight elevation right hemidiaphragm with minimal atelectasis and hazy infiltrate in right base.  History given of wheezing.  Slight flattening of diaphragm on lateral image may reflect minimal hyperinflation.   Original Report Authenticated By: Onalee Hua Call    Ct Chest Wo Contrast  01/12/2013  *RADIOLOGY REPORT*  Clinical Data: Shortness of breath.  Hypertension.  Coronary artery disease.  Cardiomegaly.  Remote history of benign brain tumor removal.  Current smoker.  CT CHEST WITHOUT CONTRAST  Technique:  Multidetector CT imaging of the chest was performed following the standard protocol without IV contrast.  Comparison: Plain film of 01/11/2013.  No prior CT.  Findings: Lungs/pleura: Mild motion degradation throughout. Moderate centrilobular emphysema. No lobar consolidation.  Trace right-sided pleural fluid.  Heart/Mediastinum: Enlargement of the left lobe of the thyroid, without well-defined dominant mass.  Moderate cardiomegaly, with trace pericardial fluid, likely physiologic. Multivessel coronary artery atherosclerosis.  Mild pulmonary artery enlargement. No mediastinal or definite hilar adenopathy, given limitations of unenhanced CT.  Upper abdomen: Bilateral adrenal thickening, suggesting hyperplasia.  Dense atherosclerosis in the aorta and branch vessels.  Bones/Musculoskeletal:  No acute osseous abnormality.  IMPRESSION:  1.  Moderate centrilobular emphysema and advanced coronary artery disease. 2.  No other explanation for shortness  of breath. 3.  Trace right pleural fluid. 4. Pulmonary artery enlargement suggests pulmonary arterial hypertension. 5.  Motion degradation.   Original Report Authenticated  By: Jeronimo Greaves, M.D.     Medications: I have reviewed the patient's current medications.  Assesment: 1. Probably TIA 2. Atrial fibrillation, rate controlled  3. Hypertension-not well controlled  4.Hyperlipdemia  5. PVD  6. Tobacco smoking  7. COPD    Active Problems:   Atrial fibrillation   Accelerated hypertension   CVA (cerebral infarction)   Pure hypercholesterolemia   Aortic sclerosis    Plan: Continue iv steroid and iv antibiotic We will do chest x-ray Neurology consult appreciated Continue telemetry Regular treatment    LOS: 3 days   FANTA,[name] 01/13/2013, 9:42 AM

## 2013-01-13 NOTE — Plan of Care (Signed)
Problem: Phase II Progression Outcomes Goal: Monitor D/C'd if no arrhythmias x 24 hrs Outcome: Not Applicable Date Met:  01/13/13 telemetry

## 2013-01-13 NOTE — Progress Notes (Signed)
ANTICOAGULATION CONSULT NOTE   Pharmacy Consult for Coumadin Indication: atrial fibrillation  No Known Allergies  Patient Measurements: Height: 5\' 7"  (170.2 cm) Weight: 185 lb (83.915 kg) IBW/kg (Calculated) : 61.6  Vital Signs: Temp: 97.8 F (36.6 C) (03/08 0522) Temp src: Oral (03/08 0522) BP: 165/100 mmHg (03/08 0525) Pulse Rate: 81 (03/08 0525)  Labs:  Recent Labs  01/10/13 1131 01/11/13 0509 01/12/13 0513 01/13/13 0541  HGB 13.2 11.7* 12.8  --   HCT 41.0 36.3 39.8  --   PLT 188 181 199  --   APTT 32  --   --   --   LABPROT 14.0 15.9* 23.8* 34.4*  INR 1.09 1.30 2.24* 3.68*  CREATININE 1.24*  --   --   --   TROPONINI <0.30  --   --   --    Estimated Creatinine Clearance: 40.9 ml/min (by C-G formula based on Cr of 1.24).  Medical History: Past Medical History  Diagnosis Date  . Hypercholesteremia   . Coronary artery disease   . PVD (peripheral vascular disease)   . Hypertension   . DVT (deep venous thrombosis) 05/2008  . Bilateral iliac artery occlusion   . Aortic valve sclerosis   . Atrial fibrillation    Medications:  Scheduled:  . amLODipine  10 mg Oral Daily  . antiseptic oral rinse  15 mL Mouth Rinse BID  . cefTRIAXone (ROCEPHIN)  IV  1 g Intravenous Q24H  . feeding supplement  1 Container Oral TID BM  . hydrALAZINE  25 mg Oral Q8H  . lisinopril  20 mg Oral Daily   And  . hydrochlorothiazide  25 mg Oral Daily  . labetalol  300 mg Oral BID  . levalbuterol  0.63 mg Nebulization Q4H WA  . methylPREDNISolone (SOLU-MEDROL) injection  60 mg Intravenous Q6H  . nicotine  21 mg Transdermal Daily  . simvastatin  20 mg Oral QPM  . [COMPLETED] warfarin  5 mg Oral ONCE-1800  . Warfarin - Pharmacist Dosing Inpatient   Does not apply q1800  . [DISCONTINUED] enoxaparin (LOVENOX) injection  80 mg Subcutaneous Q12H  . [DISCONTINUED] levalbuterol  0.63 mg Nebulization Q6H  . [DISCONTINUED] methylPREDNISolone (SOLU-MEDROL) injection  125 mg Intravenous Q6H    Assessment: 79yo female with h/o afib on chronic warfarin 5mg  daily.  She was recently discharged on Xarelto but patient reports she never started the Xarelto and resumed Coumadin after discharge.  Her INR was sub-therapeutic on admission however has now trended above desired goal range.  No bleeding noted.    Goal of Therapy:  INR 2-3   Plan:  Hold Coumadin today x 1 INR daily  Elson Clan 01/13/2013,9:20 AM

## 2013-01-14 ENCOUNTER — Ambulatory Visit: Payer: Self-pay | Admitting: Cardiovascular Disease

## 2013-01-14 DIAGNOSIS — Z7901 Long term (current) use of anticoagulants: Secondary | ICD-10-CM

## 2013-01-14 DIAGNOSIS — I4891 Unspecified atrial fibrillation: Secondary | ICD-10-CM

## 2013-01-14 LAB — PROTIME-INR
INR: 3.13 — ABNORMAL HIGH (ref 0.00–1.49)
Prothrombin Time: 30.5 seconds — ABNORMAL HIGH (ref 11.6–15.2)

## 2013-01-14 MED ORDER — DEXTROSE 5 % IV SOLN
INTRAVENOUS | Status: AC
  Filled 2013-01-14: qty 10

## 2013-01-14 MED ORDER — WARFARIN SODIUM 1 MG PO TABS
1.0000 mg | ORAL_TABLET | Freq: Once | ORAL | Status: AC
  Administered 2013-01-14: 1 mg via ORAL
  Filled 2013-01-14: qty 1

## 2013-01-14 MED ORDER — METHYLPREDNISOLONE SODIUM SUCC 40 MG IJ SOLR
40.0000 mg | Freq: Two times a day (BID) | INTRAMUSCULAR | Status: DC
  Administered 2013-01-14: 40 mg via INTRAVENOUS
  Filled 2013-01-14: qty 1

## 2013-01-14 NOTE — Progress Notes (Signed)
Subjective: Patient is feels better. Her fever and condition is improving. No fever or chills.  Objective: Vital signs in last 24 hours: Temp:  [97.8 F (36.6 C)-98.2 F (36.8 C)] 98.2 F (36.8 C) (03/09 0551) Pulse Rate:  [78-91] 78 (03/09 0553) Resp:  [18-21] 19 (03/09 0551) BP: (152-196)/(67-93) 181/67 mmHg (03/09 0553) SpO2:  [95 %-99 %] 99 % (03/09 0859) Weight change:  Last BM Date: 01/12/13  Intake/Output from previous day: 03/08 0701 - 03/09 0700 In: 290 [P.O.:240; IV Piggyback:50] Out: 300 [Urine:300]  PHYSICAL EXAM General appearance: alert and no distress Resp: diminished breath sounds bilaterally and rhonchi bilaterally Cardio: irregularly irregular rhythm GI: soft, non-tender; bowel sounds normal; no masses,  no organomegaly Extremities: extremities normal, atraumatic, no cyanosis or edema  Lab Results:    @labtest @ [facility]  Recent Labs  01/11/13 1410  PHART 7.370  PO2ART 95.7  TCO2 25.0  HCO3 27.2*   CULTURES Recent Results (from the past 240 hour(s))  URINE CULTURE     Status: None   Collection Time    01/04/13  2:44 PM      Result Value Range Status   Specimen Description URINE, CLEAN CATCH   Final   Special Requests NONE   Final   Culture  Setup Time 01/05/2013 01:43   Final   Colony Count NO GROWTH   Final   Culture NO GROWTH   Final   Report Status 01/06/2013 FINAL   Final   Studies/Results: Ct Chest Wo Contrast  01/12/2013  *RADIOLOGY REPORT*  Clinical Data: Shortness of breath.  Hypertension.  Coronary artery disease.  Cardiomegaly.  Remote history of benign brain tumor removal.  Current smoker.  CT CHEST WITHOUT CONTRAST  Technique:  Multidetector CT imaging of the chest was performed following the standard protocol without IV contrast.  Comparison: Plain film of 01/11/2013.  No prior CT.  Findings: Lungs/pleura: Mild motion degradation throughout. Moderate centrilobular emphysema. No lobar consolidation.  Trace right-sided pleural fluid.   Heart/Mediastinum: Enlargement of the left lobe of the thyroid, without well-defined dominant mass.  Moderate cardiomegaly, with trace pericardial fluid, likely physiologic. Multivessel coronary artery atherosclerosis.  Mild pulmonary artery enlargement. No mediastinal or definite hilar adenopathy, given limitations of unenhanced CT.  Upper abdomen: Bilateral adrenal thickening, suggesting hyperplasia.  Dense atherosclerosis in the aorta and branch vessels.  Bones/Musculoskeletal:  No acute osseous abnormality.  IMPRESSION:  1.  Moderate centrilobular emphysema and advanced coronary artery disease. 2.  No other explanation for shortness of breath. 3.  Trace right pleural fluid. 4. Pulmonary artery enlargement suggests pulmonary arterial hypertension. 5.  Motion degradation.   Original Report Authenticated By: Jeronimo Greaves, M.D.     Medications: I have reviewed the patient's current medications.  Assesment: 1. Probably TIA 2. Atrial fibrillation, rate controlled  3. Hypertension-not well controlled  4.Hyperlipdemia  5. PVD  6. Tobacco smoking  7. COPD    Active Problems:   Atrial fibrillation   Accelerated hypertension   CVA (cerebral infarction)   Pure hypercholesterolemia   Aortic sclerosis    Plan: Will taper iv steroid   iv antibiotic We will do chest x-ray Neurology consult appreciated Continue telemetry Regular treatment    LOS: 4 days   FANTA,[name] 01/14/2013, 11:03 AM

## 2013-01-14 NOTE — Progress Notes (Signed)
ANTICOAGULATION CONSULT NOTE   Pharmacy Consult for Coumadin Indication: atrial fibrillation  No Known Allergies  Patient Measurements: Height: 5\' 7"  (170.2 cm) Weight: 185 lb (83.915 kg) IBW/kg (Calculated) : 61.6  Vital Signs: Temp: 98.2 F (36.8 C) (03/09 0551) Temp src: Oral (03/09 0551) BP: 181/67 mmHg (03/09 0553) Pulse Rate: 78 (03/09 0553)  Labs:  Recent Labs  01/12/13 0513 01/13/13 0541 01/14/13 0622  HGB 12.8  --   --   HCT 39.8  --   --   PLT 199  --   --   LABPROT 23.8* 34.4* 30.5*  INR 2.24* 3.68* 3.13*   Estimated Creatinine Clearance: 40.9 ml/min (by C-G formula based on Cr of 1.24).  Medical History: Past Medical History  Diagnosis Date  . Hypercholesteremia   . Coronary artery disease   . PVD (peripheral vascular disease)   . Hypertension   . DVT (deep venous thrombosis) 05/2008  . Bilateral iliac artery occlusion   . Aortic valve sclerosis   . Atrial fibrillation    Medications:  Scheduled:  . amLODipine  10 mg Oral Daily  . antiseptic oral rinse  15 mL Mouth Rinse BID  . cefTRIAXone (ROCEPHIN)  IV  1 g Intravenous Q24H  . feeding supplement  1 Container Oral TID BM  . hydrALAZINE  25 mg Oral Q8H  . lisinopril  20 mg Oral Daily   And  . hydrochlorothiazide  25 mg Oral Daily  . [EXPIRED] ipratropium      . ipratropium  0.5 mg Nebulization Q4H WA  . labetalol  300 mg Oral BID  . levalbuterol  0.63 mg Nebulization Q4H WA  . methylPREDNISolone (SOLU-MEDROL) injection  60 mg Intravenous Q6H  . nicotine  21 mg Transdermal Daily  . simvastatin  20 mg Oral QPM  . warfarin  1 mg Oral ONCE-1800  . Warfarin - Pharmacist Dosing Inpatient   Does not apply q1800   Assessment: 79yo female with h/o afib on chronic warfarin 5mg  daily.  She was recently discharged on Xarelto but patient reports she never started the Xarelto and resumed Coumadin after discharge.  Her INR was sub-therapeutic on admission however has now trended above desired goal range  this admission.  No just above goal range after dose held yesterday.  No bleeding noted.    Goal of Therapy:  INR 2-3   Plan:  Coumadin 1mg  po x1 today  INR daily  [place], [facility] 01/14/2013,10:58 AM

## 2013-01-15 LAB — BLOOD GAS, ARTERIAL
FIO2: 0.21 %
O2 Saturation: 94.2 %
Patient temperature: 37
pH, Arterial: 7.357 (ref 7.350–7.450)

## 2013-01-15 LAB — PROTIME-INR
INR: 2.47 — ABNORMAL HIGH (ref 0.00–1.49)
Prothrombin Time: 25.6 seconds — ABNORMAL HIGH (ref 11.6–15.2)

## 2013-01-15 MED ORDER — PREDNISONE (PAK) 10 MG PO TABS: ORAL_TABLET | Freq: Every day | ORAL | Status: DC

## 2013-01-15 MED ORDER — TIOTROPIUM BROMIDE MONOHYDRATE 18 MCG IN CAPS: 18.0000 ug | ORAL_CAPSULE | Freq: Every day | RESPIRATORY_TRACT | Status: DC

## 2013-01-15 MED ORDER — HYDRALAZINE HCL 25 MG PO TABS: 25.0000 mg | ORAL_TABLET | Freq: Three times a day (TID) | ORAL | Status: DC

## 2013-01-15 MED ORDER — ALBUTEROL SULFATE HFA 108 (90 BASE) MCG/ACT IN AERS: 2.0000 | INHALATION_SPRAY | Freq: Four times a day (QID) | RESPIRATORY_TRACT | Status: AC | PRN

## 2013-01-15 MED ORDER — AMOXICILLIN-POT CLAVULANATE 500-125 MG PO TABS: 1.0000 | ORAL_TABLET | Freq: Three times a day (TID) | ORAL | Status: DC

## 2013-01-15 NOTE — Discharge Summary (Signed)
Physician Discharge Summary  Patient ID: JAHNAY LANTIER MRN: [id number] DOB/AGE: 01-02-1931 79 y.o. Primary Care Physician:FANTA,[name], MD Admit date: 01/10/2013 Discharge date: 01/15/2013    Discharg1. Probably TIA  2. Atrial fibrillation, rate controlled  3. Hypertension-not well controlled  4.Hyperlipdemia  5. PVD  6. Tobacco smoking  7. COPD   Active Problems:   Atrial fibrillation   Accelerated hypertension   CVA (cerebral infarction)   Pure hypercholesterolemia   Aortic sclerosis     Medication List    STOP taking these medications       clopidogrel 75 MG tablet  Commonly known as:  PLAVIX     ibuprofen 600 MG tablet  Commonly known as:  ADVIL,MOTRIN     Rivaroxaban 20 MG Tabs  Commonly known as:  XARELTO      TAKE these medications       albuterol 108 (90 BASE) MCG/ACT inhaler  Commonly known as:  PROVENTIL HFA;VENTOLIN HFA  Inhale 2 puffs into the lungs every 6 (six) hours as needed for wheezing.     amLODipine 10 MG tablet  Commonly known as:  NORVASC  Take 10 mg by mouth daily.     amoxicillin-clavulanate 500-125 MG per tablet  Commonly known as:  AUGMENTIN  Take 1 tablet (500 mg total) by mouth 3 (three) times daily.     aspirin EC 81 MG tablet  Take 81 mg by mouth daily.     hydrALAZINE 25 MG tablet  Commonly known as:  APRESOLINE  Take 1 tablet (25 mg total) by mouth every 8 (eight) hours.     labetalol 300 MG tablet  Commonly known as:  NORMODYNE  Take 300 mg by mouth 2 (two) times daily.     predniSONE 10 MG tablet  Commonly known as:  STERAPRED UNI-PAK  Take by mouth daily. 4 tab po daily for 3 days, 3 tab po daily for 3 days, 2 tab po daily for 3 days, 1 tab po daily for 3 days     quinapril-hydrochlorothiazide 20-25 MG per tablet  Commonly known as:  ACCURETIC  Take 1 tablet by mouth daily.     simvastatin 20 MG tablet  Commonly known as:  ZOCOR  Take 20 mg by mouth every evening.     tiotropium 18 MCG inhalation  capsule  Commonly known as:  SPIRIVA HANDIHALER  Place 1 capsule (18 mcg total) into inhaler and inhale daily.     warfarin 5 MG tablet  Commonly known as:  COUMADIN  Take 5 mg by mouth daily.        Discharged Condition: improved    Consults: Neurology  Significant Diagnostic Studies: Dg Chest 2 View  01/11/2013  *RADIOLOGY REPORT*  Clinical Data: Shortness of breath.  Wheezing.  CHEST - 2 VIEW  Comparison: 05/21/2008.  01/04/2013.  Findings: There is stable moderate cardiac silhouette enlargement. Ectasia, tortuosity, and   nonaneurysmal calcification of the thoracic aorta are seen. There is upper lobe vascular prominence without evidence of alveolar edema, consolidation, or pleural effusion formation.  There is slight elevation of the right hemidiaphragm with minimal atelectasis and hazy infiltrate in the right base.  There is minimal degenerative spondylosis.  There is slight flattening of the diaphragm on lateral image which may reflect minimal hyperinflation.  History given of wheezing.  IMPRESSION: Stable moderate cardiac silhouette enlargement.  Upper lobe vascular prominence without evidence of alveolar edema, consolidation, or pleural effusion formation.  Slight elevation right hemidiaphragm with minimal atelectasis and hazy infiltrate  in right base.  History given of wheezing.  Slight flattening of diaphragm on lateral image may reflect minimal hyperinflation.   Original Report Authenticated By: Onalee Hua Call    Ct Head Wo Contrast  01/10/2013  *RADIOLOGY REPORT*  Clinical Data: Left facial droop, bilateral hand numbness, decreased left vision field of view, dizziness, history of prior hemangioma resection, coronary artery disease, hypertension  CT HEAD WITHOUT CONTRAST  Technique:  Contiguous axial images were obtained from the base of the skull through the vertex without contrast.  Comparison: 01/04/2013  Findings: Streak artifacts despite repeating images. Prior frontal craniotomy  with encephalomalacia at the left frontal lobe post resection. Ex vacuo dilatation of the frontal horns of the lateral ventricles. No hydrocephalus, midline shift or mass effect. Old bilateral lacunar infarcts of the basal ganglia. Stable white matter low attenuation within the right frontal lobe as well.  No intracranial hemorrhage, mass lesion, or evidence of acute infarction. No extra-axial fluid collections. Atherosclerotic calcification of internal carotid vertebral arteries bilaterally at skull base. No acute osseous findings.  IMPRESSION: Post frontal craniotomy with again identified encephalomalacia of bilateral frontal lobes greater on the left. Old bilateral basal ganglia lacunar infarcts. No acute intracranial abnormalities.   Original Report Authenticated By: Ulyses Southward, M.D.    Ct Head Wo Contrast  01/04/2013  *RADIOLOGY REPORT*  Clinical Data: History of non malignant brain tumor removal.  Near syncopal episode.  CT HEAD WITHOUT CONTRAST  Technique:  Contiguous axial images were obtained from the base of the skull through the vertex without contrast.  Comparison: None.  Findings: The bifrontal encephalomalacia related to a remote craniotomy, likely for meningioma removal.  No acute stroke, hemorrhage, mass lesion, hydrocephalus, or extra-axial fluid. Remote right basal ganglia infarct.  Moderate vascular calcification.  Hyperostosis.  Unremarkable craniotomy defect. Clear sinuses and mastoids.  IMPRESSION: Postsurgical and post ischemic changes as described.  No acute intracranial findings.   Original Report Authenticated By: Davonna Belling, M.D.    Ct Chest Wo Contrast  01/12/2013  *RADIOLOGY REPORT*  Clinical Data: Shortness of breath.  Hypertension.  Coronary artery disease.  Cardiomegaly.  Remote history of benign brain tumor removal.  Current smoker.  CT CHEST WITHOUT CONTRAST  Technique:  Multidetector CT imaging of the chest was performed following the standard protocol without IV contrast.   Comparison: Plain film of 01/11/2013.  No prior CT.  Findings: Lungs/pleura: Mild motion degradation throughout. Moderate centrilobular emphysema. No lobar consolidation.  Trace right-sided pleural fluid.  Heart/Mediastinum: Enlargement of the left lobe of the thyroid, without well-defined dominant mass.  Moderate cardiomegaly, with trace pericardial fluid, likely physiologic. Multivessel coronary artery atherosclerosis.  Mild pulmonary artery enlargement. No mediastinal or definite hilar adenopathy, given limitations of unenhanced CT.  Upper abdomen: Bilateral adrenal thickening, suggesting hyperplasia.  Dense atherosclerosis in the aorta and branch vessels.  Bones/Musculoskeletal:  No acute osseous abnormality.  IMPRESSION:  1.  Moderate centrilobular emphysema and advanced coronary artery disease. 2.  No other explanation for shortness of breath. 3.  Trace right pleural fluid. 4. Pulmonary artery enlargement suggests pulmonary arterial hypertension. 5.  Motion degradation.   Original Report Authenticated By: Jeronimo Greaves, M.D.    Mr Laqueta Jean Wo Contrast  01/10/2013  *RADIOLOGY REPORT*  Clinical Data: Episode of severe dizziness.  Weakness.  History of meningioma resection 1999.  MRI HEAD WITHOUT AND WITH CONTRAST  Technique:  Multiplanar, multiecho pulse sequences of the brain and surrounding structures were obtained according to standard protocol without and with intravenous contrast  Contrast: 8mL MULTIHANCE GADOBENATE DIMEGLUMINE 529 MG/ML IV SOLN  Comparison: 01/10/2013 head CT.  No comparison brain MR.  Findings: Motion degraded exam.  Fast technique imaging had to be utilized.  The patient was not able to complete T2-weighted axial imaging.  Artifact caused by a metal utilized for frontal craniotomy.  Taking this limitation into account, no acute infarct is noted.  Remote basal ganglia infarcts largest involving the right lenticular nucleus with hemorrhagic breakdown products at this level.  Prior right  frontal craniotomy for meningioma resection. Significant encephalomalacia frontal lobes which is partially hemorrhagic.  On this motion degraded examination, no findings to suggest recurrent meningioma.  Overall, no intracranial mass or abnormal enhancement is noted.  Small vessel disease type changes.  Exophthalmos.  Transverse ligament hypertrophy.  Atrophy without hydrocephalus.  Limited evaluation of intracranial vasculature.  IMPRESSION: Exam limited by motion and metallic artifact without findings of an acute infarct.  Postsurgical changes frontal lobes with encephalomalacia without obvious recurrent tumor.  Please see above discussion   Original Report Authenticated By: Lacy Duverney, M.D.    US Carotid Duplex Bilateral  01/10/2013  *RADIOLOGY REPORT*  Clinical Data: Left facial droop, bilateral hand numbness, dizzy. Coronary artery disease.  Bilateral iliac artery disease. Hypertension, tobacco use.  BILATERAL CAROTID DUPLEX ULTRASOUND  Technique: Wallace Cullens scale imaging, color Doppler and duplex ultrasound was performed of bilateral carotid and vertebral arteries in the neck.  Comparison: 01/05/2013  Criteria:  Quantification of carotid stenosis is based on velocity parameters that correlate the residual internal carotid diameter with NASCET-based stenosis levels, using the diameter of the distal internal carotid lumen as the denominator for stenosis measurement.  The following velocity measurements were obtained:                   PEAK SYSTOLIC/END DIASTOLIC RIGHT ICA:                        60/20cm/sec CCA:                        73/8cm/sec SYSTOLIC ICA/CCA RATIO:     0.83 DIASTOLIC ICA/CCA RATIO:    2.66 ECA:                        18cm/sec  LEFT ICA:                        66/15cm/sec CCA:                        65/7cm/sec SYSTOLIC ICA/CCA RATIO:     1.01 DIASTOLIC ICA/CCA RATIO:    2.28 ECA:                        70cm/sec  Findings:  RIGHT CAROTID ARTERY: There is mild intimal thickening in the common  carotid artery with some eccentric calcified nonocclusive plaque in the distal segment.  There is eccentric irregular partially calcified plaque in the carotid bulb without high-grade stenosis.  Normal wave forms and color Doppler signal.  The distal ICA is mildly tortuous.  RIGHT VERTEBRAL ARTERY:  Normal flow direction and waveform.  LEFT CAROTID ARTERY: Eccentric nonocclusive plaque in the mid and distal common carotid artery.  Eccentric partially calcified plaque in the carotid bulb and proximal ICA without high-grade stenosis. Normal wave forms and color Doppler signal.  LEFT  VERTEBRAL ARTERY:  Normal flow direction and waveform.  IMPRESSION:  1.  Little change from study 1 week ago.  Bilateral eccentric carotid bifurcation plaque resulting in less than 50% diameter stenosis. The exam does not exclude plaque ulceration or embolization.  Continued surveillance recommended.   Original Report Authenticated By: D. Andria Rhein, MD    US Carotid Duplex Bilateral  01/05/2013  *RADIOLOGY REPORT*  Clinical Data: Syncope, hypertension, coronary artery disease, tobacco use.  BILATERAL CAROTID DUPLEX ULTRASOUND  Technique: Wallace Cullens scale imaging, color Doppler and duplex ultrasound was performed of bilateral carotid and vertebral arteries in the neck.  Comparison: None available  Criteria:  Quantification of carotid stenosis is based on velocity parameters that correlate the residual internal carotid diameter with NASCET-based stenosis levels, using the diameter of the distal internal carotid lumen as the denominator for stenosis measurement.  The following velocity measurements were obtained:                   PEAK SYSTOLIC/END DIASTOLIC RIGHT ICA:                        58/16cm/sec CCA:                        57/12cm/sec SYSTOLIC ICA/CCA RATIO:     1.02 DIASTOLIC ICA/CCA RATIO:    1.4 ECA:                        76cm/sec  LEFT ICA:                        59/17cm/sec CCA:                        54/9cm/sec SYSTOLIC ICA/CCA  RATIO:     1.09 DIASTOLIC ICA/CCA RATIO:    1.81 ECA:                        53cm/sec  Findings:  RIGHT CAROTID ARTERY: Eccentric calcified plaque in the distal common carotid artery and bulb without high-grade stenosis.  There is eccentric plaque extending into the lumen of the carotid bulb and proximal ECA.  Normal wave forms and color Doppler signal.  RIGHT VERTEBRAL ARTERY:  Normal flow direction and waveform.  LEFT CAROTID ARTERY: Eccentric calcified nonocclusive plaque in the mid common carotid artery.  There is partially calcified plaque in the carotid bifurcation extending to the proximal ICA without high- grade stenosis.  Normal wave forms and color Doppler signal.  LEFT VERTEBRAL ARTERY:  Normal flow direction and waveform.  IMPRESSION:  1.  Bilateral eccentric calcified carotid bifurcation plaque, resulting in less than 50% diameter stenosis. The exam does not exclude plaque ulceration or embolization.  Continued surveillance recommended.   Original Report Authenticated By: D. Andria Rhein, MD    Dg Chest Portable 1 View  01/04/2013  *RADIOLOGY REPORT*  Clinical Data: Near syncope.  PORTABLE CHEST - 1 VIEW  Comparison: 05/21/2008  Findings: Numerous leads and wires project over the chest.  Midline trachea.  Moderate cardiomegaly.  Tortuous descending thoracic aorta. No pleural effusion or pneumothorax.  No congestive failure.  Clear lungs.  IMPRESSION: Cardiomegaly without congestive failure.   Original Report Authenticated By: Jeronimo Greaves, M.D.     Lab Results: Basic Metabolic Panel: No results found for this basename: NA, K, CL, CO2, GLUCOSE, BUN,  CREATININE, CALCIUM, MG, PHOS,  in the last 72 hours Liver Function Tests: No results found for this basename: AST, ALT, ALKPHOS, BILITOT, PROT, ALBUMIN,  in the last 72 hours   CBC: No results found for this basename: WBC, NEUTROABS, HGB, HCT, MCV, PLT,  in the last 72 hours  No results found for this or any previous visit (from the past 240  hour(s)).   Hospital Course:   This is a 79 years old female patient with history of multiple medical illnesses was admitted due to numbness and facial drooping. She had ct scan and MRI of the head which was negative. Patient was seen by neurology and treated as case of TIA. While in hospital she developed cough, wheezing and shortness of breath. CT of the chest showed emphysemic. Patient was treated and improved. She will discharged in stable condition.  Discharge Exam: Blood pressure 162/74, pulse 78, temperature 97.1 F (36.2 C), temperature source Oral, resp. rate 18, height 5\' 7"  (1.702 m), weight 83.915 kg (185 lb), SpO2 98.00%.   Disposition:  home       Future Appointments Provider Department Dept Phone   01/26/2013 10:20 AM [facility] [place] [facility] [phone]   Patient should wear two piece clothing and wear no powder or deodorant. Patient should arrive 15 minutes early.        Signed: FANTA,[name]   01/15/2013, 8:34 AM

## 2013-01-15 NOTE — Progress Notes (Signed)
UR chart review completed.  

## 2013-01-15 NOTE — Procedures (Signed)
[facility] Kofi A. Gerilyn Pilgrim, MD     [email or web address]        FACILITY: [facility]  PHYSICIAN: Kofi A. Gerilyn Pilgrim, M.D.  DATE OF PROCEDURE: 01-11-13 EEG INTERPRETATION  INDICATION: The patient is a 79 year old female who presents with a spell of confusion and altered mental status. The studies were done to evaluate for nonconvulsive seizures.  MEDICATIONS:  Prior to Admission medications   Medication Sig Start Date End Date Taking? Authorizing Provider  amLODipine (NORVASC) 10 MG tablet Take 10 mg by mouth daily.   Yes Historical Provider, MD  aspirin EC 81 MG tablet Take 81 mg by mouth daily.   Yes Historical Provider, MD  labetalol (NORMODYNE) 300 MG tablet Take 300 mg by mouth 2 (two) times daily.   Yes Historical Provider, MD  quinapril-hydrochlorothiazide (ACCURETIC) 20-25 MG per tablet Take 1 tablet by mouth daily.   Yes Historical Provider, MD  simvastatin (ZOCOR) 20 MG tablet Take 20 mg by mouth every evening.   Yes Historical Provider, MD  warfarin (COUMADIN) 5 MG tablet Take 5 mg by mouth daily.   Yes Historical Provider, MD  albuterol (PROVENTIL HFA;VENTOLIN HFA) 108 (90 BASE) MCG/ACT inhaler Inhale 2 puffs into the lungs every 6 (six) hours as needed for wheezing. 01/15/13   Avon Gully, MD  amoxicillin-clavulanate (AUGMENTIN) 500-125 MG per tablet Take 1 tablet (500 mg total) by mouth 3 (three) times daily. 01/15/13   Avon Gully, MD  hydrALAZINE (APRESOLINE) 25 MG tablet Take 1 tablet (25 mg total) by mouth every 8 (eight) hours. 01/15/13   Avon Gully, MD  predniSONE (STERAPRED UNI-PAK) 10 MG tablet Take by mouth daily. 4 tab po daily for 3 days, 3 tab po daily for 3 days, 2 tab po daily for 3 days, 1 tab po daily for 3 days 01/15/13   Avon Gully, MD  tiotropium (SPIRIVA HANDIHALER) 18 MCG inhalation capsule Place 1 capsule (18 mcg total) into inhaler and inhale daily. 01/15/13   Avon Gully, MD     ANALYSIS: A 16-channel recording using standard 10/20 measurements  is conducted for 21 minutes. There is a well-formed posterior dominant rhythm of 10 Hz and attenuates with eye opening. There is beta activity observed in the frontal areas. Awake and drowsy activities are recorded. Photic stimulation is carried out without abnormal changes in the background activity. Hyperventilation is not done. There is no focal or lateralized slowing. There is no epileptiform activity observed.   IMPRESSION:   This is an abnormal recording of the awake and drowsy states.  Kofi A. Gerilyn Pilgrim, M.D. Diplomate, Biomedical engineer of Psychiatry and Neurology ( Neurology).

## 2013-01-18 NOTE — Plan of Care (Signed)
Problem: Discharge Progression Outcomes Goal: Discharge plan in place and appropriate Outcome: Completed/Met Date Met:  01/15/13 Pt and pt's granddaughter given follow-up appointments and d/c instructions pt and family verbalized understanding of instructions.

## 2013-01-26 ENCOUNTER — Ambulatory Visit (HOSPITAL_COMMUNITY)
Admission: RE | Admit: 2013-01-26 | Discharge: 2013-01-26 | Disposition: A | Discharge status: Home / Self Care | Payer: Medicare Other | Source: Ambulatory Visit | Attending: Internal Medicine | Admitting: Internal Medicine

## 2013-01-26 DIAGNOSIS — Z139 Encounter for screening, unspecified: Secondary | ICD-10-CM

## 2013-02-26 ENCOUNTER — Encounter (HOSPITAL_COMMUNITY): Payer: Self-pay | Admitting: *Deleted

## 2013-02-26 ENCOUNTER — Inpatient Hospital Stay (HOSPITAL_COMMUNITY)
Admission: EM | Admit: 2013-02-26 | Discharge: 2013-02-28 | DRG: 151 | Disposition: A | Discharge status: Home / Self Care | Payer: Medicare Other | Attending: Pulmonary Disease | Admitting: Pulmonary Disease

## 2013-02-26 ENCOUNTER — Emergency Department (HOSPITAL_COMMUNITY): Payer: Medicare Other

## 2013-02-26 DIAGNOSIS — R0600 Dyspnea, unspecified: Secondary | ICD-10-CM

## 2013-02-26 DIAGNOSIS — R791 Abnormal coagulation profile: Secondary | ICD-10-CM

## 2013-02-26 DIAGNOSIS — I359 Nonrheumatic aortic valve disorder, unspecified: Secondary | ICD-10-CM | POA: Diagnosis present

## 2013-02-26 DIAGNOSIS — Z8673 Personal history of transient ischemic attack (TIA), and cerebral infarction without residual deficits: Secondary | ICD-10-CM

## 2013-02-26 DIAGNOSIS — R040 Epistaxis: Principal | ICD-10-CM | POA: Diagnosis present

## 2013-02-26 DIAGNOSIS — E7800 Pure hypercholesterolemia, unspecified: Secondary | ICD-10-CM | POA: Diagnosis present

## 2013-02-26 DIAGNOSIS — I4891 Unspecified atrial fibrillation: Secondary | ICD-10-CM | POA: Diagnosis present

## 2013-02-26 DIAGNOSIS — Z7901 Long term (current) use of anticoagulants: Secondary | ICD-10-CM

## 2013-02-26 DIAGNOSIS — D6832 Hemorrhagic disorder due to extrinsic circulating anticoagulants: Secondary | ICD-10-CM

## 2013-02-26 DIAGNOSIS — I2510 Atherosclerotic heart disease of native coronary artery without angina pectoris: Secondary | ICD-10-CM | POA: Diagnosis present

## 2013-02-26 DIAGNOSIS — T45515A Adverse effect of anticoagulants, initial encounter: Secondary | ICD-10-CM | POA: Diagnosis present

## 2013-02-26 DIAGNOSIS — Z8249 Family history of ischemic heart disease and other diseases of the circulatory system: Secondary | ICD-10-CM

## 2013-02-26 DIAGNOSIS — Z86718 Personal history of other venous thrombosis and embolism: Secondary | ICD-10-CM

## 2013-02-26 DIAGNOSIS — I63032 Cerebral infarction due to thrombosis of left carotid artery: Secondary | ICD-10-CM | POA: Diagnosis present

## 2013-02-26 DIAGNOSIS — IMO0001 Reserved for inherently not codable concepts without codable children: Secondary | ICD-10-CM | POA: Diagnosis present

## 2013-02-26 DIAGNOSIS — I739 Peripheral vascular disease, unspecified: Secondary | ICD-10-CM | POA: Diagnosis present

## 2013-02-26 DIAGNOSIS — Z7982 Long term (current) use of aspirin: Secondary | ICD-10-CM

## 2013-02-26 DIAGNOSIS — Z79899 Other long term (current) drug therapy: Secondary | ICD-10-CM

## 2013-02-26 DIAGNOSIS — E785 Hyperlipidemia, unspecified: Secondary | ICD-10-CM | POA: Diagnosis present

## 2013-02-26 DIAGNOSIS — J441 Chronic obstructive pulmonary disease with (acute) exacerbation: Secondary | ICD-10-CM

## 2013-02-26 DIAGNOSIS — I700 Atherosclerosis of aorta: Secondary | ICD-10-CM | POA: Diagnosis present

## 2013-02-26 DIAGNOSIS — I4821 Permanent atrial fibrillation: Secondary | ICD-10-CM | POA: Diagnosis present

## 2013-02-26 DIAGNOSIS — J449 Chronic obstructive pulmonary disease, unspecified: Secondary | ICD-10-CM | POA: Diagnosis present

## 2013-02-26 DIAGNOSIS — F17200 Nicotine dependence, unspecified, uncomplicated: Secondary | ICD-10-CM | POA: Diagnosis present

## 2013-02-26 LAB — HEMOGLOBIN
Hemoglobin: 10.5 g/dL — ABNORMAL LOW (ref 12.0–15.0)
Hemoglobin: 9.9 g/dL — ABNORMAL LOW (ref 12.0–15.0)
Hemoglobin: 10.2 g/dL — ABNORMAL LOW (ref 12.0–15.0)

## 2013-02-26 LAB — PRO B NATRIURETIC PEPTIDE: Pro B Natriuretic peptide (BNP): 2575 pg/mL — ABNORMAL HIGH (ref 0–450)

## 2013-02-26 LAB — CBC WITH DIFFERENTIAL/PLATELET
Basophils Absolute: 0 10*3/uL (ref 0.0–0.1)
Basophils Relative: 0 % (ref 0–1)
MCHC: 32.7 g/dL (ref 30.0–36.0)
Neutro Abs: 10.3 10*3/uL — ABNORMAL HIGH (ref 1.7–7.7)
Neutrophils Relative %: 78 % — ABNORMAL HIGH (ref 43–77)
Platelets: 308 10*3/uL (ref 150–400)
RDW: 15.6 % — ABNORMAL HIGH (ref 11.5–15.5)

## 2013-02-26 LAB — BASIC METABOLIC PANEL
BUN: 51 mg/dL — ABNORMAL HIGH (ref 6–23)
Creatinine, Ser: 1.59 mg/dL — ABNORMAL HIGH (ref 0.50–1.10)
GFR calc Af Amer: 35 mL/min — ABNORMAL LOW (ref >90)
GFR calc non Af Amer: 30 mL/min — ABNORMAL LOW (ref >90)
Potassium: 3.8 mEq/L (ref 3.5–5.1)

## 2013-02-26 LAB — PROTIME-INR
INR: 8.44 (ref 0.00–1.49)
Prothrombin Time: 63.6 seconds — ABNORMAL HIGH (ref 11.6–15.2)

## 2013-02-26 LAB — MRSA PCR SCREENING: MRSA by PCR: NEGATIVE

## 2013-02-26 LAB — POCT I-STAT TROPONIN I: Troponin i, poc: 0 ng/mL (ref 0.00–0.08)

## 2013-02-26 LAB — HEMATOCRIT: HCT: 31.5 % — ABNORMAL LOW (ref 36.0–46.0)

## 2013-02-26 MED ORDER — SODIUM CHLORIDE 0.9 % IJ SOLN
3.0000 mL | Freq: Two times a day (BID) | INTRAMUSCULAR | Status: DC
  Administered 2013-02-26 – 2013-02-27 (×3): 3 mL via INTRAVENOUS

## 2013-02-26 MED ORDER — SODIUM CHLORIDE 0.9 % IV SOLN: 250.0000 mL | INTRAVENOUS | Status: DC | PRN

## 2013-02-26 MED ORDER — HYDROCODONE-ACETAMINOPHEN 5-325 MG PO TABS: 1.0000 | ORAL_TABLET | ORAL | Status: DC | PRN

## 2013-02-26 MED ORDER — LISINOPRIL 10 MG PO TABS
20.0000 mg | ORAL_TABLET | Freq: Every day | ORAL | Status: DC
  Administered 2013-02-26 – 2013-02-28 (×3): 20 mg via ORAL
  Filled 2013-02-26 (×4): qty 2

## 2013-02-26 MED ORDER — TRAZODONE HCL 50 MG PO TABS
25.0000 mg | ORAL_TABLET | Freq: Every evening | ORAL | Status: DC | PRN
  Administered 2013-02-26: 25 mg via ORAL
  Filled 2013-02-26: qty 1

## 2013-02-26 MED ORDER — ONDANSETRON HCL 4 MG/2ML IJ SOLN: 4.0000 mg | Freq: Four times a day (QID) | INTRAMUSCULAR | Status: DC | PRN

## 2013-02-26 MED ORDER — ALBUTEROL SULFATE (5 MG/ML) 0.5% IN NEBU
5.0000 mg | INHALATION_SOLUTION | Freq: Once | RESPIRATORY_TRACT | Status: AC
  Administered 2013-02-26: 5 mg via RESPIRATORY_TRACT
  Filled 2013-02-26: qty 1

## 2013-02-26 MED ORDER — ONDANSETRON HCL 4 MG/2ML IJ SOLN: 4.0000 mg | Freq: Three times a day (TID) | INTRAMUSCULAR | Status: AC | PRN

## 2013-02-26 MED ORDER — SIMVASTATIN 20 MG PO TABS
20.0000 mg | ORAL_TABLET | Freq: Every evening | ORAL | Status: DC
  Administered 2013-02-26 – 2013-02-28 (×3): 20 mg via ORAL
  Filled 2013-02-26 (×3): qty 1

## 2013-02-26 MED ORDER — QUINAPRIL-HYDROCHLOROTHIAZIDE 20-25 MG PO TABS: 1.0000 | ORAL_TABLET | Freq: Every day | ORAL | Status: DC

## 2013-02-26 MED ORDER — TIOTROPIUM BROMIDE MONOHYDRATE 18 MCG IN CAPS
18.0000 ug | ORAL_CAPSULE | Freq: Every day | RESPIRATORY_TRACT | Status: DC
  Administered 2013-02-27: 18 ug via RESPIRATORY_TRACT
  Filled 2013-02-26 (×2): qty 5

## 2013-02-26 MED ORDER — ACETAMINOPHEN 650 MG RE SUPP: 650.0000 mg | Freq: Four times a day (QID) | RECTAL | Status: DC | PRN

## 2013-02-26 MED ORDER — ONDANSETRON HCL 4 MG PO TABS: 4.0000 mg | ORAL_TABLET | Freq: Four times a day (QID) | ORAL | Status: DC | PRN

## 2013-02-26 MED ORDER — IPRATROPIUM BROMIDE 0.02 % IN SOLN
0.5000 mg | Freq: Once | RESPIRATORY_TRACT | Status: AC
  Administered 2013-02-26: 0.5 mg via RESPIRATORY_TRACT
  Filled 2013-02-26: qty 2.5

## 2013-02-26 MED ORDER — SODIUM CHLORIDE 0.9 % IV SOLN
INTRAVENOUS | Status: AC
  Administered 2013-02-26: 15:00:00 via INTRAVENOUS

## 2013-02-26 MED ORDER — ALUM & MAG HYDROXIDE-SIMETH 200-200-20 MG/5ML PO SUSP: 30.0000 mL | Freq: Four times a day (QID) | ORAL | Status: DC | PRN

## 2013-02-26 MED ORDER — ALBUTEROL SULFATE (5 MG/ML) 0.5% IN NEBU
2.5000 mg | INHALATION_SOLUTION | RESPIRATORY_TRACT | Status: AC | PRN
  Administered 2013-02-26: 2.5 mg via RESPIRATORY_TRACT
  Filled 2013-02-26: qty 0.5

## 2013-02-26 MED ORDER — HYDROCHLOROTHIAZIDE 25 MG PO TABS
25.0000 mg | ORAL_TABLET | Freq: Every day | ORAL | Status: DC
  Administered 2013-02-26 – 2013-02-28 (×3): 25 mg via ORAL
  Filled 2013-02-26 (×6): qty 1

## 2013-02-26 MED ORDER — HEPARIN (PORCINE) IN NACL 100-0.45 UNIT/ML-% IJ SOLN
1000.0000 [IU]/h | INTRAMUSCULAR | Status: DC
  Administered 2013-02-26: 1000 [IU]/h via INTRAVENOUS
  Filled 2013-02-26: qty 250

## 2013-02-26 MED ORDER — PHYTONADIONE 5 MG PO TABS
5.0000 mg | ORAL_TABLET | Freq: Once | ORAL | Status: AC
  Administered 2013-02-26: 5 mg via ORAL
  Filled 2013-02-26: qty 1

## 2013-02-26 MED ORDER — AMLODIPINE BESYLATE 5 MG PO TABS
10.0000 mg | ORAL_TABLET | Freq: Every day | ORAL | Status: DC
  Administered 2013-02-26 – 2013-02-28 (×3): 10 mg via ORAL
  Filled 2013-02-26: qty 2
  Filled 2013-02-26 (×2): qty 1
  Filled 2013-02-26: qty 2

## 2013-02-26 MED ORDER — LABETALOL HCL 200 MG PO TABS
300.0000 mg | ORAL_TABLET | Freq: Two times a day (BID) | ORAL | Status: DC
  Administered 2013-02-26 – 2013-02-28 (×5): 300 mg via ORAL
  Filled 2013-02-26 (×2): qty 3
  Filled 2013-02-26: qty 1
  Filled 2013-02-26: qty 2
  Filled 2013-02-26: qty 1
  Filled 2013-02-26: qty 3
  Filled 2013-02-26: qty 1
  Filled 2013-02-26: qty 2
  Filled 2013-02-26 (×2): qty 3

## 2013-02-26 MED ORDER — HEPARIN BOLUS VIA INFUSION
4000.0000 [IU] | Freq: Once | INTRAVENOUS | Status: AC
  Administered 2013-02-26: 4000 [IU] via INTRAVENOUS

## 2013-02-26 MED ORDER — SODIUM CHLORIDE 0.9 % IJ SOLN: 3.0000 mL | INTRAMUSCULAR | Status: DC | PRN

## 2013-02-26 MED ORDER — HYDROMORPHONE HCL PF 1 MG/ML IJ SOLN: 0.5000 mg | INTRAMUSCULAR | Status: DC | PRN

## 2013-02-26 MED ORDER — HYDRALAZINE HCL 25 MG PO TABS
50.0000 mg | ORAL_TABLET | Freq: Two times a day (BID) | ORAL | Status: DC
  Administered 2013-02-26 – 2013-02-28 (×5): 50 mg via ORAL
  Filled 2013-02-26: qty 1
  Filled 2013-02-26 (×5): qty 2
  Filled 2013-02-26: qty 1
  Filled 2013-02-26 (×3): qty 2

## 2013-02-26 MED ORDER — ACETAMINOPHEN 325 MG PO TABS
650.0000 mg | ORAL_TABLET | Freq: Four times a day (QID) | ORAL | Status: DC | PRN
  Filled 2013-02-26: qty 2

## 2013-02-26 NOTE — ED Notes (Signed)
CRITICAL VALUE ALERT  Critical value received:  INR 8.44, PT 63.6  Date of notification: 02/24/13  Time of notification:  900  Critical value read back:yes  Nurse who received alert:  Lyn Records  MD notified (1st page):  Dr. Hilda Blades  Time of first page:  900  MD notified (2nd page):  Time of second page:  Responding MD:  Dr. Hilda Blades  Time MD responded:  900

## 2013-02-26 NOTE — ED Provider Notes (Signed)
History  This chart was scribed for Nelia Shi, MD by Shari Heritage, ED Scribe. The patient was seen in room [facility]. Patient's care was started at 0749.   CSN: [id number]  Arrival date & time 02/26/13  3086   First MD Initiated Contact with Patient 02/26/13 [phone]      Chief Complaint  Patient presents with  . Shortness of Breath     The history is provided by the patient. No language interpreter was used.     HPI Comments: [name] is a 79 y.o. female with history of DVT, atrial fibrillation, hypertension, hypercholesteremia, coronary artery disease, emphysema/COPD who presents to the Emergency Department complaining of left, mild to moderate epistaxis onset this morning. Patient states that she woke up with blood on her pillow. She says that she was recently admitted to the hospital to treat asthma and COPD. Patient also reports that this morning, she is having increased shortness of breath and wheezing. Patient has been prescribed prednisone, albuterol and Spiriva in the past to treat shortness of breath. Patient denies chest pain, leg swelling or any other symptoms at this time. Patient is a current every smoker. She does not use alcohol.   Past Medical History  Diagnosis Date  . Hypercholesteremia   . Coronary artery disease   . PVD (peripheral vascular disease)   . Hypertension   . DVT (deep venous thrombosis) 05/2008  . Bilateral iliac artery occlusion   . Aortic valve sclerosis   . Atrial fibrillation     Past Surgical History  Procedure Laterality Date  . Iliac artery stent Left 05/2008    Dr. Allyson Sabal  . Iliac artery stent Right 2010    Dr. Allyson Sabal  . Brain tumor excision  1999    for hemangioma [facility])  . Cataract extraction Bilateral   . Lens replacement Left     Family History  Problem Relation Age of Onset  . Hypertension Mother   . Heart attack Father     History  Substance Use Topics  . Smoking status: Current Every Day Smoker -- 0.50  packs/day for 35 years    Types: Cigarettes  . Smokeless tobacco: Not on file  . Alcohol Use: No    OB History   Grav Para Term Preterm Abortions TAB SAB Ect Mult Living                  Review of Systems A complete 10 system review of systems was obtained and all systems are negative except as noted in the HPI and PMH.   Allergies  Review of patient's allergies indicates no known allergies.  Home Medications   Current Outpatient Rx  Name  Route  Sig  Dispense  Refill  . albuterol (PROVENTIL HFA;VENTOLIN HFA) 108 (90 BASE) MCG/ACT inhaler   Inhalation   Inhale 2 puffs into the lungs every 6 (six) hours as needed for wheezing.   1 Inhaler   2   . amLODipine (NORVASC) 10 MG tablet   Oral   Take 10 mg by mouth daily.         Marland Kitchen aspirin EC 81 MG tablet   Oral   Take 81 mg by mouth daily.         Marland Kitchen azithromycin (ZITHROMAX) 250 MG tablet   Oral   Take 250 mg by mouth daily. Take 2 tablets on day 1, then take 1 tablet daily for days 2-5.         . hydrALAZINE (  APRESOLINE) 50 MG tablet   Oral   Take 50 mg by mouth 2 (two) times daily.         Marland Kitchen labetalol (NORMODYNE) 300 MG tablet   Oral   Take 300 mg by mouth 2 (two) times daily.         . predniSONE (STERAPRED UNI-PAK) 10 MG tablet   Oral   Take by mouth daily. 4 tab po daily for 3 days, 3 tab po daily for 3 days, 2 tab po daily for 3 days, 1 tab po daily for 3 days   30 tablet   0   . quinapril-hydrochlorothiazide (ACCURETIC) 20-25 MG per tablet   Oral   Take 1 tablet by mouth daily.         . simvastatin (ZOCOR) 20 MG tablet   Oral   Take 20 mg by mouth every evening.         . tiotropium (SPIRIVA HANDIHALER) 18 MCG inhalation capsule   Inhalation   Place 1 capsule (18 mcg total) into inhaler and inhale daily.   30 capsule   12   . warfarin (COUMADIN) 5 MG tablet   Oral   Take 5 mg by mouth daily.           Triage Vitals: BP 179/87  Pulse 86  Temp(Src) 97.8 F (36.6 C) (Oral)   Resp 24  Ht 5' 7.5" (1.715 m)  Wt 188 lb (85.276 kg)  BMI 28.99 kg/m2  SpO2 100%  Physical Exam  Nursing note and vitals reviewed. Constitutional: She is oriented to person, place, and time. She appears well-developed and well-nourished. No distress.  HENT:  Head: Normocephalic and atraumatic.  Nose: No epistaxis.  Eyes: Pupils are equal, round, and reactive to light.  Neck: Normal range of motion.  Cardiovascular: Normal rate and intact distal pulses.   Pulmonary/Chest: No respiratory distress. She has wheezes (Scattered expiratory).  Abdominal: Normal appearance. She exhibits no distension.  Musculoskeletal: Normal range of motion.  Neurological: She is alert and oriented to person, place, and time. No cranial nerve deficit.  Skin: Skin is warm and dry. No rash noted.  Psychiatric: She has a normal mood and affect. Her behavior is normal.    ED Course  Procedures (including critical care time) DIAGNOSTIC STUDIES: Oxygen Saturation is 100% on room air, normal by my interpretation.    COORDINATION OF CARE: 7:55 AM- Patient informed of current plan for treatment and evaluation and agrees with plan at this time.    Patient mistakenly was given a small amount of heparin infusion but it was stopped within a few minutes of starting.  No significant amount of heparin was given.  Meds ordered this encounter  Medications  . phytonadione (VITAMIN K) tablet 5 mg    Sig:      Meds ordered this encounter  Medications     Date: 02/26/2013  Rate: 82  Rhythm: Atrial fibrillation  QRS Axis: normal  Intervals: normal  ST/T Wave abnormalities: normal  Conduction Disutrbances: none  Narrative Interpretation: No change from previous tracing except abnormal T wave changes in V6 have resolved      Labs Reviewed  PRO B NATRIURETIC PEPTIDE - Abnormal; Notable for the following:    Pro B Natriuretic peptide (BNP) 2575.0 (*)    All other components within normal limits   PROTIME-INR - Abnormal; Notable for the following:    Prothrombin Time 63.6 (*)    INR 8.44 (*)    All other components  within normal limits  BASIC METABOLIC PANEL - Abnormal; Notable for the following:    Glucose, Bld 127 (*)    BUN 51 (*)    Creatinine, Ser 1.59 (*)    GFR calc non Af Amer 30 (*)    GFR calc Af Amer 35 (*)    All other components within normal limits  CBC WITH DIFFERENTIAL - Abnormal; Notable for the following:    WBC 13.1 (*)    RBC 3.72 (*)    Hemoglobin 10.5 (*)    HCT 32.1 (*)    RDW 15.6 (*)    Neutrophils Relative 78 (*)    Neutro Abs 10.3 (*)    Lymphocytes Relative 10 (*)    Monocytes Absolute 1.5 (*)    All other components within normal limits  POCT I-STAT TROPONIN I    Dg Chest 2 View  02/26/2013  *RADIOLOGY REPORT*  Clinical Data: Shortness of breath  CHEST - 2 VIEW  Comparison: 01/11/2013  Findings: The cardiac shadow remains mildly enlarged.  The lungs are well aerated without focal infiltrate.  Mild central vascular congestion is seen.  No significant edema is noted.  IMPRESSION: Increase in central pulmonary vascular congestion.  No focal infiltrate is seen.   Original Report Authenticated By: Alcide Clever, M.D.      1. Epistaxis   2. Supratherapeutic INR   3. Dyspnea       MDM  I personally performed the services described in this documentation, which was scribed in my presence. The recorded information has been reviewed and considered.    Nelia Shi, MD 02/26/13 1012

## 2013-02-26 NOTE — ED Notes (Signed)
Not all signed and held meds in ED pyxis. Phamracy notifed and is bringing them down. Pt aware and verbalized understanding.

## 2013-02-26 NOTE — ED Notes (Signed)
Recently d/c from hospital for emphysema - sob and wheezing started this morning.  Pt also reporting nose bleeds.  Woke up this morning with blood on her pillow.

## 2013-02-27 DIAGNOSIS — J449 Chronic obstructive pulmonary disease, unspecified: Secondary | ICD-10-CM | POA: Diagnosis present

## 2013-02-27 DIAGNOSIS — D6832 Hemorrhagic disorder due to extrinsic circulating anticoagulants: Secondary | ICD-10-CM

## 2013-02-27 DIAGNOSIS — R040 Epistaxis: Principal | ICD-10-CM | POA: Diagnosis present

## 2013-02-27 LAB — CBC
Hemoglobin: 9.3 g/dL — ABNORMAL LOW (ref 12.0–15.0)
MCHC: 31.8 g/dL (ref 30.0–36.0)
WBC: 11.6 10*3/uL — ABNORMAL HIGH (ref 4.0–10.5)

## 2013-02-27 LAB — PROTIME-INR: INR: 3.93 — ABNORMAL HIGH (ref 0.00–1.49)

## 2013-02-27 MED ORDER — WARFARIN - PHARMACIST DOSING INPATIENT: Status: DC

## 2013-02-27 MED ORDER — PREDNISONE 20 MG PO TABS
50.0000 mg | ORAL_TABLET | Freq: Every day | ORAL | Status: DC
  Administered 2013-02-27 – 2013-02-28 (×2): 50 mg via ORAL
  Filled 2013-02-27 (×2): qty 1
  Filled 2013-02-27: qty 2

## 2013-02-27 MED ORDER — BIOTENE DRY MOUTH MT LIQD
15.0000 mL | Freq: Two times a day (BID) | OROMUCOSAL | Status: DC
  Administered 2013-02-27 (×2): 15 mL via OROMUCOSAL

## 2013-02-27 MED ORDER — DEXTROSE 5 % IV SOLN
1.0000 g | INTRAVENOUS | Status: DC
  Administered 2013-02-27: 1 g via INTRAVENOUS
  Filled 2013-02-27 (×3): qty 10

## 2013-02-27 NOTE — Progress Notes (Signed)
UR Chart Review Completed  

## 2013-02-27 NOTE — H&P (Signed)
NAME:  [name], BHATTACHARYYA          ACCOUNT NO.:  [id number]  MEDICAL RECORD NO.:  [id number]  LOCATION:  [facility]                         FACILITY:  [facility]  PHYSICIAN:  Tiffanyann Deroo L. Juanetta Gosling, M.D.DATE OF BIRTH:  Apr 19, 1931  DATE OF ADMISSION:  02/26/2013 DATE OF DISCHARGE:  LH                             HISTORY & PHYSICAL   REASON FOR ADMISSION:  Coagulopathy from Coumadin.  HISTORY OF PRESENT ILLNESS:  This is a 79 year old who has a history of DVT, atrial fibrillation, hypertension, hyperlipidemia, coronary disease, and COPD.  She has noted that she had a nosebleed this morning. She says she has been in the hospital with asthma and COPD.  She has been having some shortness of breath and wheezing.  When she came to the emergency room, she was found to have an INR of greater than 8.  PAST MEDICAL HISTORY:  Positive for hyperlipidemia, coronary artery occlusive disease, peripheral arterial disease, hypertension, DVT.  She has had bilateral iliac artery and aortic stenosis, and atrial fibrillation.  PAST SURGICAL HISTORY:  Surgically, she has had an iliac artery stent bilaterally, a hemangioma removed in 1999, cataract surgery.  FAMILY HISTORY:  Her mother had severe hypertension and her father had a heart attack.  SOCIAL HISTORY:  She has about a 35-pack year smoking history.  She does not use any alcohol.  REVIEW OF SYSTEMS:  Except as mentioned is negative.  MEDICATIONS:  At home are Ventolin inhaler every 6 hours as needed, amlodipine 10 mg daily, aspirin 81 mg daily, Zithromax which she would finish today, hydralazine 50 mg b.i.d., labetalol 300 mg b.i.d., prednisone in a tapering dose, Accuretic 20/25 daily, Zocor 20 mg daily, Spiriva daily, Coumadin 5 mg my.  PHYSICAL EXAMINATION:  HEENT:  Her pupils are reactive.  She has some dry blood in her nose, in both nares. NECK:  Supple. CHEST:  Relatively clear. HEART:  Regular. ABDOMEN:  Soft. EXTREMITIES:  No edema. CENTRAL  NERVOUS SYSTEM:  Grossly intact.  ASSESSMENT:  She had a nosebleed which has stopped, but she has significant problems with coagulopathy related to Coumadin.  PLAN:  She has had vitamin K already.  She will continue with her other treatments.  She will need to be watched carefully for signs of gastrointestinal bleeding.  No other new treatments now.     [name] L. Juanetta Gosling, M.D.     ELH/MEDQ  D:  02/26/2013  T:  02/27/2013  Job:  [id number]

## 2013-02-27 NOTE — Progress Notes (Signed)
ANTICOAGULATION CONSULT NOTE - Initial Consult  Pharmacy Consult for Warfarin (chronic PTA) Indication: afib and h/o DVT  No Known Allergies  Patient Measurements: Height: 5\' 7"  (170.2 cm) Weight: 227 lb (102.967 kg) IBW/kg (Calculated) : 61.6  Vital Signs: Temp: 97.5 F (36.4 C) (04/22 0730) Temp src: Axillary (04/22 0730) BP: 117/74 mmHg (04/22 0934) Pulse Rate: 66 (04/22 0600)  Labs:  Recent Labs  02/26/13 0806  02/26/13 1948 02/26/13 2258 02/27/13 0427  HGB 10.5*  < > 9.9* 10.2* 9.3*  HCT 32.1*  < > 30.6* 31.5* 29.2*  PLT 308  --   --   --  242  LABPROT 63.6*  --   --   --  36.1*  INR 8.44*  --   --   --  3.93*  CREATININE 1.59*  --   --   --   --   < > = values in this interval not displayed.  Estimated Creatinine Clearance: 35.4 ml/min (by C-G formula based on Cr of 1.59).  Medical History: Past Medical History  Diagnosis Date  . Hypercholesteremia   . Coronary artery disease   . PVD (peripheral vascular disease)   . Hypertension   . DVT (deep venous thrombosis) 05/2008  . Bilateral iliac artery occlusion   . Aortic valve sclerosis   . Atrial fibrillation    Medications:  Prescriptions prior to admission  Medication Sig Dispense Refill  . albuterol (PROVENTIL HFA;VENTOLIN HFA) 108 (90 BASE) MCG/ACT inhaler Inhale 2 puffs into the lungs every 6 (six) hours as needed for wheezing.  1 Inhaler  2  . amLODipine (NORVASC) 10 MG tablet Take 10 mg by mouth daily.      Marland Kitchen aspirin EC 81 MG tablet Take 81 mg by mouth daily.      Marland Kitchen azithromycin (ZITHROMAX) 250 MG tablet Take 250 mg by mouth daily. Take 2 tablets on day 1, then take 1 tablet daily for days 2-5.      . hydrALAZINE (APRESOLINE) 50 MG tablet Take 50 mg by mouth 2 (two) times daily.      Marland Kitchen labetalol (NORMODYNE) 300 MG tablet Take 300 mg by mouth 2 (two) times daily.      . predniSONE (STERAPRED UNI-PAK) 10 MG tablet Take by mouth daily. 4 tab po daily for 3 days, 3 tab po daily for 3 days, 2 tab po daily  for 3 days, 1 tab po daily for 3 days  30 tablet  0  . quinapril-hydrochlorothiazide (ACCURETIC) 20-25 MG per tablet Take 1 tablet by mouth daily.      . simvastatin (ZOCOR) 20 MG tablet Take 20 mg by mouth every evening.      . tiotropium (SPIRIVA HANDIHALER) 18 MCG inhalation capsule Place 1 capsule (18 mcg total) into inhaler and inhale daily.  30 capsule  12  . warfarin (COUMADIN) 5 MG tablet Take 5 mg by mouth daily.        Assessment: 79yo female who is maintained on chronic Coumadin 5mg  daily for h/o DVT and afib.  Pt presented to hospital c/o epistaxis with blood on her pillow when she woke up.  INR was > 8 on admission and pt was given Vitamin K 5mg  PO per MD.  INR is now 3.93.    Goal of Therapy:  INR 2-3 Monitor platelets by anticoagulation protocol: Yes   Plan:  HOLD coumadin today INR daily CBC 3 times weekly  Valrie Hart A 02/27/2013,2:01 PM

## 2013-02-27 NOTE — Care Management Note (Signed)
   CARE MANAGEMENT NOTE 02/27/2013  Patient:  [name], [name]   Account Number:  [id number]  Date Initiated:  02/27/2013  Documentation initiated by:  Sharrie Rothman  Subjective/Objective Assessment:   Pt admitted from home with COPD and epistaxis. Pt lives alone but has 2 sons and 2 sisters who check on pt frequently. Pts daughters live in [place]. Pt is independent with ADL's. Coumadin followed by [facility].     Action/Plan:   No CM needs noted.   Anticipated DC Date:  02/28/2013   Anticipated DC Plan:  HOME/SELF CARE      DC Planning Services  CM consult      Choice offered to / List presented to:             Status of service:  Completed, signed off Medicare Important Message given?   (If response is "NO", the following Medicare IM given date fields will be blank) Date Medicare IM given:   Date Additional Medicare IM given:    Discharge Disposition:    Per UR Regulation:    If discussed at Long Length of Stay Meetings, dates discussed:    Comments:  02/27/13 1540 Arlyss Queen, RN BSN CM

## 2013-02-27 NOTE — Progress Notes (Signed)
Subjective: She is awake and alert but having some breathing difficulty. She has no other new complaints. She is not coughing. She has not noted any bleeding.  Objective: Vital signs in last 24 hours: Temp:  [96.6 F (35.9 C)-98.6 F (37 C)] 98.6 F (37 C) (04/22 0402) Pulse Rate:  [59-101] 66 (04/22 0600) Resp:  [19-25] 22 (04/22 0600) BP: (100-177)/(37-95) 140/75 mmHg (04/22 0500) SpO2:  [66 %-100 %] 97 % (04/22 0837) Weight:  [95.3 kg (210 lb 1.6 oz)-102.967 kg (227 lb)] 102.967 kg (227 lb) (04/22 0500) Weight change:  Last BM Date: 02/26/13  Intake/Output from previous day: 04/21 0701 - 04/22 0700 In: 602.1 [I.V.:602.1] Out: 800 [Urine:800]  PHYSICAL EXAM General appearance: alert, cooperative and mild distress Resp: wheezes bilaterally Cardio: regular rate and rhythm, S1, S2 normal, no murmur, click, rub or gallop GI: soft, non-tender; bowel sounds normal; no masses,  no organomegaly Extremities: extremities normal, atraumatic, no cyanosis or edema  Lab Results:    Basic Metabolic Panel:  Recent Labs  21/30/86 0806  NA 141  K 3.8  CL 104  CO2 28  GLUCOSE 127*  BUN 51*  CREATININE 1.59*  CALCIUM 9.5   Liver Function Tests: No results found for this basename: AST, ALT, ALKPHOS, BILITOT, PROT, ALBUMIN,  in the last 72 hours No results found for this basename: LIPASE, AMYLASE,  in the last 72 hours No results found for this basename: AMMONIA,  in the last 72 hours CBC:  Recent Labs  02/26/13 0806  02/26/13 2258 02/27/13 0427  WBC 13.1*  --   --  11.6*  NEUTROABS 10.3*  --   --   --   HGB 10.5*  < > 10.2* 9.3*  HCT 32.1*  < > 31.5* 29.2*  MCV 86.3  --   --  87.7  PLT 308  --   --  242  < > = values in this interval not displayed. Cardiac Enzymes: No results found for this basename: CKTOTAL, CKMB, CKMBINDEX, TROPONINI,  in the last 72 hours BNP:  Recent Labs  02/26/13 0806  PROBNP 2575.0*   D-Dimer: No results found for this basename: DDIMER,   in the last 72 hours CBG: No results found for this basename: GLUCAP,  in the last 72 hours Hemoglobin A1C: No results found for this basename: HGBA1C,  in the last 72 hours Fasting Lipid Panel: No results found for this basename: CHOL, HDL, LDLCALC, TRIG, CHOLHDL, LDLDIRECT,  in the last 72 hours Thyroid Function Tests: No results found for this basename: TSH, T4TOTAL, FREET4, T3FREE, THYROIDAB,  in the last 72 hours Anemia Panel: No results found for this basename: VITAMINB12, FOLATE, FERRITIN, TIBC, IRON, RETICCTPCT,  in the last 72 hours Coagulation:  Recent Labs  02/26/13 0806 02/27/13 0427  LABPROT 63.6* 36.1*  INR 8.44* 3.93*   Urine Drug Screen: Drugs of Abuse     Component Value Date/Time   LABOPIA NONE DETECTED 01/10/2013 1154   COCAINSCRNUR NONE DETECTED 01/10/2013 1154   LABBENZ NONE DETECTED 01/10/2013 1154   AMPHETMU NONE DETECTED 01/10/2013 1154   THCU NONE DETECTED 01/10/2013 1154   LABBARB NONE DETECTED 01/10/2013 1154    Alcohol Level: No results found for this basename: ETH,  in the last 72 hours Urinalysis: No results found for this basename: COLORURINE, APPERANCEUR, LABSPEC, PHURINE, GLUCOSEU, HGBUR, BILIRUBINUR, KETONESUR, PROTEINUR, UROBILINOGEN, NITRITE, LEUKOCYTESUR,  in the last 72 hours Misc. Labs:  ABGS No results found for this basename: PHART, PCO2, PO2ART, TCO2, HCO3,  in the last 72 hours CULTURES Recent Results (from the past 240 hour(s))  MRSA PCR SCREENING     Status: None   Collection Time    02/26/13  5:21 PM      Result Value Range Status   MRSA by PCR NEGATIVE  NEGATIVE Final   Comment:            The GeneXpert MRSA Assay (FDA     approved for NASAL specimens     only), is one component of a     comprehensive MRSA colonization     surveillance program. It is not     intended to diagnose MRSA     infection nor to guide or     monitor treatment for     MRSA infections.   Studies/Results: Dg Chest 2 View  02/26/2013  *RADIOLOGY  REPORT*  Clinical Data: Shortness of breath  CHEST - 2 VIEW  Comparison: 01/11/2013  Findings: The cardiac shadow remains mildly enlarged.  The lungs are well aerated without focal infiltrate.  Mild central vascular congestion is seen.  No significant edema is noted.  IMPRESSION: Increase in central pulmonary vascular congestion.  No focal infiltrate is seen.   Original Report Authenticated By: Alcide Clever, M.D.     Medications:  Prior to Admission:  Prescriptions prior to admission  Medication Sig Dispense Refill  . albuterol (PROVENTIL HFA;VENTOLIN HFA) 108 (90 BASE) MCG/ACT inhaler Inhale 2 puffs into the lungs every 6 (six) hours as needed for wheezing.  1 Inhaler  2  . amLODipine (NORVASC) 10 MG tablet Take 10 mg by mouth daily.      Marland Kitchen aspirin EC 81 MG tablet Take 81 mg by mouth daily.      Marland Kitchen azithromycin (ZITHROMAX) 250 MG tablet Take 250 mg by mouth daily. Take 2 tablets on day 1, then take 1 tablet daily for days 2-5.      . hydrALAZINE (APRESOLINE) 50 MG tablet Take 50 mg by mouth 2 (two) times daily.      Marland Kitchen labetalol (NORMODYNE) 300 MG tablet Take 300 mg by mouth 2 (two) times daily.      . predniSONE (STERAPRED UNI-PAK) 10 MG tablet Take by mouth daily. 4 tab po daily for 3 days, 3 tab po daily for 3 days, 2 tab po daily for 3 days, 1 tab po daily for 3 days  30 tablet  0  . quinapril-hydrochlorothiazide (ACCURETIC) 20-25 MG per tablet Take 1 tablet by mouth daily.      . simvastatin (ZOCOR) 20 MG tablet Take 20 mg by mouth every evening.      . tiotropium (SPIRIVA HANDIHALER) 18 MCG inhalation capsule Place 1 capsule (18 mcg total) into inhaler and inhale daily.  30 capsule  12  . warfarin (COUMADIN) 5 MG tablet Take 5 mg by mouth daily.       Scheduled: . amLODipine  10 mg Oral Daily  . antiseptic oral rinse  15 mL Mouth Rinse BID  . cefTRIAXone (ROCEPHIN)  IV  1 g Intravenous Q24H  . hydrALAZINE  50 mg Oral BID  . lisinopril  20 mg Oral Daily   And  . hydrochlorothiazide  25  mg Oral Daily  . labetalol  300 mg Oral BID  . predniSONE  50 mg Oral Q breakfast  . simvastatin  20 mg Oral QPM  . sodium chloride  3 mL Intravenous Q12H  . tiotropium  18 mcg Inhalation Daily   Continuous:  ZOX:WRUEAV chloride,  acetaminophen, acetaminophen, alum & mag hydroxide-simeth, HYDROcodone-acetaminophen, HYDROmorphone (DILAUDID) injection, ondansetron (ZOFRAN) IV, ondansetron, sodium chloride, traZODone  Assesment: She was admitted with coagulopathy from warfarin. She has COPD. She seems a little worse. She has hypertension and that seems pretty well controlled. She had a severe nosebleed Active Problems:   * No active hospital problems. *    Plan: I think she's okay to transfer from the step down. She needs treatment for her COPD. She will continue all of her other medications.    LOS: 1 day   Mirl Hillery L 02/27/2013, 8:42 AM

## 2013-02-27 NOTE — Plan of Care (Signed)
Problem: Consults Goal: Respiratory Problems Patient Education See Patient Education Module for education specifics. Outcome: Progressing SOB, dyspnea continues  Problem: ICU Phase Progression Outcomes Goal: O2 sats trending toward baseline Outcome: Progressing Patient remains on 2 liters of oxygen Goal: Dyspnea controlled at rest Outcome: Not Progressing Dyspnea continues Goal: Pain controlled with appropriate interventions Outcome: Completed/Met Date Met:  02/27/13 No complaints of pain Goal: Initial discharge plan identified Outcome: Progressing To home

## 2013-02-28 LAB — CBC
HCT: 30.8 % — ABNORMAL LOW (ref 36.0–46.0)
Hemoglobin: 9.9 g/dL — ABNORMAL LOW (ref 12.0–15.0)
MCHC: 32.1 g/dL (ref 30.0–36.0)
MCV: 87.7 fL (ref 78.0–100.0)
WBC: 12 10*3/uL — ABNORMAL HIGH (ref 4.0–10.5)

## 2013-02-28 LAB — PROTIME-INR: INR: 3.05 — ABNORMAL HIGH (ref 0.00–1.49)

## 2013-02-28 MED ORDER — METHYLPREDNISOLONE 4 MG PO KIT: PACK | ORAL | Status: DC

## 2013-02-28 MED ORDER — CEPHALEXIN 500 MG PO CAPS: 500.0000 mg | ORAL_CAPSULE | Freq: Three times a day (TID) | ORAL | Status: DC

## 2013-02-28 NOTE — Progress Notes (Signed)
Subjective: She says she feels well and wants to go home. She has no Complaints Objective: Vital signs in last 24 hours: Temp:  [97.5 F (36.4 C)-97.6 F (36.4 C)] 97.6 F (36.4 C) (04/23 0617) Pulse Rate:  [71-83] 71 (04/23 0617) Resp:  [18-20] 20 (04/23 0617) BP: (117-160)/(66-79) 160/74 mmHg (04/23 0617) SpO2:  [96 %-100 %] 96 % (04/23 0617) Weight:  [100.1 kg (220 lb 10.9 oz)] 100.1 kg (220 lb 10.9 oz) (04/23 0617) Weight change: 14.824 kg (32 lb 10.9 oz) Last BM Date: 02/27/13  Intake/Output from previous day: 04/22 0701 - 04/23 0700 In: 290 [P.O.:240; IV Piggyback:50] Out: -   PHYSICAL EXAM General appearance: alert, cooperative and no distress Resp: clear to auscultation bilaterally Cardio: irregularly irregular rhythm GI: soft, non-tender; bowel sounds normal; no masses,  no organomegaly Extremities: extremities normal, atraumatic, no cyanosis or edema  Lab Results:    Basic Metabolic Panel:  Recent Labs  16/10/96 0806  NA 141  K 3.8  CL 104  CO2 28  GLUCOSE 127*  BUN 51*  CREATININE 1.59*  CALCIUM 9.5   Liver Function Tests: No results found for this basename: AST, ALT, ALKPHOS, BILITOT, PROT, ALBUMIN,  in the last 72 hours No results found for this basename: LIPASE, AMYLASE,  in the last 72 hours No results found for this basename: AMMONIA,  in the last 72 hours CBC:  Recent Labs  02/26/13 0806  02/27/13 0427 02/27/13 1608 02/28/13 0507  WBC 13.1*  --  11.6*  --  12.0*  NEUTROABS 10.3*  --   --   --   --   HGB 10.5*  < > 9.3* 10.1* 9.9*  HCT 32.1*  < > 29.2* 30.8* 30.8*  MCV 86.3  --  87.7  --  87.7  PLT 308  --  242  --  259  < > = values in this interval not displayed. Cardiac Enzymes: No results found for this basename: CKTOTAL, CKMB, CKMBINDEX, TROPONINI,  in the last 72 hours BNP:  Recent Labs  02/26/13 0806  PROBNP 2575.0*   D-Dimer: No results found for this basename: DDIMER,  in the last 72 hours CBG: No results found for  this basename: GLUCAP,  in the last 72 hours Hemoglobin A1C: No results found for this basename: HGBA1C,  in the last 72 hours Fasting Lipid Panel: No results found for this basename: CHOL, HDL, LDLCALC, TRIG, CHOLHDL, LDLDIRECT,  in the last 72 hours Thyroid Function Tests: No results found for this basename: TSH, T4TOTAL, FREET4, T3FREE, THYROIDAB,  in the last 72 hours Anemia Panel: No results found for this basename: VITAMINB12, FOLATE, FERRITIN, TIBC, IRON, RETICCTPCT,  in the last 72 hours Coagulation:  Recent Labs  02/27/13 0427 02/28/13 0507  LABPROT 36.1* 29.9*  INR 3.93* 3.05*   Urine Drug Screen: Drugs of Abuse     Component Value Date/Time   LABOPIA NONE DETECTED 01/10/2013 1154   COCAINSCRNUR NONE DETECTED 01/10/2013 1154   LABBENZ NONE DETECTED 01/10/2013 1154   AMPHETMU NONE DETECTED 01/10/2013 1154   THCU NONE DETECTED 01/10/2013 1154   LABBARB NONE DETECTED 01/10/2013 1154    Alcohol Level: No results found for this basename: ETH,  in the last 72 hours Urinalysis: No results found for this basename: COLORURINE, APPERANCEUR, LABSPEC, PHURINE, GLUCOSEU, HGBUR, BILIRUBINUR, KETONESUR, PROTEINUR, UROBILINOGEN, NITRITE, LEUKOCYTESUR,  in the last 72 hours Misc. Labs:  ABGS No results found for this basename: PHART, PCO2, PO2ART, TCO2, HCO3,  in the last 72  hours CULTURES Recent Results (from the past 240 hour(s))  MRSA PCR SCREENING     Status: None   Collection Time    02/26/13  5:21 PM      Result Value Range Status   MRSA by PCR NEGATIVE  NEGATIVE Final   Comment:            The GeneXpert MRSA Assay (FDA     approved for NASAL specimens     only), is one component of a     comprehensive MRSA colonization     surveillance program. It is not     intended to diagnose MRSA     infection nor to guide or     monitor treatment for     MRSA infections.   Studies/Results: No results found.  Medications:  Prior to Admission:  Prescriptions prior to admission   Medication Sig Dispense Refill  . albuterol (PROVENTIL HFA;VENTOLIN HFA) 108 (90 BASE) MCG/ACT inhaler Inhale 2 puffs into the lungs every 6 (six) hours as needed for wheezing.  1 Inhaler  2  . amLODipine (NORVASC) 10 MG tablet Take 10 mg by mouth daily.      Marland Kitchen aspirin EC 81 MG tablet Take 81 mg by mouth daily.      Marland Kitchen azithromycin (ZITHROMAX) 250 MG tablet Take 250 mg by mouth daily. Take 2 tablets on day 1, then take 1 tablet daily for days 2-5.      . hydrALAZINE (APRESOLINE) 50 MG tablet Take 50 mg by mouth 2 (two) times daily.      Marland Kitchen labetalol (NORMODYNE) 300 MG tablet Take 300 mg by mouth 2 (two) times daily.      . predniSONE (STERAPRED UNI-PAK) 10 MG tablet Take by mouth daily. 4 tab po daily for 3 days, 3 tab po daily for 3 days, 2 tab po daily for 3 days, 1 tab po daily for 3 days  30 tablet  0  . quinapril-hydrochlorothiazide (ACCURETIC) 20-25 MG per tablet Take 1 tablet by mouth daily.      . simvastatin (ZOCOR) 20 MG tablet Take 20 mg by mouth every evening.      . tiotropium (SPIRIVA HANDIHALER) 18 MCG inhalation capsule Place 1 capsule (18 mcg total) into inhaler and inhale daily.  30 capsule  12  . warfarin (COUMADIN) 5 MG tablet Take 5 mg by mouth daily.       Scheduled: . amLODipine  10 mg Oral Daily  . antiseptic oral rinse  15 mL Mouth Rinse BID  . cefTRIAXone (ROCEPHIN)  IV  1 g Intravenous Q24H  . hydrALAZINE  50 mg Oral BID  . lisinopril  20 mg Oral Daily   And  . hydrochlorothiazide  25 mg Oral Daily  . labetalol  300 mg Oral BID  . predniSONE  50 mg Oral Q breakfast  . simvastatin  20 mg Oral QPM  . sodium chloride  3 mL Intravenous Q12H  . tiotropium  18 mcg Inhalation Daily  . Warfarin - Pharmacist Dosing Inpatient   Does not apply Q24H   Continuous:  VWU:[id number] chloride, acetaminophen, acetaminophen, alum & mag hydroxide-simeth, HYDROcodone-acetaminophen, HYDROmorphone (DILAUDID) injection, ondansetron (ZOFRAN) IV, ondansetron, sodium chloride,  traZODone  Assesment: She was admitted with warfarin-induced coagulopathy perhaps because she was placed on other medications. She has improved markedly. She had some trouble with COPD exacerbation but that is better. She wants to go home. She does have chronic atrial fibrillation. Active Problems:   Atrial fibrillation  CVA (cerebral infarction)   Pure hypercholesterolemia   Aortic sclerosis   Long term (current) use of anticoagulants   COPD exacerbation   Warfarin-induced coagulopathy   Epistaxis    Plan: She will be discharged home    LOS: 2 days   Saige Busby L 02/28/2013, 8:56 AM

## 2013-02-28 NOTE — Discharge Summary (Signed)
Physician Discharge Summary  Patient ID: [name] MRN: [id number] DOB/AGE: Sep 04, 1931 79 y.o. Primary Care Physician:FANTA,[name], MD Admit date: 02/26/2013 Discharge date: 02/28/2013    Discharge Diagnoses:   Active Problems:   Atrial fibrillation   CVA (cerebral infarction)   Pure hypercholesterolemia   Aortic sclerosis   Long term (current) use of anticoagulants   COPD exacerbation   Warfarin-induced coagulopathy   Epistaxis     Medication List    ASK your doctor about these medications       albuterol 108 (90 BASE) MCG/ACT inhaler  Commonly known as:  PROVENTIL HFA;VENTOLIN HFA  Inhale 2 puffs into the lungs every 6 (six) hours as needed for wheezing.     amLODipine 10 MG tablet  Commonly known as:  NORVASC  Take 10 mg by mouth daily.     aspirin EC 81 MG tablet  Take 81 mg by mouth daily.     azithromycin 250 MG tablet  Commonly known as:  ZITHROMAX  Take 250 mg by mouth daily. Take 2 tablets on day 1, then take 1 tablet daily for days 2-5.     hydrALAZINE 50 MG tablet  Commonly known as:  APRESOLINE  Take 50 mg by mouth 2 (two) times daily.     labetalol 300 MG tablet  Commonly known as:  NORMODYNE  Take 300 mg by mouth 2 (two) times daily.     predniSONE 10 MG tablet  Commonly known as:  STERAPRED UNI-PAK  Take by mouth daily. 4 tab po daily for 3 days, 3 tab po daily for 3 days, 2 tab po daily for 3 days, 1 tab po daily for 3 days     quinapril-hydrochlorothiazide 20-25 MG per tablet  Commonly known as:  ACCURETIC  Take 1 tablet by mouth daily.     simvastatin 20 MG tablet  Commonly known as:  ZOCOR  Take 20 mg by mouth every evening.     tiotropium 18 MCG inhalation capsule  Commonly known as:  SPIRIVA HANDIHALER  Place 1 capsule (18 mcg total) into inhaler and inhale daily.     warfarin 5 MG tablet  Commonly known as:  COUMADIN  Take 5 mg by mouth daily.        Discharged Condition: Improved    Consults:  None  Significant Diagnostic Studies: Dg Chest 2 View  02/26/2013  *RADIOLOGY REPORT*  Clinical Data: Shortness of breath  CHEST - 2 VIEW  Comparison: 01/11/2013  Findings: The cardiac shadow remains mildly enlarged.  The lungs are well aerated without focal infiltrate.  Mild central vascular congestion is seen.  No significant edema is noted.  IMPRESSION: Increase in central pulmonary vascular congestion.  No focal infiltrate is seen.   Original Report Authenticated By: Alcide Clever, M.D.     Lab Results: Basic Metabolic Panel:  Recent Labs  82/95/62 0806  NA 141  K 3.8  CL 104  CO2 28  GLUCOSE 127*  BUN 51*  CREATININE 1.59*  CALCIUM 9.5   Liver Function Tests: No results found for this basename: AST, ALT, ALKPHOS, BILITOT, PROT, ALBUMIN,  in the last 72 hours   CBC:  Recent Labs  02/26/13 0806  02/27/13 0427 02/27/13 1608 02/28/13 0507  WBC 13.1*  --  11.6*  --  12.0*  NEUTROABS 10.3*  --   --   --   --   HGB 10.5*  < > 9.3* 10.1* 9.9*  HCT 32.1*  < > 29.2* 30.8* 30.8*  MCV 86.3  --  87.7  --  87.7  PLT 308  --  242  --  259  < > = values in this interval not displayed.  Recent Results (from the past 240 hour(s))  MRSA PCR SCREENING     Status: None   Collection Time    02/26/13  5:21 PM      Result Value Range Status   MRSA by PCR NEGATIVE  NEGATIVE Final   Comment:            The GeneXpert MRSA Assay (FDA     approved for NASAL specimens     only), is one component of a     comprehensive MRSA colonization     surveillance program. It is not     intended to diagnose MRSA     infection nor to guide or     monitor treatment for     MRSA infections.     Hospital Course: She was admitted because of elevated PT/INR. She had a nosebleed and when she checked her pro time it was with INR greater than 8. She was evaluated in the emergency room and because of the fact that she had been bleeding had somewhat elevated BUN creatinine and somewhat lower hemoglobin  level she was brought in to the step down unit for close observation. Her hemoglobin level has been stable. She received vitamin K. She had trouble with a COPD exacerbation which is requiring treatment. On the day of discharge her INR is 3.05.  Discharge Exam: Blood pressure 160/74, pulse 71, temperature 97.6 F (36.4 C), temperature source Axillary, resp. rate 20, height 5\' 7"  (1.702 m), weight 100.1 kg (220 lb 10.9 oz), SpO2 96.00%. She is awake and alert and looks comfortable. Her chest is clear. Her heart is irregular. She looks much more comfortable. Hemoglobin level is 9.9 and INR as mentioned is 3.05  Disposition: Discharge home. She will not be on Coumadin. She will have PT/INR by her Coumadin clinic which is at [facility] either tomorrow or the next day. She does not want home health services      Signed: Fredirick Maudlin Pager [phone]  02/28/2013, 8:58 AM

## 2013-02-28 NOTE — Progress Notes (Signed)
Discharge instructions given to patient's daughter, verbalized understanding, out in stable condition via w/c with staff.

## 2013-02-28 NOTE — Progress Notes (Signed)
ANTICOAGULATION CONSULT NOTE  Pharmacy Consult for Warfarin (chronic PTA) Indication: afib and h/o DVT  No Known Allergies  Patient Measurements: Height: 5\' 7"  (170.2 cm) Weight: 220 lb 10.9 oz (100.1 kg) IBW/kg (Calculated) : 61.6  Vital Signs: Temp: 97.6 F (36.4 C) (04/23 0617) BP: 160/74 mmHg (04/23 0617) Pulse Rate: 71 (04/23 0617)  Labs:  Recent Labs  02/26/13 0806  02/27/13 0427 02/27/13 1608 02/28/13 0507  HGB 10.5*  < > 9.3* 10.1* 9.9*  HCT 32.1*  < > 29.2* 30.8* 30.8*  PLT 308  --  242  --  259  LABPROT 63.6*  --  36.1*  --  29.9*  INR 8.44*  --  3.93*  --  3.05*  CREATININE 1.59*  --   --   --   --   < > = values in this interval not displayed.  Estimated Creatinine Clearance: 34.9 ml/min (by C-G formula based on Cr of 1.59).  Medical History: Past Medical History  Diagnosis Date  . Hypercholesteremia   . Coronary artery disease   . PVD (peripheral vascular disease)   . Hypertension   . DVT (deep venous thrombosis) 05/2008  . Bilateral iliac artery occlusion   . Aortic valve sclerosis   . Atrial fibrillation    Medications:  Prescriptions prior to admission  Medication Sig Dispense Refill  . albuterol (PROVENTIL HFA;VENTOLIN HFA) 108 (90 BASE) MCG/ACT inhaler Inhale 2 puffs into the lungs every 6 (six) hours as needed for wheezing.  1 Inhaler  2  . amLODipine (NORVASC) 10 MG tablet Take 10 mg by mouth daily.      Marland Kitchen aspirin EC 81 MG tablet Take 81 mg by mouth daily.      . hydrALAZINE (APRESOLINE) 50 MG tablet Take 50 mg by mouth 2 (two) times daily.      Marland Kitchen labetalol (NORMODYNE) 300 MG tablet Take 300 mg by mouth 2 (two) times daily.      . quinapril-hydrochlorothiazide (ACCURETIC) 20-25 MG per tablet Take 1 tablet by mouth daily.      . simvastatin (ZOCOR) 20 MG tablet Take 20 mg by mouth every evening.      . tiotropium (SPIRIVA HANDIHALER) 18 MCG inhalation capsule Place 1 capsule (18 mcg total) into inhaler and inhale daily.  30 capsule  12  .  warfarin (COUMADIN) 5 MG tablet Take 5 mg by mouth daily.      . [DISCONTINUED] azithromycin (ZITHROMAX) 250 MG tablet Take 250 mg by mouth daily. Take 2 tablets on day 1, then take 1 tablet daily for days 2-5.      . [DISCONTINUED] predniSONE (STERAPRED UNI-PAK) 10 MG tablet Take by mouth daily. 4 tab po daily for 3 days, 3 tab po daily for 3 days, 2 tab po daily for 3 days, 1 tab po daily for 3 days  30 tablet  0    Assessment: 79yo female who is maintained on chronic Coumadin 5mg  daily for h/o DVT and afib.  Pt presented to hospital c/o epistaxis with blood on her pillow when she woke up.  INR was > 8 on admission and pt was given Vitamin K 5mg  PO per MD.  INR remains >3. Discharge plans reviewed.    Goal of Therapy:  INR 2-3   Plan:  HOLD coumadin today INR as outpatient   Elson Clan 02/28/2013,3:31 PM

## 2013-02-28 NOTE — Care Management Note (Signed)
    Page 1 of 1   02/28/2013     1:25:33 PM   CARE MANAGEMENT NOTE 02/28/2013  Patient:  [name], [name]   Account Number:  [id number]  Date Initiated:  02/27/2013  Documentation initiated by:  Sharrie Rothman  Subjective/Objective Assessment:   Pt admitted from home with COPD and epistaxis. Pt lives alone but has 2 sons and 2 sisters who check on pt frequently. Pts daughters live in [place]. Pt is independent with ADL's. [facility].     Action/Plan:   No CM needs noted.   Anticipated DC Date:  02/28/2013   Anticipated DC Plan:  HOME/SELF CARE      DC Planning Services  CM consult      Choice offered to / List presented to:             Status of service:  Completed, signed off Medicare Important Message given?  NA - LOS <3 / Initial given by admissions (If response is "NO", the following Medicare IM given date fields will be blank) Date Medicare IM given:   Date Additional Medicare IM given:    Discharge Disposition:  HOME/SELF CARE  Per UR Regulation:    If discussed at Long Length of Stay Meetings, dates discussed:    Comments:  02/28/13 1323 Arlyss Queen, RN BSN CM Pt discharged home today. Pt to present to [employer] for PT/INR and results sent to [facility] by the lab. [facility] to fax order and contact information to lab today. No other CM needs noted.  02/27/13 1540 Arlyss Queen, RN BSN CM

## 2013-03-01 ENCOUNTER — Ambulatory Visit (HOSPITAL_COMMUNITY)
Admission: RE | Admit: 2013-03-01 | Discharge: 2013-03-01 | Disposition: A | Discharge status: Home / Self Care | Payer: Medicare Other | Source: Ambulatory Visit | Attending: Internal Medicine | Admitting: Internal Medicine

## 2013-03-01 ENCOUNTER — Other Ambulatory Visit (HOSPITAL_COMMUNITY): Payer: Self-pay | Admitting: Internal Medicine

## 2013-03-01 DIAGNOSIS — M25561 Pain in right knee: Secondary | ICD-10-CM

## 2013-03-01 DIAGNOSIS — M25569 Pain in unspecified knee: Secondary | ICD-10-CM | POA: Insufficient documentation

## 2013-03-01 NOTE — Progress Notes (Signed)
Venous Right Lower Ext. Completed. Negative for acute DVT. Chauncy Lean

## 2013-03-08 DIAGNOSIS — I639 Cerebral infarction, unspecified: Secondary | ICD-10-CM

## 2013-03-08 HISTORY — DX: Cerebral infarction, unspecified: I63.9

## 2013-03-09 ENCOUNTER — Emergency Department (HOSPITAL_COMMUNITY)
Admission: EM | Admit: 2013-03-09 | Discharge: 2013-03-09 | Disposition: A | Discharge status: Home / Self Care | Payer: Medicare Other | Attending: Emergency Medicine | Admitting: Emergency Medicine

## 2013-03-09 ENCOUNTER — Encounter (HOSPITAL_COMMUNITY): Payer: Self-pay | Admitting: *Deleted

## 2013-03-09 DIAGNOSIS — I951 Orthostatic hypotension: Secondary | ICD-10-CM

## 2013-03-09 DIAGNOSIS — IMO0002 Reserved for concepts with insufficient information to code with codable children: Secondary | ICD-10-CM | POA: Insufficient documentation

## 2013-03-09 DIAGNOSIS — Z79899 Other long term (current) drug therapy: Secondary | ICD-10-CM | POA: Insufficient documentation

## 2013-03-09 DIAGNOSIS — R5383 Other fatigue: Secondary | ICD-10-CM | POA: Insufficient documentation

## 2013-03-09 DIAGNOSIS — R296 Repeated falls: Secondary | ICD-10-CM | POA: Insufficient documentation

## 2013-03-09 DIAGNOSIS — F17200 Nicotine dependence, unspecified, uncomplicated: Secondary | ICD-10-CM | POA: Insufficient documentation

## 2013-03-09 DIAGNOSIS — I4891 Unspecified atrial fibrillation: Secondary | ICD-10-CM

## 2013-03-09 DIAGNOSIS — R531 Weakness: Secondary | ICD-10-CM

## 2013-03-09 DIAGNOSIS — Y939 Activity, unspecified: Secondary | ICD-10-CM | POA: Insufficient documentation

## 2013-03-09 DIAGNOSIS — Z8679 Personal history of other diseases of the circulatory system: Secondary | ICD-10-CM | POA: Insufficient documentation

## 2013-03-09 DIAGNOSIS — N289 Disorder of kidney and ureter, unspecified: Secondary | ICD-10-CM

## 2013-03-09 DIAGNOSIS — W19XXXA Unspecified fall, initial encounter: Secondary | ICD-10-CM

## 2013-03-09 DIAGNOSIS — I10 Essential (primary) hypertension: Secondary | ICD-10-CM | POA: Insufficient documentation

## 2013-03-09 DIAGNOSIS — R5381 Other malaise: Secondary | ICD-10-CM | POA: Insufficient documentation

## 2013-03-09 DIAGNOSIS — R0602 Shortness of breath: Secondary | ICD-10-CM | POA: Insufficient documentation

## 2013-03-09 DIAGNOSIS — Z7982 Long term (current) use of aspirin: Secondary | ICD-10-CM | POA: Insufficient documentation

## 2013-03-09 DIAGNOSIS — Z86718 Personal history of other venous thrombosis and embolism: Secondary | ICD-10-CM | POA: Insufficient documentation

## 2013-03-09 DIAGNOSIS — Z7901 Long term (current) use of anticoagulants: Secondary | ICD-10-CM | POA: Insufficient documentation

## 2013-03-09 DIAGNOSIS — I739 Peripheral vascular disease, unspecified: Secondary | ICD-10-CM | POA: Insufficient documentation

## 2013-03-09 DIAGNOSIS — Y9289 Other specified places as the place of occurrence of the external cause: Secondary | ICD-10-CM | POA: Insufficient documentation

## 2013-03-09 DIAGNOSIS — E7800 Pure hypercholesterolemia, unspecified: Secondary | ICD-10-CM | POA: Insufficient documentation

## 2013-03-09 DIAGNOSIS — I2510 Atherosclerotic heart disease of native coronary artery without angina pectoris: Secondary | ICD-10-CM | POA: Insufficient documentation

## 2013-03-09 LAB — APTT: aPTT: 32 seconds (ref 24–37)

## 2013-03-09 LAB — URINALYSIS, ROUTINE W REFLEX MICROSCOPIC
Glucose, UA: NEGATIVE mg/dL
Ketones, ur: NEGATIVE mg/dL
Leukocytes, UA: NEGATIVE
Protein, ur: NEGATIVE mg/dL

## 2013-03-09 LAB — CBC WITH DIFFERENTIAL/PLATELET
Eosinophils Absolute: 0 10*3/uL (ref 0.0–0.7)
Eosinophils Relative: 0 % (ref 0–5)
Hemoglobin: 11.3 g/dL — ABNORMAL LOW (ref 12.0–15.0)
Lymphs Abs: 1.3 10*3/uL (ref 0.7–4.0)
MCH: 29 pg (ref 26.0–34.0)
MCV: 89.2 fL (ref 78.0–100.0)
Monocytes Relative: 15 % — ABNORMAL HIGH (ref 3–12)
RBC: 3.89 MIL/uL (ref 3.87–5.11)

## 2013-03-09 LAB — COMPREHENSIVE METABOLIC PANEL
BUN: 42 mg/dL — ABNORMAL HIGH (ref 6–23)
Calcium: 9.1 mg/dL (ref 8.4–10.5)
Creatinine, Ser: 1.67 mg/dL — ABNORMAL HIGH (ref 0.50–1.10)
GFR calc Af Amer: 33 mL/min — ABNORMAL LOW (ref >90)
Glucose, Bld: 141 mg/dL — ABNORMAL HIGH (ref 70–99)
Total Protein: 6.3 g/dL (ref 6.0–8.3)

## 2013-03-09 LAB — PROTIME-INR: INR: 1.16 (ref 0.00–1.49)

## 2013-03-09 MED ORDER — SODIUM CHLORIDE 0.9 % IV SOLN
INTRAVENOUS | Status: DC
  Administered 2013-03-09: 15:00:00 via INTRAVENOUS

## 2013-03-09 NOTE — ED Provider Notes (Signed)
History     This chart was scribed for Ward Givens, MD, MD by Smitty Pluck, ED Scribe. The patient was seen in room APA06/APA06 and the patient's care was started at 2:07 PM.   CSN: [id number]  Arrival date & time 03/09/13  1357      Chief Complaint  Patient presents with  . Fall     The history is provided by the patient and medical records. No language interpreter was used.   [name] is a 79 y.o. female with hx of CAD, PVD, DVT, atrial fib and HTN who presents to the Emergency Department complaining of fall today due left leg weakness. Pt reports that she was walking out of [employer] store and the weakness in the left leg occurred. Patient states she feels diffusely weak but her left leg gave out today and she fell. She states she scrapped her left knee during the fall. She reports having generalized weakness. She states the weakness in the left leg has occurred in the past. She was discharged from the [facility] 01/04/13 (admitted for new onset atrial fibrillation  and 4/21 for orthostatic hypotension and near syncope). She has had SOB with exertion since being discharged.. Pt denies chest pain, dizziness, head injury, LOC, fever, chills, nausea, blood in bowel movements, hematuria, dysuria, vomiting, diarrhea, weakness, cough, and any other pain. She denies melena, hematuria, or dysuria. She denies diaphoresis, nausea or vomiting. She states she has had episodes of feeling dizzy and lightheaded but not today. She states she has not eaten today, which is usual for her, but her appetite is normal. She reports that she was seen in Dr. Hazle Coca office last week and had her coumadin changed to Plavix.?   She stopped smoking 6 weeks ago. Denies drinking alcohol.   PCP is Dr. Felecia Shelling Cardiologist is Dr. Allyson Sabal   Past Medical History  Diagnosis Date  . Hypercholesteremia   . Coronary artery disease   . PVD (peripheral vascular disease)   . Hypertension   . DVT (deep venous thrombosis)  05/2008  . Bilateral iliac artery occlusion   . Aortic valve sclerosis   . Atrial fibrillation     Past Surgical History  Procedure Laterality Date  . Iliac artery stent Left 05/2008    Dr. Allyson Sabal  . Iliac artery stent Right 2010    Dr. Allyson Sabal  . Brain tumor excision  1999    for hemangioma [facility])  . Cataract extraction Bilateral   . Lens replacement Left     Family History  Problem Relation Age of Onset  . Hypertension Mother   . Heart attack Father     History  Substance Use Topics  . Smoking status: Current Every Day Smoker -- 0.50 packs/day for 35 years    Types: Cigarettes  . Smokeless tobacco: Not on file  . Alcohol Use: No   lives at home Lives alone Quit smoking 6 weeks ago  OB History   Grav Para Term Preterm Abortions TAB SAB Ect Mult Living                  Review of Systems  Respiratory: Positive for shortness of breath.   Cardiovascular: Negative for chest pain.  Neurological: Positive for weakness.  All other systems reviewed and are negative.    Allergies  Review of patient's allergies indicates no known allergies.  Home Medications   Patient's Medications  New Prescriptions   No medications on file  Previous Medications  ALBUTEROL (PROVENTIL HFA;VENTOLIN HFA) 108 (90 BASE) MCG/ACT INHALER    Inhale 2 puffs into the lungs every 6 (six) hours as needed for wheezing.   AMLODIPINE (NORVASC) 10 MG TABLET    Take 10 mg by mouth every morning.    ASPIRIN EC 81 MG TABLET    Take 81 mg by mouth every morning.    CEPHALEXIN (KEFLEX) 500 MG CAPSULE    Take 1 capsule (500 mg total) by mouth 3 (three) times daily.   HYDRALAZINE (APRESOLINE) 50 MG TABLET    Take 50 mg by mouth 2 (two) times daily.   LABETALOL (NORMODYNE) 300 MG TABLET    Take 300 mg by mouth 2 (two) times daily.   METHYLPREDNISOLONE (MEDROL, PAK,) 4 MG TABLET    follow package directions   QUINAPRIL-HYDROCHLOROTHIAZIDE (ACCURETIC) 20-25 MG PER TABLET    Take 1 tablet by mouth every  morning.    SIMVASTATIN (ZOCOR) 20 MG TABLET    Take 20 mg by mouth every evening.   WARFARIN (COUMADIN) 5 MG TABLET    Take 5 mg by mouth every evening.   Modified Medications   Modified Medication Previous Medication   TIOTROPIUM (SPIRIVA) 18 MCG INHALATION CAPSULE tiotropium (SPIRIVA HANDIHALER) 18 MCG inhalation capsule      Place 18 mcg into inhaler and inhale every morning.    Place 1 capsule (18 mcg total) into inhaler and inhale daily.  Discontinued Medications   No medications on file    BP 141/63  Pulse 73  Temp(Src) 98.1 F (36.7 C)  Resp 22  Ht 5' 7.5" (1.715 m)  Wt 188 lb (85.276 kg)  BMI 28.99 kg/m2  SpO2 96%  Vital signs normal   Orthostatic VS show blood pressure went from 141/62 sitting to 112/56 and her pulse went from 71 lying to 73 standing.   Physical Exam  Nursing note and vitals reviewed. Constitutional: She is oriented to person, place, and time. She appears well-developed and well-nourished.  Non-toxic appearance. She does not appear ill. No distress.  HENT:  Head: Normocephalic and atraumatic.  Right Ear: External ear normal.  Left Ear: External ear normal.  Nose: Nose normal. No mucosal edema or rhinorrhea.  Mouth/Throat: Oropharynx is clear and moist and mucous membranes are normal. No dental abscesses or edematous.  Eyes: Conjunctivae and EOM are normal. Pupils are equal, round, and reactive to light.  Neck: Normal range of motion and full passive range of motion without pain. Neck supple.  Cardiovascular: Normal rate and normal heart sounds.  An irregularly irregular rhythm present. Exam reveals no gallop and no friction rub.   No murmur heard. Pulmonary/Chest: Effort normal and breath sounds normal. No respiratory distress. She has no wheezes. She has no rhonchi. She has no rales. She exhibits no tenderness and no crepitus.  Abdominal: Soft. Normal appearance and bowel sounds are normal. She exhibits no distension. There is no tenderness. There  is no rebound and no guarding.  Musculoskeletal: Normal range of motion. She exhibits no edema and no tenderness.  Moves all extremities well.  No swelling to the left knee, no joint effusion, no pain on ROM   Neurological: She is alert and oriented to person, place, and time. She has normal strength. No cranial nerve deficit.  Skin: Skin is warm, dry and intact. No rash noted. No erythema. No pallor.  0.5 cm superficial abrasion on proximal left knee  Psychiatric: She has a normal mood and affect. Her speech is normal and behavior is  normal. Her mood appears not anxious.    ED Course  Procedures (including critical care time) DIAGNOSTIC STUDIES: Oxygen Saturation is 96% on room air, adequate by my interpretation.   Medications  0.9 %  sodium chloride infusion (not administered)    COORDINATION OF CARE:  Medications  0.9 %  sodium chloride infusion (not administered)    2:16 PM Discussed ED treatment with pt and pt agrees.   Pt ate while in the ED and received IV fluids. On recheck she denies any pain. Her HR has been in the 70's on her monitor.   Pt ambulated and felt weak and dizzy, and her pulse ox was 95% on RA. Nurse verbally told me her pulse didn't rise.    Results for orders placed during the hospital encounter of 03/09/13  CBC WITH DIFFERENTIAL      Result Value Range   WBC 6.9  4.0 - 10.5 K/uL   RBC 3.89  3.87 - 5.11 MIL/uL   Hemoglobin 11.3 (*) 12.0 - 15.0 g/dL   HCT 09.8 (*) 11.9 - 14.7 %   MCV 89.2  78.0 - 100.0 fL   MCH 29.0  26.0 - 34.0 pg   MCHC 32.6  30.0 - 36.0 g/dL   RDW 82.9 (*) 56.2 - 13.0 %   Platelets 232  150 - 400 K/uL   Neutrophils Relative 65  43 - 77 %   Neutro Abs 4.5  1.7 - 7.7 K/uL   Lymphocytes Relative 19  12 - 46 %   Lymphs Abs 1.3  0.7 - 4.0 K/uL   Monocytes Relative 15 (*) 3 - 12 %   Monocytes Absolute 1.0  0.1 - 1.0 K/uL   Eosinophils Relative 0  0 - 5 %   Eosinophils Absolute 0.0  0.0 - 0.7 K/uL   Basophils Relative 1  0 - 1 %    Basophils Absolute 0.0  0.0 - 0.1 K/uL  COMPREHENSIVE METABOLIC PANEL      Result Value Range   Sodium 146 (*) 135 - 145 mEq/L   Potassium 3.6  3.5 - 5.1 mEq/L   Chloride 104  96 - 112 mEq/L   CO2 35 (*) 19 - 32 mEq/L   Glucose, Bld 141 (*) 70 - 99 mg/dL   BUN 42 (*) 6 - 23 mg/dL   Creatinine, Ser 8.65 (*) 0.50 - 1.10 mg/dL   Calcium 9.1  8.4 - 78.4 mg/dL   Total Protein 6.3  6.0 - 8.3 g/dL   Albumin 3.3 (*) 3.5 - 5.2 g/dL   AST 18  0 - 37 U/L   ALT 20  0 - 35 U/L   Alkaline Phosphatase 62  39 - 117 U/L   Total Bilirubin 0.7  0.3 - 1.2 mg/dL   GFR calc non Af Amer 28 (*) >90 mL/min   GFR calc Af Amer 33 (*) >90 mL/min  APTT      Result Value Range   aPTT 32  24 - 37 seconds  PROTIME-INR      Result Value Range   Prothrombin Time 14.6  11.6 - 15.2 seconds   INR 1.16  0.00 - 1.49  URINALYSIS, ROUTINE W REFLEX MICROSCOPIC      Result Value Range   Color, Urine YELLOW  YELLOW   APPearance CLEAR  CLEAR   Specific Gravity, Urine 1.015  1.005 - 1.030   pH 6.5  5.0 - 8.0   Glucose, UA NEGATIVE  NEGATIVE mg/dL   Hgb urine  dipstick NEGATIVE  NEGATIVE   Bilirubin Urine NEGATIVE  NEGATIVE   Ketones, ur NEGATIVE  NEGATIVE mg/dL   Protein, ur NEGATIVE  NEGATIVE mg/dL   Urobilinogen, UA 0.2  0.0 - 1.0 mg/dL   Nitrite NEGATIVE  NEGATIVE   Leukocytes, UA NEGATIVE  NEGATIVE   Laboratory interpretation all normal except stable renal insuffic, elevated CO2 and mildly elevated sodium c/w dehydration, mild stable anemia    Date: 03/09/2013  Rate: 69  Rhythm: atrial fibrillation  QRS Axis: normal  Intervals: normal  ST/T Wave abnormalities: nonspecific T wave changes  Conduction Disutrbances:LVH  Narrative Interpretation:   Old EKG Reviewed: unchanged from 02/26/2013     1. Weakness   2. Fall, initial encounter   3. Orthostasis   4. Atrial fibrillation   5. Renal insufficiency     Plan discharge  Devoria Albe, MD, FACEP    MDM   I personally performed the services  described in this documentation, which was scribed in my presence. The recorded information has been reviewed and considered.  Devoria Albe, MD, Armando Gang       Ward Givens, MD 03/09/13 [phone]

## 2013-03-09 NOTE — ED Notes (Signed)
C/o lightheadness while ambulating advised to drink fluids, change positions slowly and see pcp for adjustment of meds in the coming week, sat 95 percent while ambulating

## 2013-03-09 NOTE — ED Notes (Signed)
States she was in the store and began to feel weak, recently diagnosed with a-fib

## 2013-03-12 ENCOUNTER — Ambulatory Visit (HOSPITAL_COMMUNITY)
Admit: 2013-03-12 | Discharge: 2013-03-12 | Disposition: A | Discharge status: Home / Self Care | Payer: Medicare Other | Source: Ambulatory Visit | Attending: Internal Medicine | Admitting: Internal Medicine

## 2013-03-12 DIAGNOSIS — R0609 Other forms of dyspnea: Secondary | ICD-10-CM | POA: Insufficient documentation

## 2013-03-12 DIAGNOSIS — R0989 Other specified symptoms and signs involving the circulatory and respiratory systems: Secondary | ICD-10-CM | POA: Insufficient documentation

## 2013-03-12 LAB — BLOOD GAS, ARTERIAL
FIO2: 0.21 %
O2 Saturation: 93.6 %
Patient temperature: 37
pH, Arterial: 7.406 (ref 7.350–7.450)

## 2013-03-16 ENCOUNTER — Other Ambulatory Visit: Payer: Self-pay

## 2013-03-16 ENCOUNTER — Emergency Department (HOSPITAL_COMMUNITY): Payer: Medicare Other

## 2013-03-16 ENCOUNTER — Emergency Department (HOSPITAL_COMMUNITY)
Admission: EM | Admit: 2013-03-16 | Discharge: 2013-03-17 | Disposition: A | Discharge status: Home / Self Care | Payer: Medicare Other | Attending: Emergency Medicine | Admitting: Emergency Medicine

## 2013-03-16 DIAGNOSIS — R29810 Facial weakness: Secondary | ICD-10-CM | POA: Insufficient documentation

## 2013-03-16 DIAGNOSIS — I10 Essential (primary) hypertension: Secondary | ICD-10-CM | POA: Insufficient documentation

## 2013-03-16 DIAGNOSIS — Z7901 Long term (current) use of anticoagulants: Secondary | ICD-10-CM | POA: Insufficient documentation

## 2013-03-16 DIAGNOSIS — Z7902 Long term (current) use of antithrombotics/antiplatelets: Secondary | ICD-10-CM | POA: Insufficient documentation

## 2013-03-16 DIAGNOSIS — I4891 Unspecified atrial fibrillation: Secondary | ICD-10-CM | POA: Insufficient documentation

## 2013-03-16 DIAGNOSIS — R29898 Other symptoms and signs involving the musculoskeletal system: Secondary | ICD-10-CM

## 2013-03-16 DIAGNOSIS — Z79899 Other long term (current) drug therapy: Secondary | ICD-10-CM | POA: Insufficient documentation

## 2013-03-16 DIAGNOSIS — R4781 Slurred speech: Secondary | ICD-10-CM

## 2013-03-16 DIAGNOSIS — Z8679 Personal history of other diseases of the circulatory system: Secondary | ICD-10-CM | POA: Insufficient documentation

## 2013-03-16 DIAGNOSIS — F17200 Nicotine dependence, unspecified, uncomplicated: Secondary | ICD-10-CM | POA: Insufficient documentation

## 2013-03-16 DIAGNOSIS — R4789 Other speech disturbances: Secondary | ICD-10-CM | POA: Insufficient documentation

## 2013-03-16 DIAGNOSIS — M6281 Muscle weakness (generalized): Secondary | ICD-10-CM | POA: Insufficient documentation

## 2013-03-16 DIAGNOSIS — E7800 Pure hypercholesterolemia, unspecified: Secondary | ICD-10-CM | POA: Insufficient documentation

## 2013-03-16 DIAGNOSIS — I2510 Atherosclerotic heart disease of native coronary artery without angina pectoris: Secondary | ICD-10-CM | POA: Insufficient documentation

## 2013-03-16 DIAGNOSIS — Z7982 Long term (current) use of aspirin: Secondary | ICD-10-CM | POA: Insufficient documentation

## 2013-03-16 DIAGNOSIS — Z86718 Personal history of other venous thrombosis and embolism: Secondary | ICD-10-CM | POA: Insufficient documentation

## 2013-03-16 LAB — GLUCOSE, CAPILLARY: Glucose-Capillary: 172 mg/dL — ABNORMAL HIGH (ref 70–99)

## 2013-03-16 MED ORDER — SODIUM CHLORIDE 0.9 % IV BOLUS (SEPSIS)
500.0000 mL | Freq: Once | INTRAVENOUS | Status: AC
  Administered 2013-03-16: 500 mL via INTRAVENOUS

## 2013-03-16 NOTE — ED Notes (Signed)
Pt presents with slurred speech, states she believes it started around 1800 tonight however another family member called her "around 4-5" and states she noticed her speech was slurred then. Currently slurred speech is noted. Pt is AOx4 at this time. Slight right facial droop noted. Pt has passed swallow screen and was seen by dr strand, pt at CT at this time

## 2013-03-16 NOTE — ED Notes (Signed)
Rt facial droop and lt side weakness/  Alert, talking, talked to daughter and daughter noticed slurred speech app 6 p,

## 2013-03-17 ENCOUNTER — Encounter (HOSPITAL_COMMUNITY): Payer: Self-pay | Admitting: *Deleted

## 2013-03-17 ENCOUNTER — Emergency Department (HOSPITAL_COMMUNITY)
Admission: EM | Admit: 2013-03-17 | Discharge: 2013-03-17 | Disposition: A | Discharge status: Home / Self Care | Payer: Medicare Other | Attending: Emergency Medicine | Admitting: Emergency Medicine

## 2013-03-17 ENCOUNTER — Emergency Department (HOSPITAL_COMMUNITY): Payer: Medicare Other

## 2013-03-17 DIAGNOSIS — Z86718 Personal history of other venous thrombosis and embolism: Secondary | ICD-10-CM | POA: Insufficient documentation

## 2013-03-17 DIAGNOSIS — I2510 Atherosclerotic heart disease of native coronary artery without angina pectoris: Secondary | ICD-10-CM | POA: Insufficient documentation

## 2013-03-17 DIAGNOSIS — I739 Peripheral vascular disease, unspecified: Secondary | ICD-10-CM | POA: Insufficient documentation

## 2013-03-17 DIAGNOSIS — R42 Dizziness and giddiness: Secondary | ICD-10-CM | POA: Insufficient documentation

## 2013-03-17 DIAGNOSIS — F17200 Nicotine dependence, unspecified, uncomplicated: Secondary | ICD-10-CM | POA: Insufficient documentation

## 2013-03-17 DIAGNOSIS — I10 Essential (primary) hypertension: Secondary | ICD-10-CM | POA: Insufficient documentation

## 2013-03-17 DIAGNOSIS — Z79899 Other long term (current) drug therapy: Secondary | ICD-10-CM | POA: Insufficient documentation

## 2013-03-17 DIAGNOSIS — Z7982 Long term (current) use of aspirin: Secondary | ICD-10-CM | POA: Insufficient documentation

## 2013-03-17 DIAGNOSIS — R4789 Other speech disturbances: Secondary | ICD-10-CM | POA: Insufficient documentation

## 2013-03-17 DIAGNOSIS — E7800 Pure hypercholesterolemia, unspecified: Secondary | ICD-10-CM | POA: Insufficient documentation

## 2013-03-17 DIAGNOSIS — Z8679 Personal history of other diseases of the circulatory system: Secondary | ICD-10-CM | POA: Insufficient documentation

## 2013-03-17 DIAGNOSIS — R29818 Other symptoms and signs involving the nervous system: Secondary | ICD-10-CM | POA: Insufficient documentation

## 2013-03-17 DIAGNOSIS — Z7901 Long term (current) use of anticoagulants: Secondary | ICD-10-CM | POA: Insufficient documentation

## 2013-03-17 DIAGNOSIS — R2990 Unspecified symptoms and signs involving the nervous system: Secondary | ICD-10-CM

## 2013-03-17 LAB — COMPREHENSIVE METABOLIC PANEL
AST: 17 U/L (ref 0–37)
BUN: 47 mg/dL — ABNORMAL HIGH (ref 6–23)
CO2: 28 mEq/L (ref 19–32)
Calcium: 9 mg/dL (ref 8.4–10.5)
Chloride: 100 mEq/L (ref 96–112)
Creatinine, Ser: 1.79 mg/dL — ABNORMAL HIGH (ref 0.50–1.10)
GFR calc Af Amer: 30 mL/min — ABNORMAL LOW (ref >90)
GFR calc non Af Amer: 26 mL/min — ABNORMAL LOW (ref >90)
Total Bilirubin: 0.3 mg/dL (ref 0.3–1.2)

## 2013-03-17 LAB — URINALYSIS, ROUTINE W REFLEX MICROSCOPIC
Bilirubin Urine: NEGATIVE
Nitrite: NEGATIVE
Specific Gravity, Urine: 1.025 (ref 1.005–1.030)
Urobilinogen, UA: 0.2 mg/dL (ref 0.0–1.0)

## 2013-03-17 LAB — TROPONIN I: Troponin I: <0.3 ng/mL (ref <0.30)

## 2013-03-17 LAB — CBC
HCT: 34 % — ABNORMAL LOW (ref 36.0–46.0)
Hemoglobin: 11 g/dL — ABNORMAL LOW (ref 12.0–15.0)
MCHC: 32.4 g/dL (ref 30.0–36.0)
MCV: 88.3 fL (ref 78.0–100.0)
RDW: 15.8 % — ABNORMAL HIGH (ref 11.5–15.5)
WBC: 7.3 10*3/uL (ref 4.0–10.5)

## 2013-03-17 LAB — DIFFERENTIAL
Basophils Absolute: 0 10*3/uL (ref 0.0–0.1)
Eosinophils Relative: 0 % (ref 0–5)
Lymphocytes Relative: 10 % — ABNORMAL LOW (ref 12–46)
Monocytes Absolute: 0.3 10*3/uL (ref 0.1–1.0)
Monocytes Relative: 4 % (ref 3–12)

## 2013-03-17 MED ORDER — ALBUTEROL SULFATE (5 MG/ML) 0.5% IN NEBU
5.0000 mg | INHALATION_SOLUTION | Freq: Once | RESPIRATORY_TRACT | Status: AC
  Administered 2013-03-17: 5 mg via RESPIRATORY_TRACT
  Filled 2013-03-17: qty 1

## 2013-03-17 NOTE — ED Provider Notes (Addendum)
History  This chart was scribed for Gilda Crease, MD by Jiles Prows, ED Scribe. The patient was seen in room APA10/APA10 and the patient's care was started at 7:12 PM.  CSN: [id number]  Arrival date & time 03/17/13  1478   Chief Complaint  Patient presents with  . Weakness  . Dizziness    The history is provided by the patient, medical records and a relative. No language interpreter was used.   HPI Comments: [name] is a 79 y.o. female with a hx of PVD, DVT, HTN, and atrial fibrillation who presents to the Emergency Department complaining of a fall that occurred at about 12 pm today. Patient attributes fall to dizziness. Pt is also complaining of difficult ambulating due to left leg weakness that has persisted for one month. Pt's daughter reports that her speech has been slurred for the past 2 days, there is evident left sided facial droop today.  This is patient's 2nd fall in the past 10 days.  Pt denies numbness, tingling, headache, diaphoresis, fever, chills, nausea, vomiting, diarrhea, cough, SOB and any other pain.   Previous Chart Review: Pt was seen after her initial fall last week (03/09/13). Her discharge diagnoses were weakness and orthostasis. She was seen again yesterday (03/16/2013) for the slurred speech and facial droop. Head CT was negative for acute injury and labs were normal. Patient's speech improved in the ED, but facial droop was still visible. Patient was encouraged to use a cane or walker for ambulation in light of recent fall. Patient was discharged home after evaluation in stable condition.   Past Medical History  Diagnosis Date  . Hypercholesteremia   . Coronary artery disease   . PVD (peripheral vascular disease)   . Hypertension   . DVT (deep venous thrombosis) 05/2008  . Bilateral iliac artery occlusion   . Aortic valve sclerosis   . Atrial fibrillation     Past Surgical History  Procedure Laterality Date  . Iliac artery stent Left  05/2008    Dr. Allyson Sabal  . Iliac artery stent Right 2010    Dr. Allyson Sabal  . Brain tumor excision  1999    for hemangioma [facility])  . Cataract extraction Bilateral   . Lens replacement Left     Family History  Problem Relation Age of Onset  . Hypertension Mother   . Heart attack Father     History  Substance Use Topics  . Smoking status: Current Every Day Smoker -- 0.50 packs/day for 35 years    Types: Cigarettes  . Smokeless tobacco: Not on file  . Alcohol Use: No    OB History   Grav Para Term Preterm Abortions TAB SAB Ect Mult Living                  Review of Systems  Constitutional: Negative for fever.  HENT: Negative for ear pain.   Respiratory: Negative for cough and choking.   Cardiovascular: Negative for chest pain.  Neurological: Positive for dizziness, speech difficulty and weakness.  All other systems reviewed and are negative.   Allergies  Review of patient's allergies indicates no known allergies.  Home Medications   Current Outpatient Rx  Name  Route  Sig  Dispense  Refill  . albuterol (PROVENTIL HFA;VENTOLIN HFA) 108 (90 BASE) MCG/ACT inhaler   Inhalation   Inhale 2 puffs into the lungs every 6 (six) hours as needed for wheezing.   1 Inhaler   2   . amLODipine (NORVASC)  10 MG tablet   Oral   Take 10 mg by mouth every morning.          Marland Kitchen apixaban (ELIQUIS) 5 MG TABS tablet   Oral   Take 5 mg by mouth 2 (two) times daily.         Marland Kitchen aspirin EC 81 MG tablet   Oral   Take 81 mg by mouth every morning.          . furosemide (LASIX) 20 MG tablet   Oral   Take 20 mg by mouth daily.         . hydrALAZINE (APRESOLINE) 50 MG tablet   Oral   Take 50 mg by mouth 2 (two) times daily.         Marland Kitchen labetalol (NORMODYNE) 300 MG tablet   Oral   Take 300 mg by mouth 2 (two) times daily.         . quinapril-hydrochlorothiazide (ACCURETIC) 20-25 MG per tablet   Oral   Take 1 tablet by mouth every morning.          . simvastatin (ZOCOR) 20  MG tablet   Oral   Take 20 mg by mouth every evening.         . tiotropium (SPIRIVA) 18 MCG inhalation capsule   Inhalation   Place 18 mcg into inhaler and inhale every morning.         . warfarin (COUMADIN) 5 MG tablet   Oral   Take 5 mg by mouth every evening.            BP 116/64  Pulse 77  Temp(Src) 98.4 F (36.9 C) (Oral)  Resp 18  SpO2 98%  Physical Exam  Constitutional: She is oriented to person, place, and time. She appears well-developed and well-nourished. No distress.  HENT:  Head: Normocephalic and atraumatic.  Right Ear: Hearing normal.  Left Ear: Hearing normal.  Nose: Nose normal.  Mouth/Throat: Oropharynx is clear and moist and mucous membranes are normal.  Eyes: Conjunctivae and EOM are normal. Pupils are equal, round, and reactive to light.  Neck: Normal range of motion. Neck supple.  Cardiovascular: Regular rhythm, S1 normal and S2 normal.  Exam reveals no gallop and no friction rub.   No murmur heard. Pulmonary/Chest: Effort normal and breath sounds normal. No respiratory distress. She exhibits no tenderness.  Abdominal: Soft. Normal appearance and bowel sounds are normal. There is no hepatosplenomegaly. There is no tenderness. There is no rebound, no guarding, no tenderness at McBurney's point and negative Murphy's sign. No hernia.  Musculoskeletal: Normal range of motion.  Neurological: She is alert and oriented to person, place, and time. She has normal strength. No cranial nerve deficit or sensory deficit. Coordination normal. GCS eye subscore is 4. GCS verbal subscore is 5. GCS motor subscore is 6.  Skin: Skin is warm, dry and intact. No rash noted. No cyanosis.  Psychiatric: She has a normal mood and affect. Her speech is normal and behavior is normal. Thought content normal.    ED Course  Procedures (including critical care time) DIAGNOSTIC STUDIES: Oxygen Saturation is 98% on RA, normal by my interpretation.    COORDINATION OF  CARE: 7:18 PM - Discussed ED treatment with pt at bedside including labs and possible admission and pt agrees.   Labs Reviewed - No data to display Ct Head (brain) Wo Contrast  03/16/2013  *RADIOLOGY REPORT*  Clinical Data: Right facial droop.  Left side weakness.  CT HEAD WITHOUT  CONTRAST  Technique:  Contiguous axial images were obtained from the base of the skull through the vertex without contrast.  Comparison: Brain MRI head CT scan 01/10/2013.  Findings: Postoperative change of frontal craniotomy with encephalomalacia in the frontal lobes bilaterally, worse on the left, appears unchanged.  Remote bilateral basal ganglia lacunar infarctions are identified.  No evidence of acute abnormality including infarction, hemorrhage, mass lesion, mass effect, midline shift or abnormal extra-axial fluid collection is seen.  There is no hydrocephalus with ex vacuo dilatation of the frontal horns of the lateral ventricles noted.  No pneumocephalus is identified. Air fluid levels are seen in the maxillary sinuses bilaterally. There is mucosal thickening in the sphenoid sinuses.  IMPRESSION:  1.  No acute intracranial abnormality. 2.  Air-fluid levels in the maxillary sinuses consistent with acute sinusitis.  Mucosal thickening sphenoid sinus is also noted. 3.  Status post frontal craniotomy with underlying encephalomalacia.  Chronic microvascular ischemic change also noted.   Original Report Authenticated By: Holley Dexter, M.D.     Diagnosis: 1. Weakness with falls 2. Possible subacute CVA   MDM  Patient brought to the ER by daughter after a fall. Patient was seen in ER yesterday for facial droop, slurred speech and generalized weakness. Workup at that time was unremarkable the patient was discharged. Daughter reports that she has noticed the slurred speech for at least the last 3 days. Patient fell today and did hit her head. Daughter was concerned because she is on blood thinners. Head CT was repeated today  and was unremarkable and unchanged.  My examination shows slight right facial droop. Patient also complaining of a subjective decreased sensation in the left arm and left leg compared to the right. No strength deficit noted. This could possibly be a subacute CVA. Symptoms have been present for at least several days. There is no way to obtain an MRI weekend and patient does not warrant transfer for MRI because she is not a candidate for any interventions. This was discussed with the daughter. I recommended hospitalization for further management including ultimately getting MRI in determining patient's needs if there is a CVA, such as physical therapy. Daughter reports that she has home health nursing and physical therapy coming in the home. Patient does not wish to be admitted and the daughter does not want her admitted. Patient has followup Monday morning at 9 AM her primary care doctor's office, Dr. Felecia Shelling. She will therefore be discharged. Daughter is visiting from [place] and will be with her continuously until she is seen by Dr. Felecia Shelling. Patient is to return to the ER if any of her symptoms worsen.  Patient already on Eliquis and aspirin, no further pharmacologic therapy necessary.  I personally performed the services described in this documentation, which was scribed in my presence. The recorded information has been reviewed and is accurate.   Gilda Crease, MD 03/17/13 2110  Gilda Crease, MD 03/17/13 2144

## 2013-03-17 NOTE — ED Provider Notes (Signed)
History     CSN: [id number]  Arrival date & time 03/16/13  2210   First MD Initiated Contact with Patient 03/16/13 2313      No chief complaint on file.   (Consider location/radiation/quality/duration/timing/severity/associated sxs/prior treatment) HPI [name] is a 79 y.o. female with hx of CAD, PVD, DVT, atrial fib, PVD  and HTN who presents to the Emergency Department complaining of right sided facial droop, slurred speech and left leg weakness when she woke up from a nap at 1800. She has had left leg weakness in the past . She was seen here on 03/09/13 for left leg weakness due to a fall.She has had her coumadin changed to plavix by Dr. Allyson Sabal recently. She denies fever, chills, chest pain, dizziness, fever, chills, nausea, vomiting.   PCP Dr. Felecia Shelling Cardiology Dr. Allyson Sabal   Past Medical History  Diagnosis Date  . Hypercholesteremia   . Coronary artery disease   . PVD (peripheral vascular disease)   . Hypertension   . DVT (deep venous thrombosis) 05/2008  . Bilateral iliac artery occlusion   . Aortic valve sclerosis   . Atrial fibrillation     Past Surgical History  Procedure Laterality Date  . Iliac artery stent Left 05/2008    Dr. Allyson Sabal  . Iliac artery stent Right 2010    Dr. Allyson Sabal  . Brain tumor excision  1999    for hemangioma [facility])  . Cataract extraction Bilateral   . Lens replacement Left     Family History  Problem Relation Age of Onset  . Hypertension Mother   . Heart attack Father     History  Substance Use Topics  . Smoking status: Current Every Day Smoker -- 0.50 packs/day for 35 years    Types: Cigarettes  . Smokeless tobacco: Not on file  . Alcohol Use: No    OB History   Grav Para Term Preterm Abortions TAB SAB Ect Mult Living                  Review of Systems  Constitutional: Negative for fever.       10 Systems reviewed and are negative for acute change except as noted in the HPI.  HENT: Negative for congestion.         Right facial droop  Eyes: Negative for discharge and redness.  Respiratory: Negative for cough and shortness of breath.   Cardiovascular: Negative for chest pain.  Gastrointestinal: Negative for vomiting and abdominal pain.  Musculoskeletal: Negative for back pain.  Skin: Negative for rash.  Neurological: Negative for syncope, numbness and headaches.  Psychiatric/Behavioral:       No behavior change.    Allergies  Review of patient's allergies indicates no known allergies.  Home Medications   Current Outpatient Rx  Name  Route  Sig  Dispense  Refill  . amLODipine (NORVASC) 10 MG tablet   Oral   Take 10 mg by mouth every morning.          Marland Kitchen apixaban (ELIQUIS) 5 MG TABS tablet   Oral   Take 5 mg by mouth 2 (two) times daily.         Marland Kitchen aspirin EC 81 MG tablet   Oral   Take 81 mg by mouth every morning.          . furosemide (LASIX) 20 MG tablet   Oral   Take 20 mg by mouth daily.         . hydrALAZINE (APRESOLINE)  50 MG tablet   Oral   Take 50 mg by mouth 2 (two) times daily.         Marland Kitchen labetalol (NORMODYNE) 300 MG tablet   Oral   Take 300 mg by mouth 2 (two) times daily.         . quinapril-hydrochlorothiazide (ACCURETIC) 20-25 MG per tablet   Oral   Take 1 tablet by mouth every morning.          . simvastatin (ZOCOR) 20 MG tablet   Oral   Take 20 mg by mouth every evening.         Marland Kitchen albuterol (PROVENTIL HFA;VENTOLIN HFA) 108 (90 BASE) MCG/ACT inhaler   Inhalation   Inhale 2 puffs into the lungs every 6 (six) hours as needed for wheezing.   1 Inhaler   2   . tiotropium (SPIRIVA) 18 MCG inhalation capsule   Inhalation   Place 18 mcg into inhaler and inhale every morning.         . warfarin (COUMADIN) 5 MG tablet   Oral   Take 5 mg by mouth every evening.            BP 153/59  Pulse 73  Temp(Src) 98 F (36.7 C) (Oral)  Resp 20  Ht 5\' 7"  (1.702 m)  Wt 188 lb (85.276 kg)  BMI 29.44 kg/m2  SpO2 100%  Physical Exam  Nursing note  and vitals reviewed. Constitutional: She appears well-developed and well-nourished.  Awake, alert, nontoxic appearance.  HENT:  Head: Normocephalic and atraumatic.  Mild right sided facial droop  Eyes: EOM are normal. Pupils are equal, round, and reactive to light.  Neck: Neck supple.  Cardiovascular: Intact distal pulses.   Irregular rhythm  Pulmonary/Chest: Effort normal and breath sounds normal. She exhibits no tenderness.  Abdominal: Soft. Bowel sounds are normal. There is no tenderness. There is no rebound.  Musculoskeletal: She exhibits no tenderness.  Baseline ROM, no obvious new focal weakness.  Neurological:  Mental status and motor strength appears baseline for patient and situation. Patient is right handed. Normal movement in all four extremities. Right facial droop and slurred speech.  Skin: No rash noted.  Psychiatric: She has a normal mood and affect.    ED Course  Procedures (including critical care time) Results for orders placed during the hospital encounter of 03/16/13  PROTIME-INR      Result Value Range   Prothrombin Time 14.8  11.6 - 15.2 seconds   INR 1.18  0.00 - 1.49  APTT      Result Value Range   aPTT 29  24 - 37 seconds  CBC      Result Value Range   WBC 7.3  4.0 - 10.5 K/uL   RBC 3.85 (*) 3.87 - 5.11 MIL/uL   Hemoglobin 11.0 (*) 12.0 - 15.0 g/dL   HCT 16.1 (*) 09.6 - 04.5 %   MCV 88.3  78.0 - 100.0 fL   MCH 28.6  26.0 - 34.0 pg   MCHC 32.4  30.0 - 36.0 g/dL   RDW 40.9 (*) 81.1 - 91.4 %   Platelets 210  150 - 400 K/uL  DIFFERENTIAL      Result Value Range   Neutrophils Relative 86 (*) 43 - 77 %   Neutro Abs 6.3  1.7 - 7.7 K/uL   Lymphocytes Relative 10 (*) 12 - 46 %   Lymphs Abs 0.8  0.7 - 4.0 K/uL   Monocytes Relative 4  3 - 12 %  Monocytes Absolute 0.3  0.1 - 1.0 K/uL   Eosinophils Relative 0  0 - 5 %   Eosinophils Absolute 0.0  0.0 - 0.7 K/uL   Basophils Relative 0  0 - 1 %   Basophils Absolute 0.0  0.0 - 0.1 K/uL  COMPREHENSIVE  METABOLIC PANEL      Result Value Range   Sodium 139  135 - 145 mEq/L   Potassium 3.8  3.5 - 5.1 mEq/L   Chloride 100  96 - 112 mEq/L   CO2 28  19 - 32 mEq/L   Glucose, Bld 173 (*) 70 - 99 mg/dL   BUN 47 (*) 6 - 23 mg/dL   Creatinine, Ser 1.61 (*) 0.50 - 1.10 mg/dL   Calcium 9.0  8.4 - 09.6 mg/dL   Total Protein 6.4  6.0 - 8.3 g/dL   Albumin 3.2 (*) 3.5 - 5.2 g/dL   AST 17  0 - 37 U/L   ALT 29  0 - 35 U/L   Alkaline Phosphatase 57  39 - 117 U/L   Total Bilirubin 0.3  0.3 - 1.2 mg/dL   GFR calc non Af Amer 26 (*) >90 mL/min   GFR calc Af Amer 30 (*) >90 mL/min  TROPONIN I      Result Value Range   Troponin I <0.30  <0.30 ng/mL  GLUCOSE, CAPILLARY      Result Value Range   Glucose-Capillary 172 (*) 70 - 99 mg/dL    Ct Head (brain) Wo Contrast  03/16/2013  *RADIOLOGY REPORT*  Clinical Data: Right facial droop.  Left side weakness.  CT HEAD WITHOUT CONTRAST  Technique:  Contiguous axial images were obtained from the base of the skull through the vertex without contrast.  Comparison: Brain MRI head CT scan 01/10/2013.  Findings: Postoperative change of frontal craniotomy with encephalomalacia in the frontal lobes bilaterally, worse on the left, appears unchanged.  Remote bilateral basal ganglia lacunar infarctions are identified.  No evidence of acute abnormality including infarction, hemorrhage, mass lesion, mass effect, midline shift or abnormal extra-axial fluid collection is seen.  There is no hydrocephalus with ex vacuo dilatation of the frontal horns of the lateral ventricles noted.  No pneumocephalus is identified. Air fluid levels are seen in the maxillary sinuses bilaterally. There is mucosal thickening in the sphenoid sinuses.  IMPRESSION:  1.  No acute intracranial abnormality. 2.  Air-fluid levels in the maxillary sinuses consistent with acute sinusitis.  Mucosal thickening sphenoid sinus is also noted. 3.  Status post frontal craniotomy with underlying encephalomalacia.  Chronic  microvascular ischemic change also noted.   Original Report Authenticated By: Holley Dexter, M.D.      Date: 03/16/2013    2233  Rate: 67  Rhythm: atrial fibrillation  QRS Axis: normal  Intervals: atrial filbrillation  ST/T Wave abnormalities: nonspecific T wave changes  Conduction Disutrbances:none  Narrative Interpretation: anterior infarct, age undetermined  Old EKG Reviewed: unchanged c/w 03/09/2013    0150 Speech is clearer. Facial droop is still visible.  MDM  Patient presents with facial droop, slurred speech and left leg pain. CT is negative for acute injury. Labs are normal. Reviewed results with patient. She advised that she has left sided weakness on occasion. Encouraged the use of cane or walker for ambulation in the setting of a recent fall. Patient to follow up with her PCP. Pt stable in ED with no significant deterioration in condition.The patient appears reasonably screened and/or stabilized for discharge and I doubt any other  medical condition or other [facility] requiring further screening, evaluation, or treatment in the ED at this time prior to discharge.  MDM Reviewed: nursing note and vitals Interpretation: labs, ECG and CT scan           Nicoletta Dress. Colon Branch, MD 03/17/13 1610

## 2013-03-17 NOTE — ED Notes (Signed)
Pt with continued slurred speech per daughter since yesterday, but today fell after pt became dizzy and hit her head on wall, daughter states wall has a dent where pt hit, denies LOC, denies any pain at this time, was seen yesterday

## 2013-03-17 NOTE — ED Notes (Signed)
Pt was seen in department yesterday for dizziness, weakness and slurred speech.  Per family, symptoms continue today.  Family reports that pt fell and struck head on wall about noon today.  Denies LOC.  Mild right facial droop noted.  Tongue midline.  Grasp strength equal bilaterally.

## 2013-03-20 ENCOUNTER — Other Ambulatory Visit (HOSPITAL_COMMUNITY): Payer: Self-pay | Admitting: Pulmonary Disease

## 2013-03-20 DIAGNOSIS — J441 Chronic obstructive pulmonary disease with (acute) exacerbation: Secondary | ICD-10-CM

## 2013-03-21 ENCOUNTER — Ambulatory Visit (HOSPITAL_COMMUNITY)
Admission: RE | Admit: 2013-03-21 | Discharge: 2013-03-21 | Disposition: A | Discharge status: Home / Self Care | Payer: Medicare Other | Source: Ambulatory Visit | Attending: Pulmonary Disease | Admitting: Pulmonary Disease

## 2013-03-21 DIAGNOSIS — R0989 Other specified symptoms and signs involving the circulatory and respiratory systems: Secondary | ICD-10-CM | POA: Insufficient documentation

## 2013-03-21 DIAGNOSIS — J4489 Other specified chronic obstructive pulmonary disease: Secondary | ICD-10-CM | POA: Insufficient documentation

## 2013-03-21 DIAGNOSIS — J449 Chronic obstructive pulmonary disease, unspecified: Secondary | ICD-10-CM | POA: Insufficient documentation

## 2013-03-21 DIAGNOSIS — R0609 Other forms of dyspnea: Secondary | ICD-10-CM | POA: Insufficient documentation

## 2013-03-21 MED ORDER — ALBUTEROL SULFATE (5 MG/ML) 0.5% IN NEBU
2.5000 mg | INHALATION_SOLUTION | Freq: Once | RESPIRATORY_TRACT | Status: AC
  Administered 2013-03-21: 2.5 mg via RESPIRATORY_TRACT

## 2013-03-23 NOTE — Procedures (Signed)
NAME:  [name], AKER          ACCOUNT NO.:  [id number]  MEDICAL RECORD NO.:  [id number]  LOCATION:  RESP                          FACILITY:  [facility]  PHYSICIAN:  Zaron Zwiefelhofer L. Juanetta Gosling, M.D.DATE OF BIRTH:  [birth year]  DATE OF PROCEDURE: DATE OF DISCHARGE:  03/21/2013                           PULMONARY FUNCTION TEST   REASON FOR PULMONARY FUNCTION TESTING:  Chronic obstructive pulmonary disease exacerbation.  1. Spirometry shows a mild ventilatory defect with no definite airflow     obstruction.  There are changes in the smaller airways. 2. Lung volumes show air trapping. 3. DLCO is severely reduced. 4. Airway resistance is normal. 5. This study is consistent with mild restrictive change.     Cortney Beissel L. Juanetta Gosling, M.D.     ELH/MEDQ  D:  03/22/2013  T:  03/23/2013  Job:  [id number]

## 2013-03-29 LAB — PULMONARY FUNCTION TEST

## 2013-04-02 ENCOUNTER — Encounter: Payer: Self-pay | Admitting: *Deleted

## 2013-04-04 ENCOUNTER — Encounter: Payer: Self-pay | Admitting: Cardiovascular Disease

## 2013-04-05 ENCOUNTER — Encounter: Payer: Self-pay | Admitting: Cardiovascular Disease

## 2013-04-05 ENCOUNTER — Ambulatory Visit (INDEPENDENT_AMBULATORY_CARE_PROVIDER_SITE_OTHER): Payer: Medicare Other | Admitting: Cardiovascular Disease

## 2013-04-05 VITALS — BP 164/74 | HR 68 | Ht 67.5 in | Wt 185.0 lb

## 2013-04-05 DIAGNOSIS — I10 Essential (primary) hypertension: Secondary | ICD-10-CM

## 2013-04-05 DIAGNOSIS — I739 Peripheral vascular disease, unspecified: Secondary | ICD-10-CM | POA: Insufficient documentation

## 2013-04-05 DIAGNOSIS — I4891 Unspecified atrial fibrillation: Secondary | ICD-10-CM

## 2013-04-05 NOTE — Progress Notes (Signed)
04/05/2013 [name]   [birth year]  [id number]  Primary Physician Avon Gully, MD Primary Cardiologist: Runell Gess MD [name]   HPI:  The patient is a 79 year old mildly overweight, widowed [place] American female, mother of 2, grandmother to 2 grandchildren, who I saw 6 months ago. She has a history of continued tobacco abuse, hypertension, hyperlipidemia, and peripheral vascular occlusive disease. She has a known 70% right renal artery stenosis, which we have been following by duplex ultrasound, as well as bilateral iliac and SFA disease. She has known occluded SFAs bilaterally and has had bilateral iliac stenting for chronic total occlusions in 2009 and 2010. She currently denies claudication. We have been following her Dopplers annually. She did have a negative Myoview, February 12, 2010, with normal LV function by 2D echocardiography. Recent Dopplers reveal ABIs in the 0.5 to 0.6 range bilaterally with progression of disease in her right external iliac artery, suggesting "in-stent restenosis," as well as her left external iliac artery. When I saw her last she is noted to be in atrial fibrillation. I elected to begin her on Coumadin anticoagulation. She has had coagulopathy with GI bleed requiring reverse with vitamin K. Ultimately she was changed to Eliquis. She said this is the comfort TIA and syncope. Workup including MRI of the head, CT of the head and carotid Dopplers were unrevealing. She had less than 50% bilateral internal carotid artery stenosis. She was also diagnosed with COPD. She denies chest pain..     Current Outpatient Prescriptions  Medication Sig Dispense Refill  . albuterol (PROVENTIL HFA;VENTOLIN HFA) 108 (90 BASE) MCG/ACT inhaler Inhale 2 puffs into the lungs every 6 (six) hours as needed for wheezing.  1 Inhaler  2  . apixaban (ELIQUIS) 5 MG TABS tablet Take 2.5 mg by mouth 2 (two) times daily.       Marland Kitchen aspirin EC 81 MG tablet Take 81 mg by mouth  every morning.       . labetalol (NORMODYNE) 300 MG tablet Take 300 mg by mouth 2 (two) times daily.      . QUINAPRIL HCL PO Take by mouth. Unknown doseage      . simvastatin (ZOCOR) 20 MG tablet Take 20 mg by mouth every evening.      . tiotropium (SPIRIVA) 18 MCG inhalation capsule Place 18 mcg into inhaler and inhale every morning.       No current facility-administered medications for this visit.    No Known Allergies  History   Social History  . Marital Status: Widowed    Spouse Name: N/A    Number of Children: 2  . Years of Education: N/A   Occupational History  .     Social History Main Topics  . Smoking status: Former Smoker -- 0.50 packs/day for 35 years    Types: Cigarettes    Quit date: 01/06/2013  . Smokeless tobacco: Not on file  . Alcohol Use: No  . Drug Use: No  . Sexually Active: No   Other Topics Concern  . Not on file   Social History Narrative  . No narrative on file     Review of Systems: General: negative for chills, fever, night sweats or weight changes.  Cardiovascular: negative for chest pain, dyspnea on exertion, edema, orthopnea, palpitations, paroxysmal nocturnal dyspnea or shortness of breath Dermatological: negative for rash Respiratory: negative for cough or wheezing Urologic: negative for hematuria Abdominal: negative for nausea, vomiting, diarrhea, bright red blood per rectum, melena, or hematemesis Neurologic: negative  for visual changes, syncope, or dizziness All other systems reviewed and are otherwise negative except as noted above.    Blood pressure 164/74, pulse 68, height 5' 7.5" (1.715 m), weight 185 lb (83.915 kg).  General appearance: alert and no distress Neck: no adenopathy, no carotid bruit, no JVD, supple, symmetrical, trachea midline and thyroid not enlarged, symmetric, no tenderness/mass/nodules Lungs: clear to auscultation bilaterally Heart: irregularly irregular rhythm Extremities: extremities normal, atraumatic,  no cyanosis or edema Pulses: femoral pulses were 2+ without bruits. She had absent pedal pulses.  EKG not performed today  ASSESSMENT AND PLAN:   Atrial fibrillation The patient was placed on Coumadin up-regulation back in December. She did have GI bleed and Coumadin coagulopathy requiring reversal vitamin K. Ultimately she was changed Eliquis at reduced dose because of renal function.  Peripheral vascular disease with claudication Currently denies claudications the Dopplers performed 09/13/12 revealed a right ABI of 0.5 for a left ABI 0.62. Her stents appear to have in-stent restenosis. She has occluded SFAs bilaterally.  Accelerated hypertension Patient's blood pressure has been fairly labile. Her pressure medicines have been adjusted. By mouth she's had significant orthostasis which has improved since her primary care physician readjusted her antihypertensive medications. Of note, she does have a 70% right renal artery stenosis but difficult to some end angiography which have been following as well      Runell Gess MD [facility], [facility] 04/05/2013 2:53 PM

## 2013-04-05 NOTE — Patient Instructions (Signed)
Your physician wants you to follow-up in: 6 MONTHS WITH AN EXTENDER AND 12 MONTHS WITH DR BERRY. You will receive a reminder letter in the mail two months in advance. If you don't receive a letter, please call our office to schedule the follow-up appointment.  

## 2013-04-05 NOTE — Assessment & Plan Note (Signed)
Patient's blood pressure has been fairly labile. Her pressure medicines have been adjusted. By mouth she's had significant orthostasis which has improved since her primary care physician readjusted her antihypertensive medications. Of note, she does have a 70% right renal artery stenosis but difficult to some end angiography which have been following as well

## 2013-04-05 NOTE — Assessment & Plan Note (Signed)
The patient was placed on Coumadin up-regulation back in December. She did have GI bleed and Coumadin coagulopathy requiring reversal vitamin K. Ultimately she was changed Eliquis at reduced dose because of renal function.

## 2013-04-05 NOTE — Assessment & Plan Note (Signed)
Currently denies claudications the Dopplers performed 09/13/12 revealed a right ABI of 0.5 for a left ABI 0.62. Her stents appear to have in-stent restenosis. She has occluded SFAs bilaterally.

## 2013-07-23 ENCOUNTER — Ambulatory Visit: Payer: Self-pay | Admitting: Pharmacist Clinician (PhC)/ Clinical Pharmacy Specialist

## 2013-07-23 DIAGNOSIS — Z7901 Long term (current) use of anticoagulants: Secondary | ICD-10-CM

## 2013-07-23 DIAGNOSIS — I4891 Unspecified atrial fibrillation: Secondary | ICD-10-CM

## 2013-09-19 ENCOUNTER — Ambulatory Visit (INDEPENDENT_AMBULATORY_CARE_PROVIDER_SITE_OTHER): Payer: Medicare Other | Admitting: Cardiology

## 2013-09-19 ENCOUNTER — Encounter: Payer: Self-pay | Admitting: Cardiology

## 2013-09-19 VITALS — BP 150/80 | HR 61 | Ht 67.0 in | Wt 187.0 lb

## 2013-09-19 DIAGNOSIS — I4821 Permanent atrial fibrillation: Secondary | ICD-10-CM

## 2013-09-19 DIAGNOSIS — I639 Cerebral infarction, unspecified: Secondary | ICD-10-CM

## 2013-09-19 DIAGNOSIS — Z7901 Long term (current) use of anticoagulants: Secondary | ICD-10-CM

## 2013-09-19 DIAGNOSIS — E785 Hyperlipidemia, unspecified: Secondary | ICD-10-CM

## 2013-09-19 DIAGNOSIS — I739 Peripheral vascular disease, unspecified: Secondary | ICD-10-CM

## 2013-09-19 DIAGNOSIS — J449 Chronic obstructive pulmonary disease, unspecified: Secondary | ICD-10-CM

## 2013-09-19 DIAGNOSIS — I4891 Unspecified atrial fibrillation: Secondary | ICD-10-CM

## 2013-09-19 DIAGNOSIS — I10 Essential (primary) hypertension: Secondary | ICD-10-CM

## 2013-09-19 DIAGNOSIS — I6350 Cerebral infarction due to unspecified occlusion or stenosis of unspecified cerebral artery: Secondary | ICD-10-CM

## 2013-09-19 NOTE — Assessment & Plan Note (Signed)
Old lacunar infarcts seen on CT March 2014

## 2013-09-19 NOTE — Assessment & Plan Note (Signed)
On Eliquis

## 2013-09-19 NOTE — Assessment & Plan Note (Signed)
CVR, asymptomatic

## 2013-09-19 NOTE — Assessment & Plan Note (Signed)
Followed by Dr. Hawkins. 

## 2013-09-19 NOTE — Assessment & Plan Note (Signed)
Known carotid, renal, and iliac disease. No claudication

## 2013-09-19 NOTE — Progress Notes (Signed)
09/19/2013 [name]   1931/06/14  [id number]  Primary Physicia FANTA,[name], MD Primary Cardiologist: Dr Allyson Sabal  HPI:  79 y/o followed by Dr Allyson Sabal and Dr Felecia Shelling with a long history of PVD. She has had prior bilateral iliac and renal stenting. In March 2014 she had a "TIA" and CT showed old lacunar infarcts and MRA showed 50% bilateral ICA disease. She has been scheduled for dopplers in follow up but is overdue. She denies claudication. Her B/P is controlled. Dr Felecia Shelling follows her labs. She lives alone and is independent. Her daughter is here from [place] and accompanied her to the office today for her mothers 6 month visit.   Current Outpatient Prescriptions  Medication Sig Dispense Refill  . albuterol (PROVENTIL HFA;VENTOLIN HFA) 108 (90 BASE) MCG/ACT inhaler Inhale 2 puffs into the lungs every 6 (six) hours as needed for wheezing.  1 Inhaler  2  . apixaban (ELIQUIS) 5 MG TABS tablet Take 2.5 mg by mouth 2 (two) times daily.       Marland Kitchen aspirin EC 81 MG tablet Take 81 mg by mouth every morning.       . labetalol (NORMODYNE) 300 MG tablet Take 300 mg by mouth 2 (two) times daily.      . quinapril-hydrochlorothiazide (ACCURETIC) 20-25 MG per tablet Take 1 tablet by mouth daily.      . simvastatin (ZOCOR) 20 MG tablet Take 20 mg by mouth every evening.      . tiotropium (SPIRIVA) 18 MCG inhalation capsule Place 18 mcg into inhaler and inhale every morning.      . QUINAPRIL HCL PO Take by mouth. Unknown doseage       No current facility-administered medications for this visit.    No Known Allergies  History   Social History  . Marital Status: Widowed    Spouse Name: N/A    Number of Children: 2  . Years of Education: N/A   Occupational History  .     Social History Main Topics  . Smoking status: Former Smoker -- 0.50 packs/day for 35 years    Types: Cigarettes    Quit date: 01/06/2013  . Smokeless tobacco: Not on file  . Alcohol Use: No  . Drug Use: No  . Sexual Activity: No    Other Topics Concern  . Not on file   Social History Narrative  . No narrative on file     Review of Systems: General: negative for chills, fever, night sweats or weight changes.  Cardiovascular: negative for chest pain, dyspnea on exertion, edema, orthopnea, palpitations, paroxysmal nocturnal dyspnea or shortness of breath Dermatological: negative for rash Respiratory: negative for cough or wheezing Urologic: negative for hematuria Abdominal: negative for nausea, vomiting, diarrhea, bright red blood per rectum, melena, or hematemesis Neurologic: negative for visual changes, syncope, or dizziness Low risk Myoview April 2011 Normal echo April 2011 All other systems reviewed and are otherwise negative except as noted above.    Blood pressure 150/80, pulse 61, height 5\' 7"  (1.702 m), weight 187 lb (84.823 kg).  General appearance: alert, cooperative and no distress Neck: no carotid bruit and no JVD Lungs: clear to auscultation bilaterally Heart: irregularly irregular rhythm Extremities: trace edema Rt LE  EKG AF with CVR  ASSESSMENT AND PLAN:   Permanent atrial fibrillation CVR, asymptomatic  HTN (hypertension) B/P stable  CVA (cerebral infarction) Old lacunar infarcts seen on CT March 2014  COPD (chronic obstructive pulmonary disease) Followed by Dr Juanetta Gosling  Dyslipidemia Followed by primary  MD  Long term (current) use of anticoagulants On Eliquis  Peripheral vascular disease with claudication Known carotid, renal, and iliac disease. No claudication   PLAN  We discussed smoking cessation- " I've been smoking for 70 yrs and I'm not quitting now". I have arranged for all her follow up dopplers to be done at one time in March. She will see Dr Allyson Sabal in 6 months unless her dopplers suggest she be seen sooner.   [name] KPA-C 09/19/2013 10:30 AM

## 2013-09-19 NOTE — Patient Instructions (Signed)
Your physician has requested that you have a lower or upper extremity arterial duplex. This test is an ultrasound of the arteries in the legs or arms. It looks at arterial blood flow in the legs and arms. Allow one hour for Lower and Upper Arterial scans. There are no restrictions or special instructions Your physician recommends that you schedule a follow-up appointment in: 6 months with Dr Allyson Sabal

## 2013-09-19 NOTE — Assessment & Plan Note (Signed)
Followed by primary M.D. 

## 2013-09-19 NOTE — Assessment & Plan Note (Signed)
BP stable.

## 2013-10-02 ENCOUNTER — Ambulatory Visit: Payer: Medicare Other | Admitting: Cardiology

## 2013-11-05 ENCOUNTER — Encounter (HOSPITAL_COMMUNITY): Payer: Self-pay | Admitting: Emergency Medicine

## 2013-11-05 ENCOUNTER — Inpatient Hospital Stay (HOSPITAL_COMMUNITY)
Admission: EM | Admit: 2013-11-05 | Discharge: 2013-11-13 | DRG: 292 | Disposition: A | Discharge status: Home / Self Care | Payer: Medicare Other | Attending: Internal Medicine | Admitting: Internal Medicine

## 2013-11-05 ENCOUNTER — Emergency Department (HOSPITAL_COMMUNITY): Payer: Medicare Other

## 2013-11-05 DIAGNOSIS — E7800 Pure hypercholesterolemia, unspecified: Secondary | ICD-10-CM | POA: Diagnosis present

## 2013-11-05 DIAGNOSIS — J449 Chronic obstructive pulmonary disease, unspecified: Secondary | ICD-10-CM | POA: Diagnosis present

## 2013-11-05 DIAGNOSIS — I4821 Permanent atrial fibrillation: Secondary | ICD-10-CM | POA: Diagnosis present

## 2013-11-05 DIAGNOSIS — I319 Disease of pericardium, unspecified: Secondary | ICD-10-CM | POA: Diagnosis present

## 2013-11-05 DIAGNOSIS — Z8249 Family history of ischemic heart disease and other diseases of the circulatory system: Secondary | ICD-10-CM

## 2013-11-05 DIAGNOSIS — I739 Peripheral vascular disease, unspecified: Secondary | ICD-10-CM | POA: Diagnosis present

## 2013-11-05 DIAGNOSIS — I313 Pericardial effusion (noninflammatory): Secondary | ICD-10-CM

## 2013-11-05 DIAGNOSIS — I10 Essential (primary) hypertension: Secondary | ICD-10-CM

## 2013-11-05 DIAGNOSIS — I509 Heart failure, unspecified: Secondary | ICD-10-CM | POA: Diagnosis present

## 2013-11-05 DIAGNOSIS — Z800 Family history of malignant neoplasm of digestive organs: Secondary | ICD-10-CM

## 2013-11-05 DIAGNOSIS — I3139 Other pericardial effusion (noninflammatory): Secondary | ICD-10-CM

## 2013-11-05 DIAGNOSIS — I2510 Atherosclerotic heart disease of native coronary artery without angina pectoris: Secondary | ICD-10-CM | POA: Diagnosis present

## 2013-11-05 DIAGNOSIS — Z8673 Personal history of transient ischemic attack (TIA), and cerebral infarction without residual deficits: Secondary | ICD-10-CM

## 2013-11-05 DIAGNOSIS — Z7901 Long term (current) use of anticoagulants: Secondary | ICD-10-CM

## 2013-11-05 DIAGNOSIS — I4891 Unspecified atrial fibrillation: Secondary | ICD-10-CM | POA: Diagnosis present

## 2013-11-05 DIAGNOSIS — Z86718 Personal history of other venous thrombosis and embolism: Secondary | ICD-10-CM

## 2013-11-05 DIAGNOSIS — I5033 Acute on chronic diastolic (congestive) heart failure: Secondary | ICD-10-CM | POA: Diagnosis present

## 2013-11-05 DIAGNOSIS — I119 Hypertensive heart disease without heart failure: Secondary | ICD-10-CM | POA: Diagnosis present

## 2013-11-05 DIAGNOSIS — I63032 Cerebral infarction due to thrombosis of left carotid artery: Secondary | ICD-10-CM | POA: Diagnosis present

## 2013-11-05 DIAGNOSIS — F17200 Nicotine dependence, unspecified, uncomplicated: Secondary | ICD-10-CM | POA: Diagnosis present

## 2013-11-05 DIAGNOSIS — I110 Hypertensive heart disease with heart failure: Principal | ICD-10-CM | POA: Diagnosis present

## 2013-11-05 DIAGNOSIS — E782 Mixed hyperlipidemia: Secondary | ICD-10-CM | POA: Diagnosis present

## 2013-11-05 HISTORY — DX: Essential (primary) hypertension: I10

## 2013-11-05 HISTORY — DX: Mixed hyperlipidemia: E78.2

## 2013-11-05 LAB — COMPREHENSIVE METABOLIC PANEL
ALT: 22 U/L (ref 0–35)
AST: 20 U/L (ref 0–37)
Albumin: 3.5 g/dL (ref 3.5–5.2)
BUN: 27 mg/dL — ABNORMAL HIGH (ref 6–23)
CO2: 28 mEq/L (ref 19–32)
Calcium: 9.7 mg/dL (ref 8.4–10.5)
Sodium: 144 mEq/L (ref 135–145)
Total Protein: 7 g/dL (ref 6.0–8.3)

## 2013-11-05 LAB — CBC WITH DIFFERENTIAL/PLATELET
Basophils Absolute: 0 10*3/uL (ref 0.0–0.1)
Eosinophils Absolute: 0.2 10*3/uL (ref 0.0–0.7)
Eosinophils Relative: 2 % (ref 0–5)
Lymphocytes Relative: 17 % (ref 12–46)
MCH: 28 pg (ref 26.0–34.0)
MCV: 89.4 fL (ref 78.0–100.0)
Neutro Abs: 5.7 10*3/uL (ref 1.7–7.7)
Platelets: 217 10*3/uL (ref 150–400)
RDW: 13.7 % (ref 11.5–15.5)
WBC: 7.7 10*3/uL (ref 4.0–10.5)

## 2013-11-05 LAB — PROTIME-INR
INR: 1.2 (ref 0.00–1.49)
Prothrombin Time: 14.9 seconds (ref 11.6–15.2)

## 2013-11-05 LAB — URINALYSIS, ROUTINE W REFLEX MICROSCOPIC
Glucose, UA: NEGATIVE mg/dL
Leukocytes, UA: NEGATIVE
Specific Gravity, Urine: 1.025 (ref 1.005–1.030)
pH: 6 (ref 5.0–8.0)

## 2013-11-05 LAB — TROPONIN I: Troponin I: <0.3 ng/mL (ref <0.30)

## 2013-11-05 LAB — URINE MICROSCOPIC-ADD ON

## 2013-11-05 MED ORDER — ALBUTEROL SULFATE (5 MG/ML) 0.5% IN NEBU
2.5000 mg | INHALATION_SOLUTION | Freq: Once | RESPIRATORY_TRACT | Status: AC
  Administered 2013-11-05: 2.5 mg via RESPIRATORY_TRACT
  Filled 2013-11-05: qty 0.5

## 2013-11-05 MED ORDER — IPRATROPIUM BROMIDE 0.02 % IN SOLN
0.5000 mg | Freq: Once | RESPIRATORY_TRACT | Status: AC
  Administered 2013-11-05: 0.5 mg via RESPIRATORY_TRACT
  Filled 2013-11-05: qty 2.5

## 2013-11-05 MED ORDER — SIMVASTATIN 20 MG PO TABS
20.0000 mg | ORAL_TABLET | Freq: Every evening | ORAL | Status: DC
  Administered 2013-11-05 – 2013-11-12 (×8): 20 mg via ORAL
  Filled 2013-11-05 (×8): qty 1

## 2013-11-05 MED ORDER — TIOTROPIUM BROMIDE MONOHYDRATE 18 MCG IN CAPS
18.0000 ug | ORAL_CAPSULE | Freq: Every morning | RESPIRATORY_TRACT | Status: DC
  Administered 2013-11-06 – 2013-11-13 (×7): 18 ug via RESPIRATORY_TRACT
  Filled 2013-11-05 (×2): qty 5

## 2013-11-05 MED ORDER — QUINAPRIL-HYDROCHLOROTHIAZIDE 20-25 MG PO TABS: 1.0000 | ORAL_TABLET | Freq: Every day | ORAL | Status: DC

## 2013-11-05 MED ORDER — POTASSIUM CHLORIDE CRYS ER 20 MEQ PO TBCR
40.0000 meq | EXTENDED_RELEASE_TABLET | Freq: Two times a day (BID) | ORAL | Status: DC
  Administered 2013-11-05 – 2013-11-13 (×16): 40 meq via ORAL
  Filled 2013-11-05 (×16): qty 2

## 2013-11-05 MED ORDER — ASPIRIN EC 81 MG PO TBEC
81.0000 mg | DELAYED_RELEASE_TABLET | Freq: Every morning | ORAL | Status: DC
  Administered 2013-11-06 – 2013-11-13 (×8): 81 mg via ORAL
  Filled 2013-11-05 (×8): qty 1

## 2013-11-05 MED ORDER — FUROSEMIDE 10 MG/ML IJ SOLN
40.0000 mg | Freq: Two times a day (BID) | INTRAMUSCULAR | Status: DC
  Administered 2013-11-05 – 2013-11-06 (×3): 40 mg via INTRAVENOUS
  Filled 2013-11-05 (×3): qty 4

## 2013-11-05 MED ORDER — MOMETASONE FURO-FORMOTEROL FUM 100-5 MCG/ACT IN AERO
2.0000 | INHALATION_SPRAY | Freq: Two times a day (BID) | RESPIRATORY_TRACT | Status: DC
Start: 2013-11-05 — End: 2013-11-13
  Administered 2013-11-05 – 2013-11-13 (×16): 2 via RESPIRATORY_TRACT
  Filled 2013-11-05: qty 8.8

## 2013-11-05 MED ORDER — FUROSEMIDE 40 MG PO TABS
40.0000 mg | ORAL_TABLET | Freq: Once | ORAL | Status: AC
  Administered 2013-11-05: 40 mg via ORAL
  Filled 2013-11-05: qty 1

## 2013-11-05 MED ORDER — ALBUTEROL SULFATE HFA 108 (90 BASE) MCG/ACT IN AERS
2.0000 | INHALATION_SPRAY | Freq: Four times a day (QID) | RESPIRATORY_TRACT | Status: DC | PRN
  Administered 2013-11-09 – 2013-11-11 (×7): 2 via RESPIRATORY_TRACT
  Filled 2013-11-05: qty 6.7

## 2013-11-05 MED ORDER — FLUTICASONE FUROATE-VILANTEROL 100-25 MCG/INH IN AEPB: 1.0000 | INHALATION_SPRAY | Freq: Every day | RESPIRATORY_TRACT | Status: DC

## 2013-11-05 MED ORDER — ALBUTEROL SULFATE (5 MG/ML) 0.5% IN NEBU
5.0000 mg | INHALATION_SOLUTION | Freq: Once | RESPIRATORY_TRACT | Status: AC
  Administered 2013-11-05: 5 mg via RESPIRATORY_TRACT
  Filled 2013-11-05: qty 1

## 2013-11-05 MED ORDER — PREDNISONE 20 MG PO TABS
60.0000 mg | ORAL_TABLET | Freq: Every day | ORAL | Status: DC
  Administered 2013-11-06 – 2013-11-11 (×6): 60 mg via ORAL
  Filled 2013-11-05 (×6): qty 3

## 2013-11-05 MED ORDER — APIXABAN 2.5 MG PO TABS
2.5000 mg | ORAL_TABLET | Freq: Two times a day (BID) | ORAL | Status: DC
Start: 2013-11-05 — End: 2013-11-13
  Administered 2013-11-05 – 2013-11-13 (×16): 2.5 mg via ORAL
  Filled 2013-11-05 (×20): qty 1

## 2013-11-05 MED ORDER — LISINOPRIL 10 MG PO TABS
20.0000 mg | ORAL_TABLET | Freq: Every day | ORAL | Status: DC
  Administered 2013-11-05 – 2013-11-06 (×2): 20 mg via ORAL
  Filled 2013-11-05 (×2): qty 2

## 2013-11-05 MED ORDER — FUROSEMIDE 10 MG/ML IJ SOLN
40.0000 mg | Freq: Once | INTRAMUSCULAR | Status: AC
  Administered 2013-11-05: 40 mg via INTRAVENOUS
  Filled 2013-11-05: qty 4

## 2013-11-05 MED ORDER — NITROGLYCERIN IN D5W 200-5 MCG/ML-% IV SOLN
5.0000 ug/min | INTRAVENOUS | Status: DC
  Administered 2013-11-05: 5 ug/min via INTRAVENOUS
  Administered 2013-11-06: 10 ug/min via INTRAVENOUS
  Administered 2013-11-08: 25 ug/min via INTRAVENOUS
  Filled 2013-11-05 (×3): qty 250

## 2013-11-05 MED ORDER — LABETALOL HCL 200 MG PO TABS
300.0000 mg | ORAL_TABLET | Freq: Two times a day (BID) | ORAL | Status: DC
  Administered 2013-11-05 – 2013-11-06 (×4): 300 mg via ORAL
  Filled 2013-11-05 (×4): qty 2

## 2013-11-05 NOTE — ED Provider Notes (Signed)
CSN: [id number]     Arrival date & time 11/05/13  0801 History   This chart was scribed for [name], MD by Smiley Houseman, ED Scribe. The patient was seen in room APA07/APA07. Patient's care was started at 8:35 AM.    Chief Complaint  Patient presents with  . Bronchitis    The history is provided by the patient and a relative. No language interpreter was used.   HPI Comments: [name] is a 79 y.o. female with h/o of COPD who presents to the Emergency Department complaining of worsening chronic SOB that started last night.  She used her rescue inhaler last night about every hour with minimal improvment.  She states the SOB worsens when she lays down.  At baseline she uses her inhaler two to three times a day.  She denies fever and cough.  She has h/o of COPD, because she is a smoker.  She states she quit yesterday.  She denies using oxygen at home.  She takes Spiriva.  She has H/o of afib which she takes medicine for and h/o of a DVT with stent placement.  Although family is confused whether she was diagnosed with DVT.  She denies any cardiac stents and CHF.     PCP-Dr. Felecia Shelling Dr. Edwyna Shell -Cardiologist   Past Medical History  Diagnosis Date  . Hypercholesteremia   . Coronary artery disease   . PVD (peripheral vascular disease)   . Hypertension   . DVT (deep venous thrombosis) 05/2008  . Bilateral iliac artery occlusion   . Aortic valve sclerosis   . Atrial fibrillation   . Stroke     "mild," Mar 16, 2013   Past Surgical History  Procedure Laterality Date  . Iliac artery stent Left 05/21/2008    Dr. Allyson Sabal  . Iliac artery stent Right 01/07/2009    Dr. Allyson Sabal  . Brain tumor excision  1999    for hemangioma [facility])  . Cataract extraction Bilateral   . Lens replacement Left   . Doppler echocardiography  02/12/2010    EF =>55%  . Doppler echocardiography  03/27/2008    EF =>55% NORM. LV  . Nm myocar perf wall motion  02/12/2010    PERSANTINE    EF 57%;LV norm  .  Nm myocar perf wall motion  03/27/2008    Persantine   EF63%  LV norm  . Lea doppler  09/13/2012    rgt EIA stent >60%;rgt SFA occluded at prox. and mid level w/reconstitution at distal SFA/pop.;rgt PTA occlude;lft EIAstent >50%;lft mid SFA occluded;lft ATA & PTA occluded  . Renal duplex doppler  02/25/2012    RGT artery 60-99%;lft renal artery no evidence siginificant reduction;rgt & lft kidneys norm size    Family History  Problem Relation Age of Onset  . Hypertension Mother   . Heart attack Father   . Heart disease Sister   . Colon cancer Sister   . Heart attack Brother    History  Substance Use Topics  . Smoking status: Current Every Day Smoker -- 0.50 packs/day for 35 years    Types: Cigarettes  . Smokeless tobacco: Not on file  . Alcohol Use: No   OB History   Grav Para Term Preterm Abortions TAB SAB Ect Mult Living                 Review of Systems  Constitutional: Negative for fever and chills.  HENT: Negative for congestion, rhinorrhea and sore throat.   Respiratory:  Positive for shortness of breath. Negative for cough.   Cardiovascular: Negative for chest pain.  Gastrointestinal: Negative for nausea, vomiting, abdominal pain and diarrhea.  Musculoskeletal: Negative for back pain.  Skin: Negative for color change and rash.  Neurological: Negative for syncope.  All other systems reviewed and are negative.    Allergies  Review of patient's allergies indicates no known allergies.  Home Medications   Current Outpatient Rx  Name  Route  Sig  Dispense  Refill  . albuterol (PROVENTIL HFA;VENTOLIN HFA) 108 (90 BASE) MCG/ACT inhaler   Inhalation   Inhale 2 puffs into the lungs every 6 (six) hours as needed for wheezing.   1 Inhaler   2   . apixaban (ELIQUIS) 2.5 MG TABS tablet   Oral   Take 2.5 mg by mouth 2 (two) times daily.         Marland Kitchen aspirin EC 81 MG tablet   Oral   Take 81 mg by mouth every morning.          . Fluticasone Furoate-Vilanterol (BREO  ELLIPTA) 100-25 MCG/INH AEPB   Inhalation   Inhale 1 puff into the lungs daily.         Marland Kitchen labetalol (NORMODYNE) 300 MG tablet   Oral   Take 300 mg by mouth 2 (two) times daily.         . quinapril-hydrochlorothiazide (ACCURETIC) 20-25 MG per tablet   Oral   Take 1 tablet by mouth daily.         . simvastatin (ZOCOR) 20 MG tablet   Oral   Take 20 mg by mouth every evening.         . tiotropium (SPIRIVA) 18 MCG inhalation capsule   Inhalation   Place 18 mcg into inhaler and inhale every morning.          Triage Vitals: BP 204/93  Pulse 75  Temp(Src) 97.8 F (36.6 C)  Resp 18  Ht 5' 7.5" (1.715 m)  Wt 187 lb (84.823 kg)  BMI 28.84 kg/m2  SpO2 95% Physical Exam  Nursing note and vitals reviewed. Constitutional: She is oriented to person, place, and time. She appears well-developed and well-nourished. No distress.  HENT:  Head: Normocephalic and atraumatic.  Eyes: Conjunctivae and EOM are normal.  Neck: Normal range of motion. No tracheal deviation present.  Cardiovascular: Normal rate, regular rhythm, normal heart sounds and intact distal pulses.   Pulmonary/Chest: Effort normal and breath sounds normal. No respiratory distress. She has no wheezes.  Decreased air movement throughout.  Musculoskeletal: Normal range of motion. She exhibits no edema.  Neurological: She is alert and oriented to person, place, and time. No cranial nerve deficit.  Skin: Skin is warm and dry.  Psychiatric: She has a normal mood and affect. Her behavior is normal. Judgment and thought content normal.    ED Course  Procedures (including critical care time) DIAGNOSTIC STUDIES: Oxygen Saturation is 96% on RA, normal by my interpretation.    COORDINATION OF CARE: 8:47 AM-Will order EKG.  Will order breathing treatment.  Patient informed of current plan of treatment and evaluation and agrees with plan.    Labs Review Labs Reviewed  COMPREHENSIVE METABOLIC PANEL - Abnormal; Notable for  the following:    Potassium 3.4 (*)    Glucose, Bld 126 (*)    BUN 27 (*)    Creatinine, Ser 1.18 (*)    GFR calc non Af Amer 42 (*)    GFR calc Af Amer 49 (*)  All other components within normal limits  D-DIMER, QUANTITATIVE - Abnormal; Notable for the following:    D-Dimer, Quant 0.67 (*)    All other components within normal limits  PRO B NATRIURETIC PEPTIDE - Abnormal; Notable for the following:    Pro B Natriuretic peptide (BNP) 2646.0 (*)    All other components within normal limits  URINALYSIS, ROUTINE W REFLEX MICROSCOPIC - Abnormal; Notable for the following:    Protein, ur 100 (*)    All other components within normal limits  CBC WITH DIFFERENTIAL  TROPONIN I  URINE MICROSCOPIC-ADD ON  PROTIME-INR   Imaging Review Dg Chest Port 1 View  11/05/2013   CLINICAL DATA:  Bronchitis, shortness of breath, COPD, hypertension, ETOH use  EXAM: PORTABLE CHEST - 1 VIEW  COMPARISON:  02/26/2013  FINDINGS: Stable cardiomegaly. Diffuse thickening of the interstitial markings. Areas of peribronchial cuffing. Indistinctness of the pulmonary vasculature. No focal region of consolidation or focal infiltrates. The osseous structures unremarkable.  IMPRESSION: Interstitial infiltrate likely reflecting pulmonary edema mild focal regions of consolidation or focal infiltrates. Cardiomegaly.   Electronically Signed   By: Salome Holmes M.D.   On: 11/05/2013 09:22    EKG Interpretation    Date/Time:  Monday November 05 2013 09:23:35 EST Ventricular Rate:  75 PR Interval:    QRS Duration: 86 QT Interval:  412 QTC Calculation: 460 R Axis:   -28 Text Interpretation:  Atrial fibrillation Left ventricular hypertrophy ST \\T \ T wave abnormality, consider lateral ischemia Abnormal ECG When compared with ECG of 16-Mar-2013 22:33, ST now depressed in Lateral leads Confirmed by Kellyn Mansfield MD, Eduard Penkala ([id number]) on 11/05/2013 10:02:39 AM            MDM  This is an 79 year old female patient of Dr. Felecia Shelling as  who presents today with increased dyspnea consistent with congestive heart failure. She has a history of COPD but does not appear to have any acute exacerbation of her COPD. She also has a history of DVT and is on [place] for this. The patient is extremely hypertensive here and is being diuresed with Lasix and is placed on a nitroglycerin drip with good results. I have discussed her care with Dr. Felecia Shelling and she will be admitted to his service.   [name], MD 11/05/13 [phone]

## 2013-11-05 NOTE — ED Notes (Signed)
Pt reports being sick for the last 2-3  Months with bronchitis. Feeling worse over the last day, has been using her home neb ttreatments w/ no relief. No fever. No cough. Denies any cp or nausea.

## 2013-11-05 NOTE — H&P (Signed)
SIMRAT KENDRICK MRN: [id number] DOB/AGE: Mar 21, 1931 79 y.o. Primary Care Physician:Zair Borawski, MD Admit date: 11/05/2013 Chief Complaint:  Shortness of breath HPI:  This is an 79 years old female patient came to ER with the above complaint. She has history multiple medical illnesses including COPD developed worsening shortness of breath. She was using her nebulizer treatment but her symptom continue to get worse. No history of chest pain, fever or chills. Her evaluation in Er showed congestive changes and also her B/p was elevated. No nausea, vomiting abdominal pain, dysuria, urgency or frequency of urination..   Past Medical History  Diagnosis Date  . Hypercholesteremia   . Coronary artery disease   . PVD (peripheral vascular disease)   . Hypertension   . DVT (deep venous thrombosis) 05/2008  . Bilateral iliac artery occlusion   . Aortic valve sclerosis   . Atrial fibrillation   . Stroke     "mild," Mar 16, 2013   Past Surgical History  Procedure Laterality Date  . Iliac artery stent Left 05/21/2008    Dr. Allyson Sabal  . Iliac artery stent Right 01/07/2009    Dr. Allyson Sabal  . Brain tumor excision  1999    for hemangioma [facility])  . Cataract extraction Bilateral   . Lens replacement Left   . Doppler echocardiography  02/12/2010    EF =>55%  . Doppler echocardiography  03/27/2008    EF =>55% NORM. LV  . Nm myocar perf wall motion  02/12/2010    PERSANTINE    EF 57%;LV norm  . Nm myocar perf wall motion  03/27/2008    Persantine   EF63%  LV norm  . Lea doppler  09/13/2012    rgt EIA stent >60%;rgt SFA occluded at prox. and mid level w/reconstitution at distal SFA/pop.;rgt PTA occlude;lft EIAstent >50%;lft mid SFA occluded;lft ATA & PTA occluded  . Renal duplex doppler  02/25/2012    RGT artery 60-99%;lft renal artery no evidence siginificant reduction;rgt & lft kidneys norm size         Family History  Problem Relation Age of Onset  . Hypertension Mother   . Heart attack  Father   . Heart disease Sister   . Colon cancer Sister   . Heart attack Brother     Social History:  reports that she has been smoking Cigarettes.  She has a 17.5 pack-year smoking history. She does not have any smokeless tobacco history on file. She reports that she does not drink alcohol or use illicit drugs.  Allergies: No Known Allergies  Medications Prior to Admission  Medication Sig Dispense Refill  . albuterol (PROVENTIL HFA;VENTOLIN HFA) 108 (90 BASE) MCG/ACT inhaler Inhale 2 puffs into the lungs every 6 (six) hours as needed for wheezing.  1 Inhaler  2  . apixaban (ELIQUIS) 2.5 MG TABS tablet Take 2.5 mg by mouth 2 (two) times daily.      Marland Kitchen aspirin EC 81 MG tablet Take 81 mg by mouth every morning.       . Fluticasone Furoate-Vilanterol (BREO ELLIPTA) 100-25 MCG/INH AEPB Inhale 1 puff into the lungs daily.      Marland Kitchen labetalol (NORMODYNE) 300 MG tablet Take 300 mg by mouth 2 (two) times daily.      . quinapril-hydrochlorothiazide (ACCURETIC) 20-25 MG per tablet Take 1 tablet by mouth daily.      . simvastatin (ZOCOR) 20 MG tablet Take 20 mg by mouth every evening.      . tiotropium (SPIRIVA) 18 MCG inhalation capsule Place  18 mcg into inhaler and inhale every morning.           ZOX:WRUEA from the symptoms mentioned above,there are no other symptoms referable to all systems reviewed.  Physical Exam: Blood pressure 173/105, pulse 72, temperature 98.4 F (36.9 C), temperature source Oral, resp. rate 21, height 5' 7.5" (1.715 m), weight 82.9 kg (182 lb 12.2 oz), SpO2 90.00%. General condition- alert and awake and sick looking HE ENT- pupils equal and reactive, neck supple Chest- decreased air entry, bilateral expiratory wheezes CVS- S1 and S2 heard, irregularly irregular ABD- soft and lax, bowel sound, no mass or orgaanomegally EXT- 2++    Recent Labs  11/05/13 0851  WBC 7.7  NEUTROABS 5.7  HGB 12.2  HCT 39.0  MCV 89.4  PLT 217    Recent Labs  11/05/13 0851  NA  144  K 3.4*  CL 105  CO2 28  GLUCOSE 126*  BUN 27*  CREATININE 1.18*  CALCIUM 9.7  lablast2(ast:2,ALT:2,alkphos:2,bilitot:2,prot:2,albumin:2)@    Recent Results (from the past 240 hour(s))  MRSA PCR SCREENING     Status: None   Collection Time    11/05/13  2:54 PM      Result Value Range Status   MRSA by PCR NEGATIVE  NEGATIVE Final   Comment:            The GeneXpert MRSA Assay (FDA     approved for NASAL specimens     only), is one component of a     comprehensive MRSA colonization     surveillance program. It is not     intended to diagnose MRSA     infection nor to guide or     monitor treatment for     MRSA infections.     Dg Chest Port 1 View  11/05/2013   CLINICAL DATA:  Bronchitis, shortness of breath, COPD, hypertension, ETOH use  EXAM: PORTABLE CHEST - 1 VIEW  COMPARISON:  02/26/2013  FINDINGS: Stable cardiomegaly. Diffuse thickening of the interstitial markings. Areas of peribronchial cuffing. Indistinctness of the pulmonary vasculature. No focal region of consolidation or focal infiltrates. The osseous structures unremarkable.  IMPRESSION: Interstitial infiltrate likely reflecting pulmonary edema mild focal regions of consolidation or focal infiltrates. Cardiomegaly.   Electronically Signed   By: Salome Holmes M.D.   On: 11/05/2013 09:22   Impression: 1. Pulmonary congestion R/O acute CHF 2. Hypertension- poorly controlled 3. COPD 4. Atrial fibrillation on anticoagulation 5. H/O PVD 6. Tobacco abuse Active Problems:   CHF (congestive heart failure)     Plan: Medications reviewed Will continue Iv diuretics and nitroglycerine drip.  Will monitor BMP Will repeat Echo Cardiology consult.      Talbot [facility] Pager [phone]  11/05/2013, 8:45 PM

## 2013-11-05 NOTE — ED Notes (Signed)
Pt denies cp/sob/dizziness. Up to bsc. Nad noted.

## 2013-11-05 NOTE — ED Notes (Signed)
Pt ambulated to restroom x 2. Denies cp/ha/dizziness/sob. nad noted.

## 2013-11-06 ENCOUNTER — Encounter (HOSPITAL_COMMUNITY): Payer: Self-pay | Admitting: Cardiology

## 2013-11-06 DIAGNOSIS — I5033 Acute on chronic diastolic (congestive) heart failure: Secondary | ICD-10-CM

## 2013-11-06 DIAGNOSIS — I10 Essential (primary) hypertension: Secondary | ICD-10-CM

## 2013-11-06 DIAGNOSIS — I319 Disease of pericardium, unspecified: Secondary | ICD-10-CM

## 2013-11-06 DIAGNOSIS — J449 Chronic obstructive pulmonary disease, unspecified: Secondary | ICD-10-CM

## 2013-11-06 DIAGNOSIS — I4891 Unspecified atrial fibrillation: Secondary | ICD-10-CM

## 2013-11-06 DIAGNOSIS — J4489 Other specified chronic obstructive pulmonary disease: Secondary | ICD-10-CM

## 2013-11-06 LAB — BASIC METABOLIC PANEL
Calcium: 9.4 mg/dL (ref 8.4–10.5)
GFR calc Af Amer: 37 mL/min — ABNORMAL LOW (ref >90)
GFR calc non Af Amer: 32 mL/min — ABNORMAL LOW (ref >90)
Potassium: 3.7 mEq/L (ref 3.7–5.3)
Sodium: 142 mEq/L (ref 137–147)

## 2013-11-06 LAB — CBC
MCH: 28.1 pg (ref 26.0–34.0)
MCHC: 31.4 g/dL (ref 30.0–36.0)
RDW: 13.7 % (ref 11.5–15.5)

## 2013-11-06 MED ORDER — LIVING BETTER WITH HEART FAILURE BOOK
Freq: Once | Status: AC
  Administered 2013-11-06: 08:00:00
  Filled 2013-11-06: qty 1

## 2013-11-06 MED ORDER — SODIUM CHLORIDE 0.9 % IV SOLN
Freq: Once | INTRAVENOUS | Status: AC
  Administered 2013-11-06: 10:00:00 via INTRAVENOUS

## 2013-11-06 NOTE — Consult Note (Signed)
Primary cardiologist: Dr. York Ram Consulting cardiologist: Dr. Jonelle Sidle  Clinical Summary Ms. Rappleye is an 79 y.o.female followed by Dr. Allyson Sabal with [facility], history detailed below. She is now admitted by Dr. Felecia Shelling with recent shortness of breath at rest. She states she woke up this morning feeling short of breath and did not have typical improvement with her MDIs. She does not endorse any chest pain or palpitations. Has had some recent ankle edema. She reports compliance with her medications.   Blood pressure was elevated at presentation, and she has been treated with IV Lasix with good diuresis so far. Initial troponin I is negative arguing against ACS, pro-BNP elevated at 2646. Chest x-ray describes interstitial edema, although developing infiltrates also possible. Cardiomegaly noted  Echocardiogram from February of this year revealed severe LVH with LVEF 60-65%, diastolic function not assessed, mild mitral regurgitation, moderate to severe left atrial enlargement, trivial pericardial effusion.   No Known Allergies  Medications Scheduled Medications: . apixaban  2.5 mg Oral BID  . aspirin EC  81 mg Oral q morning - 10a  . furosemide  40 mg Intravenous BID  . labetalol  300 mg Oral BID  . lisinopril  20 mg Oral Daily  . Living Better with Heart Failure Book   Does not apply Once  . mometasone-formoterol  2 puff Inhalation BID  . potassium chloride  40 mEq Oral BID  . predniSONE  60 mg Oral Q breakfast  . simvastatin  20 mg Oral QPM  . tiotropium  18 mcg Inhalation q morning - 10a    Infusions: . nitroGLYCERIN 35 mcg/min (11/05/13 1800)    PRN Medications: albuterol   Past Medical History  Diagnosis Date  . Mixed hyperlipidemia   . Coronary artery disease     Followed by Dr[facility] Al[facility]son [facility] with [facility]  . PVD (peripheral vascular disease)     Right ABI of 0.5 and left ABI 0.62, occluded bilateral SFAs  . Essential hypertension, benign   . DVT (deep venous  thrombosis) 05/2008  . Bilateral iliac artery occlusion   . Aortic valve sclerosis   . Atrial fibrillation     History of GI bleed on Coumadin  . Stroke 03/2013    Reportedly mild    Past Surgical History  Procedure Laterality Date  . Iliac artery stent Left 05/21/2008    Dr. Allyson Sabal  . Iliac artery stent Right 01/07/2009    Dr. Allyson Sabal  . Brain tumor excision  1999    Hemangioma [facility])  . Cataract extraction Bilateral   . Lens replacement Left   . Doppler echocardiography  02/12/2010    EF =>55%  . Nm myocar perf wall motion  02/12/2010    PERSANTINE: EF 57%;LV norm  . Lea doppler  09/13/2012    Right EIA stent >60%;rgt SFA occluded at prox. and mid level w/reconstitution at distal SFA/pop.;rgt PTA occlude;lft EIAstent >50%;lft mid SFA occluded;lft ATA & PTA occluded  . Renal duplex doppler  02/25/2012    Right 60-99%;lft renal artery no evidence siginificant reduction;rgt & lft kidneys norm size     Family History  Problem Relation Age of Onset  . Hypertension Mother   . Heart attack Father   . Heart disease Sister   . Colon cancer Sister   . Heart attack Brother     Social History Ms. Weyland reports that she has been smoking Cigarettes.  She has a 17.5 pack-year smoking history. She does not have any smokeless tobacco history  on file. Ms. Stepanian reports that she does not drink alcohol.  Review of Systems No fevers or chills, no cough. Reports no bleeding problems on Eliquis. No recent chest pain or palpitations, no syncope. Does have claudication symptoms. Otherwise negative.   Physical Examination Blood pressure 149/80, pulse 72, temperature 98.4 F (36.9 C), temperature source Oral, resp. rate 19, height 5' 7.5" (1.715 m), weight 182 lb 12.2 oz (82.9 kg), SpO2 90.00%.  Intake/Output Summary (Last 24 hours) at 11/06/13 0803 Last data filed at 11/05/13 1900  Gross per 24 hour  Intake 289.58 ml  Output   2050 ml  Net -1760.42 ml   Patient appears  comfortable at rest. HEENT: Conjunctiva and lids normal, oropharynx clear. Neck: Supple, no elevated JVP, no thyromegaly. Lungs: Diminished breath sounds although relatively clear to auscultation, nonlabored breathing at rest. Cardiac: Regular rate and rhythm, no S3, soft systolic murmur, no pericardial rub. Abdomen: Soft, nontender, bowel sounds present, no guarding or rebound. Extremities: Trace edema, distal pulses 1+. Skin: Warm and dry. Musculoskeletal: No kyphosis. Neuropsychiatric: Alert and oriented x3, affect grossly appropriate.   Lab Results  Basic Metabolic Panel:  Recent Labs Lab 11/05/13 0851 11/06/13 0501  NA 144 142  K 3.4* 3.7  CL 105 101  CO2 28 31  GLUCOSE 126* 100*  BUN 27* 28*  CREATININE 1.18* 1.49*  CALCIUM 9.7 9.4    Liver Function Tests:  Recent Labs Lab 11/05/13 0851  AST 20  ALT 22  ALKPHOS 81  BILITOT 0.7  PROT 7.0  ALBUMIN 3.5    CBC:  Recent Labs Lab 11/05/13 0851 11/06/13 0501  WBC 7.7 6.7  NEUTROABS 5.7  --   HGB 12.2 11.7*  HCT 39.0 37.3  MCV 89.4 89.4  PLT 217 202    Cardiac Enzymes:  Recent Labs Lab 11/05/13 1017  TROPONINI <0.30    ECG Atrial fibrillation with LVH and repolarization abnormalities.  Imaging PORTABLE CHEST - 1 VIEW  COMPARISON: 02/26/2013  FINDINGS: Stable cardiomegaly. Diffuse thickening of the interstitial markings. Areas of peribronchial cuffing. Indistinctness of the pulmonary vasculature. No focal region of consolidation or focal infiltrates. The osseous structures unremarkable.  IMPRESSION: Interstitial infiltrate likely reflecting pulmonary edema mild focal regions of consolidation or focal infiltrates. Cardiomegaly.   Impression  1. Presentation with shortness of breath, presently feeling much better. She has been treated with IV Lasix, approximately 1800 cc out at this point. She states that her MDIs were not effective this morning. Has had some mild ankle edema. Possible  acute on chronic diastolic heart failure based on chest x-ray and elevated pro-BNP. No definite ACS based on initial cardiac markers. She has had no recent chest pain. She is on HCTZ 25 mg for diuretic as an outpatient.  2. Atrial fibrillation, currently rate controlled and on Eliquis for stroke prophylaxis.  3. History of CAD, details not clear. She is followed by Dr. Allyson Sabal. Record review finds no ischemia by Myoview in 2011.  4. Peripheral arterial disease followed by Dr. Allyson Sabal.  5. Presumed chronic lung disease, patient uses MDIs, both standing and rescue doses.  Recommendations  Followup echocardiogram which has already been ordered. She is feeling better with IV Lasix, may need to be placed on standing low-dose Lasix at home instead of HCTZ if diastolic dysfunction is evident. Cycle cardiac markers to exclude ACS. We will follow with you.   Jonelle Sidle, M.D., F.A.C.C.

## 2013-11-06 NOTE — Progress Notes (Signed)
*  PRELIMINARY RESULTS* Echocardiogram 2D Echocardiogram has been performed.  Hannan Tetzlaff 11/06/2013, 6:44 PM

## 2013-11-06 NOTE — Care Management Note (Addendum)
    Page 1 of 1   11/13/2013     9:25:13 AM   CARE MANAGEMENT NOTE 11/13/2013  Patient:  [name], [name]   Account Number:  [id number]  Date Initiated:  11/06/2013  Documentation initiated by:  Sharrie Rothman  Subjective/Objective Assessment:   Pt admitted from home with CHF and HTN. Pt lives alone and will return home at discharge. Pt is independent with ADL's and has a cane for home use.     Action/Plan:   No CM needs noted.   Anticipated DC Date:  11/08/2013   Anticipated DC Plan:  HOME/SELF CARE      DC Planning Services  CM consult      Choice offered to / List presented to:             Status of service:  Completed, signed off Medicare Important Message given?  YES (If response is "NO", the following Medicare IM given date fields will be blank) Date Medicare IM given:  11/13/2013 Date Additional Medicare IM given:    Discharge Disposition:  HOME/SELF CARE  Per UR Regulation:    If discussed at Long Length of Stay Meetings, dates discussed:    Comments:  11/13/13 0925 Arlyss Queen, RN BSN CM  11/06/13 1520 Arlyss Queen, RN BSN CM

## 2013-11-06 NOTE — Progress Notes (Signed)
Subjective: Patient was admitted yesterday due to shortness of breath. She is on IV diuretics. Patient feels much better to day  Objective: Vital signs in last 24 hours: Temp:  [97.7 F (36.5 C)-98.4 F (36.9 C)] 98.4 F (36.9 C) (12/30 0000) Pulse Rate:  [69-77] 72 (12/29 1427) Resp:  [16-25] 19 (12/30 0600) BP: (119-247)/(53-135) 149/80 mmHg (12/30 0600) SpO2:  [90 %-100 %] 90 % (12/29 1427) Weight:  [82.9 kg (182 lb 12.2 oz)-84.823 kg (187 lb)] 82.9 kg (182 lb 12.2 oz) (12/29 1427) Weight change:     Intake/Output from previous day: 12/29 0701 - 12/30 0700 In: 289.6 [P.O.:240; I.V.:49.6] Out: 2050 [Urine:2050]  PHYSICAL EXAM General appearance: alert and no distress Resp: diminished breath sounds bilaterally and rhonchi bilaterally Cardio: irregularly irregular rhythm GI: soft, non-tender; bowel sounds normal; no masses,  no organomegaly Extremities: edema 2++ LEG EDEMA  Lab Results:    @labtest @ [facility] No results found for this basename: PHART, PCO2, PO2ART, TCO2, HCO3,  in the last 72 hours CULTURES Recent Results (from the past 240 hour(s))  MRSA PCR SCREENING     Status: None   Collection Time    11/05/13  2:54 PM      Result Value Range Status   MRSA by PCR NEGATIVE  NEGATIVE Final   Comment:            The GeneXpert MRSA Assay (FDA     approved for NASAL specimens     only), is one component of a     comprehensive MRSA colonization     surveillance program. It is not     intended to diagnose MRSA     infection nor to guide or     monitor treatment for     MRSA infections.   Studies/Results: Dg Chest Port 1 View  11/05/2013   CLINICAL DATA:  Bronchitis, shortness of breath, COPD, hypertension, ETOH use  EXAM: PORTABLE CHEST - 1 VIEW  COMPARISON:  02/26/2013  FINDINGS: Stable cardiomegaly. Diffuse thickening of the interstitial markings. Areas of peribronchial cuffing. Indistinctness of the pulmonary vasculature. No focal region of consolidation or focal  infiltrates. The osseous structures unremarkable.  IMPRESSION: Interstitial infiltrate likely reflecting pulmonary edema mild focal regions of consolidation or focal infiltrates. Cardiomegaly.   Electronically Signed   By: Salome Holmes M.D.   On: 11/05/2013 09:22    Medications: I have reviewed the patient's current medications.  Assesment: . Pulmonary congestion R/O acute CHF  2. Hypertension- poorly controlled  3. COPD  4. Atrial fibrillation on anticoagulation  5. H/O PVD  6. Tobacco abuse  Active Problems:   CHF (congestive heart failure)    Plan: Medications reviewed. Continue iv diuretics Cardiology consult Echocardiogram    LOS: 1 day   Aurther Harlin 11/06/2013, 8:05 AM

## 2013-11-06 NOTE — Progress Notes (Signed)
UR chart review completed.  

## 2013-11-07 DIAGNOSIS — I319 Disease of pericardium, unspecified: Secondary | ICD-10-CM

## 2013-11-07 DIAGNOSIS — I313 Pericardial effusion (noninflammatory): Secondary | ICD-10-CM

## 2013-11-07 LAB — CBC
Hemoglobin: 12.2 g/dL (ref 12.0–15.0)
MCHC: 32.4 g/dL (ref 30.0–36.0)
Platelets: 214 10*3/uL (ref 150–400)
RBC: 4.27 MIL/uL (ref 3.87–5.11)
WBC: 8.7 10*3/uL (ref 4.0–10.5)

## 2013-11-07 LAB — BASIC METABOLIC PANEL
CO2: 30 mEq/L (ref 19–32)
GFR calc non Af Amer: 31 mL/min — ABNORMAL LOW (ref >90)
Glucose, Bld: 102 mg/dL — ABNORMAL HIGH (ref 70–99)
Potassium: 4.1 mEq/L (ref 3.7–5.3)
Sodium: 145 mEq/L (ref 137–147)

## 2013-11-07 MED ORDER — FUROSEMIDE 40 MG PO TABS
40.0000 mg | ORAL_TABLET | Freq: Every day | ORAL | Status: DC
  Administered 2013-11-07 – 2013-11-10 (×4): 40 mg via ORAL
  Filled 2013-11-07 (×4): qty 1

## 2013-11-07 MED ORDER — HYDRALAZINE HCL 10 MG PO TABS
20.0000 mg | ORAL_TABLET | Freq: Three times a day (TID) | ORAL | Status: DC
  Administered 2013-11-07 – 2013-11-08 (×3): 20 mg via ORAL
  Filled 2013-11-07 (×8): qty 2

## 2013-11-07 MED ORDER — LABETALOL HCL 200 MG PO TABS
400.0000 mg | ORAL_TABLET | Freq: Two times a day (BID) | ORAL | Status: DC
  Administered 2013-11-07 – 2013-11-08 (×4): 400 mg via ORAL
  Filled 2013-11-07 (×4): qty 2

## 2013-11-07 MED ORDER — LISINOPRIL 10 MG PO TABS
30.0000 mg | ORAL_TABLET | Freq: Every day | ORAL | Status: DC
  Administered 2013-11-07: 30 mg via ORAL
  Filled 2013-11-07 (×2): qty 3

## 2013-11-07 NOTE — Progress Notes (Signed)
    Received call from nursing regarding inability for patient to be weaned off nitroglycerin due to continued problems with hypertension (not chest pain). We discussed her medications. Labetalol and lisinopril were increased this morning. Decision made to initiate hydralazine 20 mg every 8 hours for now. Hopefully this will allow her to come off IV therapies. I asked that the primary service be informed as well.  Jonelle Sidle, M.D., F.A.C.C.

## 2013-11-07 NOTE — Progress Notes (Signed)
Subjective: Patient feels better. She is diuresing well. Her medications being adjusted by cardiology. Objective: Vital signs in last 24 hours: Temp:  [98 F (36.7 C)-98.2 F (36.8 C)] 98.2 F (36.8 C) (12/31 0000) Resp:  [11-32] 29 (12/31 0700) BP: (101-202)/(59-147) 182/88 mmHg (12/31 0700) Weight change:  Last BM Date: 11/05/13  Intake/Output from previous day: 12/30 0701 - 12/31 0700 In: 38.1 [I.V.:38.1] Out: 750 [Urine:750]  PHYSICAL EXAM General appearance: alert and no distress Resp: diminished breath sounds bilaterally and rhonchi bilaterally Cardio: irregularly irregular rhythm GI: soft, non-tender; bowel sounds normal; no masses,  no organomegaly Extremities: edema 2++ LEG EDEMA  Lab Results:    @labtest @ [facility] No results found for this basename: PHART, PCO2, PO2ART, TCO2, HCO3,  in the last 72 hours CULTURES Recent Results (from the past 240 hour(s))  MRSA PCR SCREENING     Status: None   Collection Time    11/05/13  2:54 PM      Result Value Range Status   MRSA by PCR NEGATIVE  NEGATIVE Final   Comment:            The GeneXpert MRSA Assay (FDA     approved for NASAL specimens     only), is one component of a     comprehensive MRSA colonization     surveillance program. It is not     intended to diagnose MRSA     infection nor to guide or     monitor treatment for     MRSA infections.   Studies/Results: Dg Chest Port 1 View  11/05/2013   CLINICAL DATA:  Bronchitis, shortness of breath, COPD, hypertension, ETOH use  EXAM: PORTABLE CHEST - 1 VIEW  COMPARISON:  02/26/2013  FINDINGS: Stable cardiomegaly. Diffuse thickening of the interstitial markings. Areas of peribronchial cuffing. Indistinctness of the pulmonary vasculature. No focal region of consolidation or focal infiltrates. The osseous structures unremarkable.  IMPRESSION: Interstitial infiltrate likely reflecting pulmonary edema mild focal regions of consolidation or focal infiltrates. Cardiomegaly.    Electronically Signed   By: Salome Holmes M.D.   On: 11/05/2013 09:22    Medications: I have reviewed the patient's current medications.  Assesment: . Pulmonary congestion R/O acute CHF  2. Hypertension- poorly controlled  3. COPD  4. Atrial fibrillation on anticoagulation  5. H/O PVD  6. Tobacco abuse  Active Problems:   Acute on chronic diastolic heart failure    Plan: Medications reviewed. Continue iv diuretics Cardiology consult appreciated Continue as per cardiology recommendation.   LOS: 2 days   Savien Mamula 11/07/2013, 7:53 AM

## 2013-11-07 NOTE — Progress Notes (Signed)
Primary cardiologist: Dr. York Ram  Consulting cardiologist: Dr. Jonelle Sidle  Subjective:    Feels better, lying supine. No chest pain or shortness of breath at rest. No palpitations.  Objective:   Temp:  [98 F (36.7 C)-98.2 F (36.8 C)] 98.2 F (36.8 C) (12/31 0000) Resp:  [11-32] 29 (12/31 0700) BP: (101-202)/(59-147) 182/88 mmHg (12/31 0700) Last BM Date: 11/05/13  Filed Weights   11/05/13 0810 11/05/13 1427  Weight: 187 lb (84.823 kg) 182 lb 12.2 oz (82.9 kg)    Intake/Output Summary (Last 24 hours) at 11/07/13 0742 Last data filed at 11/07/13 0200  Gross per 24 hour  Intake  38.14 ml  Output    750 ml  Net -711.86 ml    Telemetry: Atrial fibrillation with controlled ventricular response.  Exam:  General: No distress.  Lungs: Generally clear, nonlabored.  Cardiac: Irregularly irregular, no pericardial rub.  Abdomen: NABS.  Extremities: No pitting.  Lab Results:  Basic Metabolic Panel:  Recent Labs Lab 11/05/13 0851 11/06/13 0501 11/07/13 0447  NA 144 142 145  K 3.4* 3.7 4.1  CL 105 101 102  CO2 28 31 30   GLUCOSE 126* 100* 102*  BUN 27* 28* 36*  CREATININE 1.18* 1.49* 1.52*  CALCIUM 9.7 9.4 9.5    Liver Function Tests:  Recent Labs Lab 11/05/13 0851  AST 20  ALT 22  ALKPHOS 81  BILITOT 0.7  PROT 7.0  ALBUMIN 3.5    CBC:  Recent Labs Lab 11/05/13 0851 11/06/13 0501 11/07/13 0447  WBC 7.7 6.7 8.7  HGB 12.2 11.7* 12.2  HCT 39.0 37.3 37.7  MCV 89.4 89.4 88.3  PLT 217 202 214    Cardiac Enzymes:  Recent Labs Lab 11/05/13 1017  TROPONINI <0.30    BNP:  Recent Labs  02/26/13 0806 11/05/13 0851  PROBNP 2575.0* 2646.0*    Coagulation:  Recent Labs Lab 11/05/13 1134  INR 1.20    Echocardiogram: Study Conclusions  - Left ventricle: The cavity size was normal. Wall thickness was increased in a pattern of moderate LVH. Systolic function was vigorous. The estimated ejection fraction was in  the range of 75% to 80%. There is near obliteration of the mid cavity in systole with mild to moderate gradientof 30-40 mmHg. Wall motion was normal; there were no regional wall motion abnormalities. Features are consistent with a pseudonormal left ventricular filling pattern, with concomitant abnormal relaxation and increased filling pressure (grade 2 diastolic dysfunction). Doppler parameters are consistent with high ventricular filling pressure. - Aortic valve: Mildly calcified annulus. Trileaflet. No significant regurgitation. - Mitral valve: Calcified annulus. Mild regurgitation. - Left atrium: The atrium was severely dilated. - Right atrium: The atrium was moderately dilated. - Atrial septum: No defect or patent foramen ovale was identified. - Tricuspid valve: Mild regurgitation. - Pulmonary arteries: Systolic pressure was moderately increased. PA peak pressure: 58mm Hg (S). - Pericardium, extracardiac: A small to moderate pericardial effusion was identified circumferential to the heart. No definite tamponade physiology. Impressions:  - Comparison to prior study February 2014. Moderate LVH with LVEF 75-80%, mid cavitary gradient of 30-40 mmHg, grade 2 diastolic dysfunction. Severe left atrial and moderate right atrial enlargement. Mild mitral regurgitation. Mild tricuspid regurgitation with PASP 58 mmHg. Small to moderate, circumferential pericardial effusion without tamponade.   Medications:   Scheduled Medications: . apixaban  2.5 mg Oral BID  . aspirin EC  81 mg Oral q morning - 10a  . furosemide  40 mg Intravenous BID  .  labetalol  300 mg Oral BID  . lisinopril  20 mg Oral Daily  . mometasone-formoterol  2 puff Inhalation BID  . potassium chloride  40 mEq Oral BID  . predniSONE  60 mg Oral Q breakfast  . simvastatin  20 mg Oral QPM  . tiotropium  18 mcg Inhalation q morning - 10a     Infusions: . nitroGLYCERIN 10 mcg/min (11/06/13 1945)     PRN  Medications:  albuterol   Assessment:   1. Presentation with shortness of breath, likely multifactorial. Suspected component of acute on chronic diastolic heart failure, also probable contribution from pericardial effusion although no definite hemodynamic significance by echocardiogram. In addition, she has moderately elevated pulmonary pressures.  2. Vigorous LVEF 75-80% with mid cavitary gradient and elevated filling pressures.  3. Chronic atrial fibrillation, rate controlled, on Eliquis.  4. CAD and PAD as outlined previously. No clear ACS, no chest pain symptoms.  5. Hypertension, blood pressure not well controlled.   Plan/Discussion:    Need to wean off IV nitroglycerin. Plan to increase labetalol and cut back diuretic. With her hyperdynamic LV function and intracavitary gradient, need to avoid overdiuresis. Not entirely clear that she needs NSAIDs without active evidence of pericarditis. Can followup on pericardial effusion later to ensure this is progressing. Would aim to have moved out to telemetry when she is off nitroglycerin. Discussed with Dr. Felecia Shelling.   Jonelle Sidle, M.D., F.A.C.C.

## 2013-11-08 LAB — BASIC METABOLIC PANEL
BUN: 42 mg/dL — ABNORMAL HIGH (ref 6–23)
CO2: 28 mEq/L (ref 19–32)
Calcium: 9.5 mg/dL (ref 8.4–10.5)
Chloride: 102 mEq/L (ref 96–112)
Creatinine, Ser: 1.4 mg/dL — ABNORMAL HIGH (ref 0.50–1.10)
GFR calc Af Amer: 40 mL/min — ABNORMAL LOW (ref >90)
GFR calc non Af Amer: 34 mL/min — ABNORMAL LOW (ref >90)
Glucose, Bld: 112 mg/dL — ABNORMAL HIGH (ref 70–99)
Potassium: 4.6 mEq/L (ref 3.7–5.3)
Sodium: 142 mEq/L (ref 137–147)

## 2013-11-08 MED ORDER — LISINOPRIL 10 MG PO TABS
40.0000 mg | ORAL_TABLET | Freq: Every day | ORAL | Status: DC
  Administered 2013-11-08 – 2013-11-13 (×6): 40 mg via ORAL
  Filled 2013-11-08 (×6): qty 4

## 2013-11-08 MED ORDER — HYDRALAZINE HCL 25 MG PO TABS
50.0000 mg | ORAL_TABLET | Freq: Three times a day (TID) | ORAL | Status: DC
  Administered 2013-11-08 – 2013-11-13 (×15): 50 mg via ORAL
  Filled 2013-11-08 (×14): qty 2

## 2013-11-09 ENCOUNTER — Inpatient Hospital Stay (HOSPITAL_COMMUNITY): Payer: Medicare Other

## 2013-11-09 DIAGNOSIS — Z7901 Long term (current) use of anticoagulants: Secondary | ICD-10-CM

## 2013-11-09 MED ORDER — LORAZEPAM 2 MG/ML IJ SOLN
1.0000 mg | Freq: Once | INTRAMUSCULAR | Status: AC
  Administered 2013-11-09: 1 mg via INTRAVENOUS
  Filled 2013-11-09: qty 1

## 2013-11-09 MED ORDER — LABETALOL HCL 5 MG/ML IV SOLN
INTRAVENOUS | Status: AC
  Filled 2013-11-09: qty 20

## 2013-11-09 MED ORDER — LABETALOL HCL 5 MG/ML IV SOLN
0.5000 mg/min | INTRAVENOUS | Status: AC
  Administered 2013-11-09: 1 mg/min via INTRAVENOUS
  Administered 2013-11-09: 2 mg/min via INTRAVENOUS
  Administered 2013-11-09: 1.25 mg/min via INTRAVENOUS
  Administered 2013-11-10 (×2): 2 mg/min via INTRAVENOUS
  Filled 2013-11-09 (×4): qty 100

## 2013-11-09 MED ORDER — NICOTINE 21 MG/24HR TD PT24
21.0000 mg | MEDICATED_PATCH | Freq: Every day | TRANSDERMAL | Status: DC
  Administered 2013-11-09 – 2013-11-13 (×6): 21 mg via TRANSDERMAL
  Filled 2013-11-09 (×6): qty 1

## 2013-11-09 NOTE — Progress Notes (Signed)
Subjective: She has continued to have trouble with her blood pressure. She's been on nitroglycerin drip and I made a lot of changes in her blood pressure medications yesterday but it does not seem to have helped.  Objective: Vital signs in last 24 hours: Temp:  [97.9 F (36.6 C)-98.2 F (36.8 C)] 97.9 F (36.6 C) (01/02 0400) Pulse Rate:  [60-135] 68 (01/02 0700) Resp:  [13-28] 21 (01/02 0700) BP: (120-216)/(61-165) 190/97 mmHg (01/02 0700) SpO2:  [91 %-100 %] 97 % (01/02 0800) Weight:  [83.2 kg (183 lb 6.8 oz)] 83.2 kg (183 lb 6.8 oz) (01/02 0500) Weight change: 0.3 kg (10.6 oz) Last BM Date: 11/08/13  Intake/Output from previous day: 01/01 0701 - 01/02 0700 In: 915.1 [P.O.:600; I.V.:275.1] Out: 2400 [Urine:2400]  PHYSICAL EXAM General appearance: alert, cooperative and no distress Resp: clear to auscultation bilaterally Cardio: regular rate and rhythm, S1, S2 normal, no murmur, click, rub or gallop GI: soft, non-tender; bowel sounds normal; no masses,  no organomegaly Extremities: extremities normal, atraumatic, no cyanosis or edema  Lab Results:    Basic Metabolic Panel:  Recent Labs  54/09/81 0447 11/08/13 0508  NA 145 142  K 4.1 4.6  CL 102 102  CO2 30 28  GLUCOSE 102* 112*  BUN 36* 42*  CREATININE 1.52* 1.40*  CALCIUM 9.5 9.5   Liver Function Tests: No results found for this basename: AST, ALT, ALKPHOS, BILITOT, PROT, ALBUMIN,  in the last 72 hours No results found for this basename: LIPASE, AMYLASE,  in the last 72 hours No results found for this basename: AMMONIA,  in the last 72 hours CBC:  Recent Labs  11/07/13 0447  WBC 8.7  HGB 12.2  HCT 37.7  MCV 88.3  PLT 214   Cardiac Enzymes: No results found for this basename: CKTOTAL, CKMB, CKMBINDEX, TROPONINI,  in the last 72 hours BNP: No results found for this basename: PROBNP,  in the last 72 hours D-Dimer: No results found for this basename: DDIMER,  in the last 72 hours CBG: No results  found for this basename: GLUCAP,  in the last 72 hours Hemoglobin A1C: No results found for this basename: HGBA1C,  in the last 72 hours Fasting Lipid Panel: No results found for this basename: CHOL, HDL, LDLCALC, TRIG, CHOLHDL, LDLDIRECT,  in the last 72 hours Thyroid Function Tests: No results found for this basename: TSH, T4TOTAL, FREET4, T3FREE, THYROIDAB,  in the last 72 hours Anemia Panel: No results found for this basename: VITAMINB12, FOLATE, FERRITIN, TIBC, IRON, RETICCTPCT,  in the last 72 hours Coagulation: No results found for this basename: LABPROT, INR,  in the last 72 hours Urine Drug Screen: Drugs of Abuse     Component Value Date/Time   LABOPIA NONE DETECTED 01/10/2013 1154   COCAINSCRNUR NONE DETECTED 01/10/2013 1154   LABBENZ NONE DETECTED 01/10/2013 1154   AMPHETMU NONE DETECTED 01/10/2013 1154   THCU NONE DETECTED 01/10/2013 1154   LABBARB NONE DETECTED 01/10/2013 1154    Alcohol Level: No results found for this basename: ETH,  in the last 72 hours Urinalysis: No results found for this basename: COLORURINE, APPERANCEUR, LABSPEC, PHURINE, GLUCOSEU, HGBUR, BILIRUBINUR, KETONESUR, PROTEINUR, UROBILINOGEN, NITRITE, LEUKOCYTESUR,  in the last 72 hours Misc. Labs:  [facility] No results found for this basename: PHART, PCO2, PO2ART, TCO2, HCO3,  in the last 72 hours CULTURES Recent Results (from the past 240 hour(s))  MRSA PCR SCREENING     Status: None   Collection Time    11/05/13  2:54 PM      Result Value Range Status   MRSA by PCR NEGATIVE  NEGATIVE Final   Comment:            The GeneXpert MRSA Assay (FDA     approved for NASAL specimens     only), is one component of a     comprehensive MRSA colonization     surveillance program. It is not     intended to diagnose MRSA     infection nor to guide or     monitor treatment for     MRSA infections.   Studies/Results: No results found.  Medications:  Prior to Admission:  Prescriptions prior to admission  Medication  Sig Dispense Refill  . albuterol (PROVENTIL HFA;VENTOLIN HFA) 108 (90 BASE) MCG/ACT inhaler Inhale 2 puffs into the lungs every 6 (six) hours as needed for wheezing.  1 Inhaler  2  . apixaban (ELIQUIS) 2.5 MG TABS tablet Take 2.5 mg by mouth 2 (two) times daily.      Marland Kitchen. aspirin EC 81 MG tablet Take 81 mg by mouth every morning.       . Fluticasone Furoate-Vilanterol (BREO ELLIPTA) 100-25 MCG/INH AEPB Inhale 1 puff into the lungs daily.      Marland Kitchen. labetalol (NORMODYNE) 300 MG tablet Take 300 mg by mouth 2 (two) times daily.      . quinapril-hydrochlorothiazide (ACCURETIC) 20-25 MG per tablet Take 1 tablet by mouth daily.      . simvastatin (ZOCOR) 20 MG tablet Take 20 mg by mouth every evening.      . tiotropium (SPIRIVA) 18 MCG inhalation capsule Place 18 mcg into inhaler and inhale every morning.       Scheduled: . apixaban  2.5 mg Oral BID  . aspirin EC  81 mg Oral q morning - 10a  . furosemide  40 mg Oral Daily  . hydrALAZINE  50 mg Oral Q8H  . lisinopril  40 mg Oral Daily  . mometasone-formoterol  2 puff Inhalation BID  . nicotine  21 mg Transdermal Daily  . potassium chloride  40 mEq Oral BID  . predniSONE  60 mg Oral Q breakfast  . simvastatin  20 mg Oral QPM  . tiotropium  18 mcg Inhalation q morning - 10a   Continuous: . labetalol (NORMODYNE) infusion    . nitroGLYCERIN 90 mcg/min (11/09/13 0545)   ZOX:[id number]:albuterol  Assesment: She has acute on chronic diastolic heart failure. She has malignant hypertension. Her blood pressure is still uncontrolled. Active Problems:   Acute on chronic diastolic heart failure   Pericardial effusion    Plan: I'm going to try her on a labetalol drip. Her oral labetalol be discontinued.    LOS: 4 days   Sharyn Brilliant L 11/09/2013, 8:15 AM

## 2013-11-09 NOTE — Progress Notes (Signed)
NAME:  Shirleen SchirmerRMSTRONG, Elbia          ACCOUNT NO.:  [id number]  MEDICAL RECORD NO.:  [id number]  LOCATION:  [facility]                         FACILITY:  [facility]  PHYSICIAN:  Daine Gunther L. Juanetta GoslingHawkins, M.D.DATE OF BIRTH:  27-Jul-1931  DATE OF PROCEDURE: DATE OF DISCHARGE:                                PROGRESS NOTE   The patient of Dr. __________  HISTORY:  Mr. [name] was admitted with shortness of breath and it was felt that she has some element of congestive heart failure.  Her blood pressure has been very elevated.  She has been on nitroglycerin drip and she has not been able to be weaned from that yet.  This morning, she says that she feels okay.  She has no new complaints.  PHYSICAL EXAMINATION:  VITAL SIGNS:  Her exam showed that her blood pressure is still in the 180/100 range and sometimes higher than that. Her temperature is 97.6, pulse 77, respirations 17, blood pressure 180/124, and O2 sats 92%. GENERAL:  She looks relatively comfortable. HEENT:  Her pupils are reactive.  Nose and throat are clear. NECK:  Supple. CHEST:  Relatively clear now. HEART:  Her heart shows atrial fibrillation.  ASSESSMENT:  She has malignant hypertension and it has not been able to be controlled.  She is still on nitroglycerin drip and I am going to increase hydralazine, increase lisinopril, and see if we can get her off the nitroglycerin drip.  No other changes in medications at this point.     Luanna Weesner L. Juanetta GoslingHawkins, M.D.     ELH/MEDQ  D:  11/08/2013  T:  11/08/2013  Job:  [id number]

## 2013-11-09 NOTE — Progress Notes (Addendum)
Primary cardiologist: Dr. York RamJonathan Barry  Consulting cardiologist: Dr. Jonelle SidleSamuel G. Starlit Raburn  Subjective:    More somnolent today, confirmed by daughter in the room. She states she feels tired. No chest pain.  Objective:   Temp:  [97.9 F (36.6 C)-98.2 F (36.8 C)] 97.9 F (36.6 C) (01/02 0400) Pulse Rate:  [60-135] 77 (01/02 0930) Resp:  [13-28] 22 (01/02 0930) BP: (120-216)/(61-165) 158/85 mmHg (01/02 0930) SpO2:  [91 %-100 %] 97 % (01/02 0930) Weight:  [183 lb 6.8 oz (83.2 kg)] 183 lb 6.8 oz (83.2 kg) (01/02 0500) Last BM Date: 11/09/13  Filed Weights   11/05/13 1427 11/08/13 0500 11/09/13 0500  Weight: 182 lb 12.2 oz (82.9 kg) 182 lb 12.2 oz (82.9 kg) 183 lb 6.8 oz (83.2 kg)    Intake/Output Summary (Last 24 hours) at 11/09/13 1036 Last data filed at 11/09/13 0900  Gross per 24 hour  Intake 984.08 ml  Output   2901 ml  Net -1916.92 ml    Telemetry: Rate-controlled atrial fibrillation.  Exam:  General: Somnolent, but answers questions.  Lungs: Clear, nonlabored.  Cardiac: Irregularly irregular, no rub.  Abdomen: NABS.  Extremities: No pitting.   Lab Results:  Basic Metabolic Panel:  Recent Labs Lab 11/06/13 0501 11/07/13 0447 11/08/13 0508  NA 142 145 142  K 3.7 4.1 4.6  CL 101 102 102  CO2 31 30 28   GLUCOSE 100* 102* 112*  BUN 28* 36* 42*  CREATININE 1.49* 1.52* 1.40*  CALCIUM 9.4 9.5 9.5    Liver Function Tests:  Recent Labs Lab 11/05/13 0851  AST 20  ALT 22  ALKPHOS 81  BILITOT 0.7  PROT 7.0  ALBUMIN 3.5    CBC:  Recent Labs Lab 11/05/13 0851 11/06/13 0501 11/07/13 0447  WBC 7.7 6.7 8.7  HGB 12.2 11.7* 12.2  HCT 39.0 37.3 37.7  MCV 89.4 89.4 88.3  PLT 217 202 214    Cardiac Enzymes:  Recent Labs Lab 11/05/13 1017  TROPONINI <0.30    Echocardiogram: Study Conclusions  - Left ventricle: The cavity size was normal. Wall thickness was increased in a pattern of moderate LVH. Systolic function was vigorous.  The estimated ejection fraction was in the range of 75% to 80%. There is near obliteration of the mid cavity in systole with mild to moderate gradientof 30-40 mmHg. Wall motion was normal; there were no regional wall motion abnormalities. Features are consistent with a pseudonormal left ventricular filling pattern, with concomitant abnormal relaxation and increased filling pressure (grade 2 diastolic dysfunction). Doppler parameters are consistent with high ventricular filling pressure. - Aortic valve: Mildly calcified annulus. Trileaflet. No significant regurgitation. - Mitral valve: Calcified annulus. Mild regurgitation. - Left atrium: The atrium was severely dilated. - Right atrium: The atrium was moderately dilated. - Atrial septum: No defect or patent foramen ovale was identified. - Tricuspid valve: Mild regurgitation. - Pulmonary arteries: Systolic pressure was moderately increased. PA peak pressure: 58mm Hg (S). - Pericardium, extracardiac: A small to moderate pericardial effusion was identified circumferential to the heart. No definite tamponade physiology. Impressions:  - Comparison to prior study February 2014. Moderate LVH with LVEF 75-80%, mid cavitary gradient of 30-40 mmHg, grade 2 diastolic dysfunction. Severe left atrial and moderate right atrial enlargement. Mild mitral regurgitation. Mild tricuspid regurgitation with PASP 58 mmHg. Small to moderate, circumferential pericardial effusion without tamponade.   Medications:   Scheduled Medications: . apixaban  2.5 mg Oral BID  . aspirin EC  81 mg Oral q morning -  10a  . furosemide  40 mg Oral Daily  . hydrALAZINE  50 mg Oral Q8H  . lisinopril  40 mg Oral Daily  . mometasone-formoterol  2 puff Inhalation BID  . nicotine  21 mg Transdermal Daily  . potassium chloride  40 mEq Oral BID  . predniSONE  60 mg Oral Q breakfast  . simvastatin  20 mg Oral QPM  . tiotropium  18 mcg Inhalation q morning - 10a      Infusions: . labetalol (NORMODYNE) infusion 1 mg/min (11/09/13 0900)  . nitroGLYCERIN Stopped (11/09/13 0849)     PRN Medications:  albuterol   Assessment:   1. Presentation with shortness of breath, likely multifactorial. Suspected component of acute on chronic diastolic heart failure, also probable contribution from pericardial effusion although no definite hemodynamic significance by echocardiogram. In addition, she has moderately elevated pulmonary pressures.   2. Vigorous LVEF 75-80% with mid cavitary gradient and elevated filling pressures.   3. Chronic atrial fibrillation, rate controlled, on Eliquis.   4. Moderate, circumferential pericardial effusion without evidence of tamponade. Patient has been significantly hypertensive, not tachycardic or hypotensive. She will need a followup limited echocardiogram within the next few weeks to reassess effusion size, sooner depending on clinical status.  4. CAD and PAD as outlined previously. No clear ACS, no chest pain symptoms.   5. Hypertension, accelerated and difficult to control over baseline. Dr. Juanetta Gosling now following and has further advanced her antihypertensives including a switch to labetalol drip. Daughter states that patient seems more somnolent in last 24 hours, would question CNS event.   Plan/Discussion:    Both hydralazine and lisinopril recently increased. Labetalol can be further titrated to achieve blood pressure control. I spoke with nursing who will be contacting Dr. Juanetta Gosling about consideration of a head CT to exclude a CNS event.    Jonelle Sidle, M.D., F.A.C.C.

## 2013-11-09 NOTE — Progress Notes (Signed)
The patient's blood pressures gradually increased throughout the night averaging in 200's/110's.  Nitro drip increased by 5 then 10 increments to control the blood pressure which would sustain for a short time.  On-call MD informed with a new orders for Ativan 1mg  IV one-time dose, Nicotine patch q24hrs and scheduled AM BP meds given.  Continue with Nitro drip at this time until Cardiologist and Primary MD can assess her this morning.

## 2013-11-10 DIAGNOSIS — I119 Hypertensive heart disease without heart failure: Secondary | ICD-10-CM | POA: Diagnosis present

## 2013-11-10 MED ORDER — LABETALOL HCL 200 MG PO TABS
400.0000 mg | ORAL_TABLET | Freq: Four times a day (QID) | ORAL | Status: DC
  Administered 2013-11-10 – 2013-11-11 (×4): 400 mg via ORAL
  Filled 2013-11-10 (×4): qty 2

## 2013-11-10 NOTE — Progress Notes (Signed)
Subjective: She is more alert. Her blood pressure is much better controlled on intravenous labetalol. She had CT of the brain yesterday which did not show any acute event.  Objective: Vital signs in last 24 hours: Temp:  [97.5 F (36.4 C)] 97.5 F (36.4 C) (01/03 0802) Pulse Rate:  [59-132] 63 (01/03 0530) Resp:  [12-31] 21 (01/03 0930) BP: (65-197)/(38-159) 127/112 mmHg (01/03 0930) SpO2:  [95 %-100 %] 98 % (01/03 0822) Weight change:  Last BM Date: 11/09/13  Intake/Output from previous day: 01/02 0701 - 01/03 0700 In: 1719.7 [P.O.:1080; I.V.:639.7] Out: 1251 [Urine:1250; Stool:1]  PHYSICAL EXAM General appearance: alert, cooperative and mild distress Resp: clear to auscultation bilaterally Cardio: regular rate and rhythm, S1, S2 normal, no murmur, click, rub or gallop GI: soft, non-tender; bowel sounds normal; no masses,  no organomegaly Extremities: extremities normal, atraumatic, no cyanosis or edema  Lab Results:    Basic Metabolic Panel:  Recent Labs  40/98/11 0508  NA 142  K 4.6  CL 102  CO2 28  GLUCOSE 112*  BUN 42*  CREATININE 1.40*  CALCIUM 9.5   Liver Function Tests: No results found for this basename: AST, ALT, ALKPHOS, BILITOT, PROT, ALBUMIN,  in the last 72 hours No results found for this basename: LIPASE, AMYLASE,  in the last 72 hours No results found for this basename: AMMONIA,  in the last 72 hours CBC: No results found for this basename: WBC, NEUTROABS, HGB, HCT, MCV, PLT,  in the last 72 hours Cardiac Enzymes: No results found for this basename: CKTOTAL, CKMB, CKMBINDEX, TROPONINI,  in the last 72 hours BNP: No results found for this basename: PROBNP,  in the last 72 hours D-Dimer: No results found for this basename: DDIMER,  in the last 72 hours CBG: No results found for this basename: GLUCAP,  in the last 72 hours Hemoglobin A1C: No results found for this basename: HGBA1C,  in the last 72 hours Fasting Lipid Panel: No results found  for this basename: CHOL, HDL, LDLCALC, TRIG, CHOLHDL, LDLDIRECT,  in the last 72 hours Thyroid Function Tests: No results found for this basename: TSH, T4TOTAL, FREET4, T3FREE, THYROIDAB,  in the last 72 hours Anemia Panel: No results found for this basename: VITAMINB12, FOLATE, FERRITIN, TIBC, IRON, RETICCTPCT,  in the last 72 hours Coagulation: No results found for this basename: LABPROT, INR,  in the last 72 hours Urine Drug Screen: Drugs of Abuse     Component Value Date/Time   LABOPIA NONE DETECTED 01/10/2013 1154   COCAINSCRNUR NONE DETECTED 01/10/2013 1154   LABBENZ NONE DETECTED 01/10/2013 1154   AMPHETMU NONE DETECTED 01/10/2013 1154   THCU NONE DETECTED 01/10/2013 1154   LABBARB NONE DETECTED 01/10/2013 1154    Alcohol Level: No results found for this basename: ETH,  in the last 72 hours Urinalysis: No results found for this basename: COLORURINE, APPERANCEUR, LABSPEC, PHURINE, GLUCOSEU, HGBUR, BILIRUBINUR, KETONESUR, PROTEINUR, UROBILINOGEN, NITRITE, LEUKOCYTESUR,  in the last 72 hours Misc. Labs:  ABGS No results found for this basename: PHART, PCO2, PO2ART, TCO2, HCO3,  in the last 72 hours CULTURES Recent Results (from the past 240 hour(s))  MRSA PCR SCREENING     Status: None   Collection Time    11/05/13  2:54 PM      Result Value Range Status   MRSA by PCR NEGATIVE  NEGATIVE Final   Comment:            The GeneXpert MRSA Assay (FDA     approved  for NASAL specimens     only), is one component of a     comprehensive MRSA colonization     surveillance program. It is not     intended to diagnose MRSA     infection nor to guide or     monitor treatment for     MRSA infections.   Studies/Results: Ct Head Wo Contrast  11/09/2013   CLINICAL DATA:  80 year old female with hypertension and altered mental status. Initial encounter. Remote history of meningioma resection.  EXAM: CT HEAD WITHOUT CONTRAST  TECHNIQUE: Contiguous axial images were obtained from the base of the skull  through the vertex without intravenous contrast.  COMPARISON:  03/17/2013 and earlier.  FINDINGS: Stable visualized osseous structures. Sequelae of frontal craniotomy. Improved pneumatization of the visualized paranasal sinuses. Mastoids remain clear. No acute orbit or scalp soft tissue findings.  Calcified atherosclerosis at the skull base. Stable anterior frontal lobe encephalomalacia and ex vacuo enlargement of the frontal horns. Suggestion of superimposed chronic lacunar infarct affecting the right basal ganglia. Stable cerebral volume. No ventriculomegaly. No midline shift, mass effect, or evidence of intracranial mass lesion. No acute intracranial hemorrhage identified. No evidence of cortically based acute infarction identified. Mild for age patchy white matter hypodensity elsewhere. No suspicious intracranial vascular hyperdensity.  IMPRESSION: No acute intracranial abnormality. Stable chronic and postoperative changes.   Electronically Signed   By: Augusto GambleLee  Hall M.D.   On: 11/09/2013 11:34    Medications:  Prior to Admission:  Prescriptions prior to admission  Medication Sig Dispense Refill  . albuterol (PROVENTIL HFA;VENTOLIN HFA) 108 (90 BASE) MCG/ACT inhaler Inhale 2 puffs into the lungs every 6 (six) hours as needed for wheezing.  1 Inhaler  2  . apixaban (ELIQUIS) 2.5 MG TABS tablet Take 2.5 mg by mouth 2 (two) times daily.      Marland Kitchen. aspirin EC 81 MG tablet Take 81 mg by mouth every morning.       . Fluticasone Furoate-Vilanterol (BREO ELLIPTA) 100-25 MCG/INH AEPB Inhale 1 puff into the lungs daily.      Marland Kitchen. labetalol (NORMODYNE) 300 MG tablet Take 300 mg by mouth 2 (two) times daily.      . quinapril-hydrochlorothiazide (ACCURETIC) 20-25 MG per tablet Take 1 tablet by mouth daily.      . simvastatin (ZOCOR) 20 MG tablet Take 20 mg by mouth every evening.      . tiotropium (SPIRIVA) 18 MCG inhalation capsule Place 18 mcg into inhaler and inhale every morning.       Scheduled: . apixaban  2.5 mg  Oral BID  . aspirin EC  81 mg Oral q morning - 10a  . furosemide  40 mg Oral Daily  . hydrALAZINE  50 mg Oral Q8H  . lisinopril  40 mg Oral Daily  . mometasone-formoterol  2 puff Inhalation BID  . nicotine  21 mg Transdermal Daily  . potassium chloride  40 mEq Oral BID  . predniSONE  60 mg Oral Q breakfast  . simvastatin  20 mg Oral QPM  . tiotropium  18 mcg Inhalation q morning - 10a   Continuous: . labetalol (NORMODYNE) infusion 2 mg/min (11/10/13 0900)  . nitroGLYCERIN Stopped (11/09/13 0849)   WUJ:[id number]:albuterol  Assesment: She was admitted with heart failure. She has had malignant hypertension which has been very difficult to control. She's much better on a labetalol drip now. She is off nitroglycerin. Active Problems:   Acute on chronic diastolic heart failure   Pericardial effusion  Plan: I will plan to convert her to by mouth labetalol.    LOS: 5 days   Bryauna Byrum L 11/10/2013, 10:28 AM

## 2013-11-11 LAB — CBC WITH DIFFERENTIAL/PLATELET
Basophils Absolute: 0 10*3/uL (ref 0.0–0.1)
Basophils Relative: 0 % (ref 0–1)
Eosinophils Absolute: 0 10*3/uL (ref 0.0–0.7)
Eosinophils Relative: 0 % (ref 0–5)
HCT: 38.5 % (ref 36.0–46.0)
Hemoglobin: 12.4 g/dL (ref 12.0–15.0)
Lymphocytes Relative: 16 % (ref 12–46)
Lymphs Abs: 1.7 10*3/uL (ref 0.7–4.0)
MCH: 28.4 pg (ref 26.0–34.0)
MCHC: 32.2 g/dL (ref 30.0–36.0)
MCV: 88.1 fL (ref 78.0–100.0)
Monocytes Absolute: 0.8 10*3/uL (ref 0.1–1.0)
Monocytes Relative: 8 % (ref 3–12)
NEUTROS PCT: 76 % (ref 43–77)
Neutro Abs: 7.7 10*3/uL (ref 1.7–7.7)
Platelets: 246 10*3/uL (ref 150–400)
RBC: 4.37 MIL/uL (ref 3.87–5.11)
RDW: 13.7 % (ref 11.5–15.5)
WBC: 10.2 10*3/uL (ref 4.0–10.5)

## 2013-11-11 LAB — BASIC METABOLIC PANEL
BUN: 45 mg/dL — ABNORMAL HIGH (ref 6–23)
CO2: 28 mEq/L (ref 19–32)
Calcium: 9.6 mg/dL (ref 8.4–10.5)
Chloride: 103 mEq/L (ref 96–112)
Creatinine, Ser: 1.52 mg/dL — ABNORMAL HIGH (ref 0.50–1.10)
GFR calc Af Amer: 36 mL/min — ABNORMAL LOW (ref >90)
GFR calc non Af Amer: 31 mL/min — ABNORMAL LOW (ref >90)
GLUCOSE: 95 mg/dL (ref 70–99)
Potassium: 4.8 mEq/L (ref 3.7–5.3)
Sodium: 141 mEq/L (ref 137–147)

## 2013-11-11 MED ORDER — LABETALOL HCL 200 MG PO TABS: 600.0000 mg | ORAL_TABLET | Freq: Four times a day (QID) | ORAL | Status: DC

## 2013-11-11 MED ORDER — LABETALOL HCL 200 MG PO TABS
400.0000 mg | ORAL_TABLET | Freq: Four times a day (QID) | ORAL | Status: DC
  Administered 2013-11-11 (×3): 400 mg via ORAL
  Filled 2013-11-11 (×3): qty 2

## 2013-11-11 MED ORDER — FUROSEMIDE 80 MG PO TABS
80.0000 mg | ORAL_TABLET | Freq: Every day | ORAL | Status: DC
  Administered 2013-11-11 – 2013-11-13 (×3): 80 mg via ORAL
  Filled 2013-11-11 (×3): qty 1

## 2013-11-11 NOTE — Progress Notes (Signed)
Subjective: She says she feels better but had trouble with shortness of breath last night. He has no other new complaints. Her blood pressure has been better controlled on increased dose of oral labetalol.   Objective: Vital signs in last 24 hours: Temp:  [97.6 F (36.4 C)] 97.6 F (36.4 C) (01/03 1200) Pulse Rate:  [64] 64 (01/03 1130) Resp:  [17-26] 19 (01/04 0600) BP: (114-207)/(64-144) 190/83 mmHg (01/04 0600) SpO2:  [95 %-99 %] 95 % (01/03 2057) Weight change:  Last BM Date: 11/10/13  Intake/Output from previous day: 01/03 0701 - 01/04 0700 In: 1305 [P.O.:1200; I.V.:105] Out: 1600 [Urine:1600]  PHYSICAL EXAM General appearance: alert, cooperative and mild distress Resp: rales bibasilar Cardio: irregularly irregular rhythm GI: soft, non-tender; bowel sounds normal; no masses,  no organomegaly Extremities: extremities normal, atraumatic, no cyanosis or edema  Lab Results:    Basic Metabolic Panel:  Recent Labs  16/10/96 0438  NA 141  K 4.8  CL 103  CO2 28  GLUCOSE 95  BUN 45*  CREATININE 1.52*  CALCIUM 9.6   Liver Function Tests: No results found for this basename: AST, ALT, ALKPHOS, BILITOT, PROT, ALBUMIN,  in the last 72 hours No results found for this basename: LIPASE, AMYLASE,  in the last 72 hours No results found for this basename: AMMONIA,  in the last 72 hours CBC:  Recent Labs  11/11/13 0438  WBC 10.2  NEUTROABS 7.7  HGB 12.4  HCT 38.5  MCV 88.1  PLT 246   Cardiac Enzymes: No results found for this basename: CKTOTAL, CKMB, CKMBINDEX, TROPONINI,  in the last 72 hours BNP: No results found for this basename: PROBNP,  in the last 72 hours D-Dimer: No results found for this basename: DDIMER,  in the last 72 hours CBG: No results found for this basename: GLUCAP,  in the last 72 hours Hemoglobin A1C: No results found for this basename: HGBA1C,  in the last 72 hours Fasting Lipid Panel: No results found for this basename: CHOL, HDL,  LDLCALC, TRIG, CHOLHDL, LDLDIRECT,  in the last 72 hours Thyroid Function Tests: No results found for this basename: TSH, T4TOTAL, FREET4, T3FREE, THYROIDAB,  in the last 72 hours Anemia Panel: No results found for this basename: VITAMINB12, FOLATE, FERRITIN, TIBC, IRON, RETICCTPCT,  in the last 72 hours Coagulation: No results found for this basename: LABPROT, INR,  in the last 72 hours Urine Drug Screen: Drugs of Abuse     Component Value Date/Time   LABOPIA NONE DETECTED 01/10/2013 1154   COCAINSCRNUR NONE DETECTED 01/10/2013 1154   LABBENZ NONE DETECTED 01/10/2013 1154   AMPHETMU NONE DETECTED 01/10/2013 1154   THCU NONE DETECTED 01/10/2013 1154   LABBARB NONE DETECTED 01/10/2013 1154    Alcohol Level: No results found for this basename: ETH,  in the last 72 hours Urinalysis: No results found for this basename: COLORURINE, APPERANCEUR, LABSPEC, PHURINE, GLUCOSEU, HGBUR, BILIRUBINUR, KETONESUR, PROTEINUR, UROBILINOGEN, NITRITE, LEUKOCYTESUR,  in the last 72 hours Misc. Labs:  ABGS No results found for this basename: PHART, PCO2, PO2ART, TCO2, HCO3,  in the last 72 hours CULTURES Recent Results (from the past 240 hour(s))  MRSA PCR SCREENING     Status: None   Collection Time    11/05/13  2:54 PM      Result Value Range Status   MRSA by PCR NEGATIVE  NEGATIVE Final   Comment:            The GeneXpert MRSA Assay (FDA  approved for NASAL specimens     only), is one component of a     comprehensive MRSA colonization     surveillance program. It is not     intended to diagnose MRSA     infection nor to guide or     monitor treatment for     MRSA infections.   Studies/Results: Ct Head Wo Contrast  11/09/2013   CLINICAL DATA:  80 year old female with hypertension and altered mental status. Initial encounter. Remote history of meningioma resection.  EXAM: CT HEAD WITHOUT CONTRAST  TECHNIQUE: Contiguous axial images were obtained from the base of the skull through the vertex without  intravenous contrast.  COMPARISON:  03/17/2013 and earlier.  FINDINGS: Stable visualized osseous structures. Sequelae of frontal craniotomy. Improved pneumatization of the visualized paranasal sinuses. Mastoids remain clear. No acute orbit or scalp soft tissue findings.  Calcified atherosclerosis at the skull base. Stable anterior frontal lobe encephalomalacia and ex vacuo enlargement of the frontal horns. Suggestion of superimposed chronic lacunar infarct affecting the right basal ganglia. Stable cerebral volume. No ventriculomegaly. No midline shift, mass effect, or evidence of intracranial mass lesion. No acute intracranial hemorrhage identified. No evidence of cortically based acute infarction identified. Mild for age patchy white matter hypodensity elsewhere. No suspicious intracranial vascular hyperdensity.  IMPRESSION: No acute intracranial abnormality. Stable chronic and postoperative changes.   Electronically Signed   By: Augusto GambleLee  Hall M.D.   On: 11/09/2013 11:34    Medications:  Prior to Admission:  Prescriptions prior to admission  Medication Sig Dispense Refill  . albuterol (PROVENTIL HFA;VENTOLIN HFA) 108 (90 BASE) MCG/ACT inhaler Inhale 2 puffs into the lungs every 6 (six) hours as needed for wheezing.  1 Inhaler  2  . apixaban (ELIQUIS) 2.5 MG TABS tablet Take 2.5 mg by mouth 2 (two) times daily.      Marland Kitchen. aspirin EC 81 MG tablet Take 81 mg by mouth every morning.       . Fluticasone Furoate-Vilanterol (BREO ELLIPTA) 100-25 MCG/INH AEPB Inhale 1 puff into the lungs daily.      Marland Kitchen. labetalol (NORMODYNE) 300 MG tablet Take 300 mg by mouth 2 (two) times daily.      . quinapril-hydrochlorothiazide (ACCURETIC) 20-25 MG per tablet Take 1 tablet by mouth daily.      . simvastatin (ZOCOR) 20 MG tablet Take 20 mg by mouth every evening.      . tiotropium (SPIRIVA) 18 MCG inhalation capsule Place 18 mcg into inhaler and inhale every morning.       Scheduled: . apixaban  2.5 mg Oral BID  . aspirin EC   81 mg Oral q morning - 10a  . furosemide  40 mg Oral Daily  . hydrALAZINE  50 mg Oral Q8H  . labetalol  400 mg Oral Q6H  . lisinopril  40 mg Oral Daily  . mometasone-formoterol  2 puff Inhalation BID  . nicotine  21 mg Transdermal Daily  . potassium chloride  40 mEq Oral BID  . predniSONE  60 mg Oral Q breakfast  . simvastatin  20 mg Oral QPM  . tiotropium  18 mcg Inhalation q morning - 10a   Continuous: . nitroGLYCERIN Stopped (11/09/13 0849)   ZOX:[id number]:albuterol  Assesment: She has malignant hypertension which is better controlled. She had shortness of breath last night. She does have a component of COPD and congestive heart failure. She has chronic atrial fibrillation which appears to be stable. She has some rales in the bases and although  she is net negative with her intake and output since admission she has not had much diuresis in the last few days. Active Problems:   Permanent atrial fibrillation   HTN (hypertension)   CVA (cerebral infarction)   COPD (chronic obstructive pulmonary disease)   Acute on chronic diastolic heart failure   Pericardial effusion   Malignant hypertensive heart disease    Plan: I'm going to increase the labetalol slightly and I will give her more Lasix. Overall though I think she's better    LOS: 6 days   Marguis Mathieson L 11/11/2013, 9:55 AM

## 2013-11-12 MED ORDER — PREDNISONE 20 MG PO TABS
40.0000 mg | ORAL_TABLET | Freq: Every day | ORAL | Status: DC
  Administered 2013-11-12 – 2013-11-13 (×2): 40 mg via ORAL
  Filled 2013-11-12 (×2): qty 2

## 2013-11-12 MED ORDER — LABETALOL HCL 200 MG PO TABS
600.0000 mg | ORAL_TABLET | Freq: Four times a day (QID) | ORAL | Status: DC
  Administered 2013-11-12 – 2013-11-13 (×5): 600 mg via ORAL
  Filled 2013-11-12 (×5): qty 3

## 2013-11-12 NOTE — Progress Notes (Signed)
Nutrition Brief Note  RD pulled to chart due to LOS.  Wt Readings from Last 15 Encounters:  11/09/13 183 lb 6.8 oz (83.2 kg)  09/19/13 187 lb (84.823 kg)  04/05/13 185 lb (83.915 kg)  03/16/13 188 lb (85.276 kg)  03/09/13 188 lb (85.276 kg)  02/28/13 220 lb 10.9 oz (100.1 kg)  01/10/13 185 lb (83.915 kg)  01/06/13 185 lb 13.6 oz (84.3 kg)    Body mass index is 28.29 kg/(m^2). Patient meets criteria for overweight based on current BMI.   Current diet order is low sodium, patient is consuming approximately 90-100% of meals at this time. Labs and medications reviewed.   No nutrition interventions warranted at this time. If nutrition issues arise, please consult RD.   Kaesha Kirsch A. Mayford KnifeWilliams, RD, LDN Pager: [phone]

## 2013-11-12 NOTE — Progress Notes (Signed)
Patient ID: [name], female   DOB: 18-Apr-1931, 80 y.o.   MRN: [id number]     Subjective:   Patient reports breathing has improved   Objective:   Temp:  [98.2 F (36.8 C)] 98.2 F (36.8 C) (01/04 1600) Pulse Rate:  [70-71] 71 (01/05 0532) Resp:  [14-26] 20 (01/05 0400) BP: (124-222)/(63-124) 190/77 mmHg (01/05 0539) SpO2:  [94 %] 94 % (01/04 1146) Last BM Date: 11/10/13  Filed Weights   11/05/13 1427 11/08/13 0500 11/09/13 0500  Weight: 182 lb 12.2 oz (82.9 kg) 182 lb 12.2 oz (82.9 kg) 183 lb 6.8 oz (83.2 kg)    Intake/Output Summary (Last 24 hours) at 11/12/13 0837 Last data filed at 11/12/13 0400  Gross per 24 hour  Intake      0 ml  Output   1200 ml  Net  -1200 ml    Telemetry:afib  Exam:  General:NAD  Resp:CTAB  Cardiac: irreg, no m/r/g, no JVD, no carotid bruits  NW:GNFA, NT, ND  MSK: extremities are warm, no edema  Neuro: no focal defciits    Lab Results:  Basic Metabolic Panel:  Recent Labs Lab 11/07/13 0447 11/08/13 0508 11/11/13 0438  NA 145 142 141  K 4.1 4.6 4.8  CL 102 102 103  CO2 30 28 28   GLUCOSE 102* 112* 95  BUN 36* 42* 45*  CREATININE 1.52* 1.40* 1.52*  CALCIUM 9.5 9.5 9.6    Liver Function Tests:  Recent Labs Lab 11/05/13 0851  AST 20  ALT 22  ALKPHOS 81  BILITOT 0.7  PROT 7.0  ALBUMIN 3.5    CBC:  Recent Labs Lab 11/06/13 0501 11/07/13 0447 11/11/13 0438  WBC 6.7 8.7 10.2  HGB 11.7* 12.2 12.4  HCT 37.3 37.7 38.5  MCV 89.4 88.3 88.1  PLT 202 214 246    Cardiac Enzymes:  Recent Labs Lab 11/05/13 1017  TROPONINI <0.30    BNP:  Recent Labs  02/26/13 0806 11/05/13 0851  PROBNP 2575.0* 2646.0*    Coagulation:  Recent Labs Lab 11/05/13 1134  INR 1.20    ECG:   Medications:   Scheduled Medications: . apixaban  2.5 mg Oral BID  . aspirin EC  81 mg Oral q morning - 10a  . furosemide  80 mg Oral Daily  . hydrALAZINE  50 mg Oral Q8H  . labetalol  600 mg Oral Q6H  .  lisinopril  40 mg Oral Daily  . mometasone-formoterol  2 puff Inhalation BID  . nicotine  21 mg Transdermal Daily  . potassium chloride  40 mEq Oral BID  . predniSONE  40 mg Oral Q breakfast  . simvastatin  20 mg Oral QPM  . tiotropium  18 mcg Inhalation q morning - 10a     Infusions: . nitroGLYCERIN Stopped (11/09/13 0849)     PRN Medications:  albuterol     Assessment/Plan    1. Acute on chronic diastolic heart failure - net negative 1.2 liters yesterday, total net negative 4.8 liters since admission. Cr with some variability but overall stable over the last few days.  - currently on oral lasix 80mg  daily, continue current regiment. She appears near euvolemic, continue maintence oral dosing.   2. Chronic atrial fibrillation, rate controlled, on Eliquis.   3. Moderate, circumferential pericardial effusion without evidence of tamponade. Patient has been significantly hypertensive, not tachycardic or hypotensive. She will need a followup limited echocardiogram within the next few weeks to reassess effusion size, sooner depending on clinical status.  4. Hypertension, accelerated and difficult to control over baseline. - elevated blood pressures, she received her first dose of labetalol 600mg  this morning - room to titrate hydralzine further, can go up to 100mg  tid this afternoon if bp not improved with increased labetalol. Would also consider starting spironolactone 12.5mg  daily in the setting of resistant hypertension.        Dina RichJonathan Ismelda Weatherman, M.D., F.A.C.C.

## 2013-11-12 NOTE — Progress Notes (Signed)
Subjective: Patient is resting. Her breathing is better. Her blood pressure continue to run high. Her medications being adjusted. No headache or chest pain. Objective: Vital signs in last 24 hours: Temp:  [98.2 F (36.8 C)] 98.2 F (36.8 C) (01/04 1600) Pulse Rate:  [70-71] 71 (01/05 0532) Resp:  [14-26] 20 (01/05 0400) BP: (124-222)/(63-124) 190/77 mmHg (01/05 0539) SpO2:  [94 %] 94 % (01/04 1146) Weight change:  Last BM Date: 11/10/13  Intake/Output from previous day: 01/04 0701 - 01/05 0700 In: -  Out: 1200 [Urine:1200]  PHYSICAL EXAM General appearance: alert and no distress Resp: diminished breath sounds bilaterally and rhonchi bilaterally Cardio: irregularly irregular rhythm GI: soft, non-tender; bowel sounds normal; no masses,  no organomegaly Extremities: edema 2++ LEG EDEMA  Lab Results:    @labtest @ ABGS No results found for this basename: PHART, PCO2, PO2ART, TCO2, HCO3,  in the last 72 hours CULTURES Recent Results (from the past 240 hour(s))  MRSA PCR SCREENING     Status: None   Collection Time    11/05/13  2:54 PM      Result Value Range Status   MRSA by PCR NEGATIVE  NEGATIVE Final   Comment:            The GeneXpert MRSA Assay (FDA     approved for NASAL specimens     only), is one component of a     comprehensive MRSA colonization     surveillance program. It is not     intended to diagnose MRSA     infection nor to guide or     monitor treatment for     MRSA infections.   Studies/Results: No results found.  Medications: I have reviewed the patient's current medications.  Assesment: . Pulmonary congestion R/O acute CHF  2. Hypertension- poorly controlled  3. COPD  4. Atrial fibrillation on anticoagulation  5. H/O PVD  6. Tobacco abuse  Active Problems:   Permanent atrial fibrillation   HTN (hypertension)   CVA (cerebral infarction)   COPD (chronic obstructive pulmonary disease)   Acute on chronic diastolic heart failure  Pericardial effusion   Malignant hypertensive heart disease    Plan: Medications reviewed. Will continue current antihypertensives  Will continue to adjust the medication.   LOS: 7 days   Maegan Buller 11/12/2013, 7:40 AM

## 2013-11-13 MED ORDER — POTASSIUM CHLORIDE CRYS ER 20 MEQ PO TBCR: 40.0000 meq | EXTENDED_RELEASE_TABLET | Freq: Two times a day (BID) | ORAL | Status: AC

## 2013-11-13 MED ORDER — PREDNISONE 10 MG PO TABS: ORAL_TABLET | ORAL | Status: DC

## 2013-11-13 MED ORDER — HYDRALAZINE HCL 50 MG PO TABS: 75.0000 mg | ORAL_TABLET | Freq: Three times a day (TID) | ORAL | Status: DC

## 2013-11-13 MED ORDER — FUROSEMIDE 80 MG PO TABS: 80.0000 mg | ORAL_TABLET | Freq: Every day | ORAL | Status: DC

## 2013-11-13 MED ORDER — LABETALOL HCL 300 MG PO TABS: 600.0000 mg | ORAL_TABLET | Freq: Three times a day (TID) | ORAL | Status: DC

## 2013-11-13 MED ORDER — NICOTINE 21 MG/24HR TD PT24: 21.0000 mg | MEDICATED_PATCH | Freq: Every day | TRANSDERMAL | Status: DC

## 2013-11-13 MED ORDER — LISINOPRIL 40 MG PO TABS: 40.0000 mg | ORAL_TABLET | Freq: Every day | ORAL | Status: AC

## 2013-11-13 NOTE — Discharge Summary (Signed)
Physician Discharge Summary  Patient ID: [name] MRN: [id number] DOB/AGE: 78-Oct-1932 78 y.o. Primary Care Physician:Gaige Sebo, MD Admit date: 11/05/2013 Discharge date: 11/13/2013    Discharge Diagnoses:   Active Problems:   Permanent atrial fibrillation   HTN (hypertension)   CVA (cerebral infarction)   COPD (chronic obstructive pulmonary disease)   Acute on chronic diastolic heart failure   Pericardial effusion   Malignant hypertensive heart disease     Medication List    STOP taking these medications       quinapril-hydrochlorothiazide 20-25 MG per tablet  Commonly known as:  ACCURETIC      TAKE these medications       albuterol 108 (90 BASE) MCG/ACT inhaler  Commonly known as:  PROVENTIL HFA;VENTOLIN HFA  Inhale 2 puffs into the lungs every 6 (six) hours as needed for wheezing.     aspirin EC 81 MG tablet  Take 81 mg by mouth every morning.     BREO ELLIPTA 100-25 MCG/INH Aepb  Generic drug:  Fluticasone Furoate-Vilanterol  Inhale 1 puff into the lungs daily.     ELIQUIS 2.5 MG Tabs tablet  Generic drug:  apixaban  Take 2.5 mg by mouth 2 (two) times daily.     furosemide 80 MG tablet  Commonly known as:  LASIX  Take 1 tablet (80 mg total) by mouth daily.     hydrALAZINE 50 MG tablet  Commonly known as:  APRESOLINE  Take 1.5 tablets (75 mg total) by mouth 3 (three) times daily.     labetalol 300 MG tablet  Commonly known as:  NORMODYNE  Take 2 tablets (600 mg total) by mouth 3 (three) times daily.     lisinopril 40 MG tablet  Commonly known as:  PRINIVIL,ZESTRIL  Take 1 tablet (40 mg total) by mouth daily.     nicotine 21 mg/24hr patch  Commonly known as:  NICODERM CQ - dosed in mg/24 hours  Place 1 patch (21 mg total) onto the skin daily.     potassium chloride SA 20 MEQ tablet  Commonly known as:  K-DUR,KLOR-CON  Take 2 tablets (40 mEq total) by mouth 2 (two) times daily.     predniSONE 10 MG tablet  Commonly known as:   DELTASONE  30 mg po daily foer 3 days, then 20 mg po day for 3 days then 10 mg po daily then d/c     simvastatin 20 MG tablet  Commonly known as:  ZOCOR  Take 20 mg by mouth every evening.     tiotropium 18 MCG inhalation capsule  Commonly known as:  SPIRIVA  Place 18 mcg into inhaler and inhale every morning.        Discharged Condition: improved    Consults: cardiology  Significant Diagnostic Studies: Ct Head Wo Contrast  11/09/2013   CLINICAL DATA:  80 year old female with hypertension and altered mental status. Initial encounter. Remote history of meningioma resection.  EXAM: CT HEAD WITHOUT CONTRAST  TECHNIQUE: Contiguous axial images were obtained from the base of the skull through the vertex without intravenous contrast.  COMPARISON:  03/17/2013 and earlier.  FINDINGS: Stable visualized osseous structures. Sequelae of frontal craniotomy. Improved pneumatization of the visualized paranasal sinuses. Mastoids remain clear. No acute orbit or scalp soft tissue findings.  Calcified atherosclerosis at the skull base. Stable anterior frontal lobe encephalomalacia and ex vacuo enlargement of the frontal horns. Suggestion of superimposed chronic lacunar infarct affecting the right basal ganglia. Stable cerebral volume. No ventriculomegaly. No midline shift,  mass effect, or evidence of intracranial mass lesion. No acute intracranial hemorrhage identified. No evidence of cortically based acute infarction identified. Mild for age patchy white matter hypodensity elsewhere. No suspicious intracranial vascular hyperdensity.  IMPRESSION: No acute intracranial abnormality. Stable chronic and postoperative changes.   Electronically Signed   By: Augusto GambleLee  Hall M.D.   On: 11/09/2013 11:34   Dg Chest Port 1 View  11/05/2013   CLINICAL DATA:  Bronchitis, shortness of breath, COPD, hypertension, ETOH use  EXAM: PORTABLE CHEST - 1 VIEW  COMPARISON:  02/26/2013  FINDINGS: Stable cardiomegaly. Diffuse thickening of  the interstitial markings. Areas of peribronchial cuffing. Indistinctness of the pulmonary vasculature. No focal region of consolidation or focal infiltrates. The osseous structures unremarkable.  IMPRESSION: Interstitial infiltrate likely reflecting pulmonary edema mild focal regions of consolidation or focal infiltrates. Cardiomegaly.   Electronically Signed   By: Salome HolmesHector  Cooper M.D.   On: 11/05/2013 09:22    Lab Results: Basic Metabolic Panel:  Recent Labs  16/08/9600/04/15 0438  NA 141  K 4.8  CL 103  CO2 28  GLUCOSE 95  BUN 45*  CREATININE 1.52*  CALCIUM 9.6   Liver Function Tests: No results found for this basename: AST, ALT, ALKPHOS, BILITOT, PROT, ALBUMIN,  in the last 72 hours   CBC:  Recent Labs  11/11/13 0438  WBC 10.2  NEUTROABS 7.7  HGB 12.4  HCT 38.5  MCV 88.1  PLT 246    Recent Results (from the past 240 hour(s))  MRSA PCR SCREENING     Status: None   Collection Time    11/05/13  2:54 PM      Result Value Range Status   MRSA by PCR NEGATIVE  NEGATIVE Final   Comment:            The GeneXpert MRSA Assay (FDA     approved for NASAL specimens     only), is one component of a     comprehensive MRSA colonization     surveillance program. It is not     intended to diagnose MRSA     infection nor to guide or     monitor treatment for     MRSA infections.     Hospital Course:  This is an 80 years old female patient with history of multiple medical illnesses who was admitted due to shortness of breath. Patient was found to have diastolic CHF and hypertension. She was evaluated by cardiology and was started on multiple antihypertensive and oral diuretics. Her shortness of breath improved and her B/P is being controlled. Patient is discharged in stable condition to be followed in out patient.  Discharge Exam: Blood pressure 167/59, pulse 71, temperature 98.2 F (36.8 C), temperature source Oral, resp. rate 14, height 5' 7.5" (1.715 m), weight 83.1 kg (183 lb 3.2  oz), SpO2 98.00%.   Disposition:  home      Signed: Addie Cederberg   11/13/2013, 8:17 AM

## 2013-12-04 ENCOUNTER — Encounter: Payer: Self-pay | Admitting: Cardiovascular Disease

## 2013-12-04 ENCOUNTER — Encounter (INDEPENDENT_AMBULATORY_CARE_PROVIDER_SITE_OTHER): Payer: Self-pay

## 2013-12-04 ENCOUNTER — Ambulatory Visit (INDEPENDENT_AMBULATORY_CARE_PROVIDER_SITE_OTHER): Payer: Medicare Other | Admitting: Cardiovascular Disease

## 2013-12-04 VITALS — BP 187/91 | HR 60 | Ht 67.5 in | Wt 178.5 lb

## 2013-12-04 DIAGNOSIS — I10 Essential (primary) hypertension: Secondary | ICD-10-CM

## 2013-12-04 DIAGNOSIS — I4891 Unspecified atrial fibrillation: Secondary | ICD-10-CM

## 2013-12-04 DIAGNOSIS — Z7901 Long term (current) use of anticoagulants: Secondary | ICD-10-CM

## 2013-12-04 DIAGNOSIS — R42 Dizziness and giddiness: Secondary | ICD-10-CM

## 2013-12-04 DIAGNOSIS — I5032 Chronic diastolic (congestive) heart failure: Secondary | ICD-10-CM

## 2013-12-04 DIAGNOSIS — I4821 Permanent atrial fibrillation: Secondary | ICD-10-CM

## 2013-12-04 DIAGNOSIS — I739 Peripheral vascular disease, unspecified: Secondary | ICD-10-CM

## 2013-12-04 MED ORDER — AMLODIPINE BESYLATE 5 MG PO TABS: 5.0000 mg | ORAL_TABLET | Freq: Every day | ORAL | Status: DC

## 2013-12-04 NOTE — Patient Instructions (Addendum)
Your physician wants you to follow-up in 6 weeks    Your physician has recommended you make the following change in your medication:    START Norvasc 5 mg daily

## 2013-12-04 NOTE — Progress Notes (Signed)
Patient ID: [name], female   DOB: 1931-01-28, 80 y.o.   MRN: [id number]      SUBJECTIVE: The patient is an 80 year old woman who has a history of tobacco abuse, hypertension, hyperlipidemia, and peripheral vascular occlusive disease. She has a known 70% right renal artery stenosis, as well as bilateral iliac and SFA disease. She has known occluded SFAs bilaterally and has had bilateral iliac stenting for chronic total occlusions in 2009 and 2010.  She currently denies claudication. She did have a negative Myoview, February 12, 2010, and had an echocardiogram in 10/2013 which showed vigorous LV systolic function, EF 75-80%, with near obliteration of the left ventricular cavity during systole and a gradient of 30-40 mm mercury, along with moderately elevated pulmonary pressures, grade II diastolic dysfunction, and a small to medium size pericardial effusion, with severe LAE and moderate RAE. Most recent Dopplers reveal ABIs in the 0.5 to 0.6 range bilaterally with progression of disease in her right external iliac artery, suggesting "in-stent restenosis," as well as her left external iliac artery. She also has atrial fibrillation for which she takes Eliquis. She had less than 50% bilateral internal carotid artery stenosis. She also has COPD.  She was hospitalized for acute on chronic diastolic heart failure in December 2014. At that time she was in atrial fibrillation and had very labile blood pressures. She is here with her daughter [name] who is from [place]. The patient denies chest pain and claudication pain. She denies palpitations and syncope. She does get lightheaded and dizzy with relatively minimal exertion. Her hydralazine was discontinued by her PCP. She is taking 300 mg twice a day of labetalol. Her blood pressure today is 187/91 with a pulse of 60. She denies orthopnea, PND and leg swelling.    No Known Allergies  Current Outpatient Prescriptions  Medication Sig Dispense Refill   . albuterol (PROVENTIL HFA;VENTOLIN HFA) 108 (90 BASE) MCG/ACT inhaler Inhale 2 puffs into the lungs every 6 (six) hours as needed for wheezing.  1 Inhaler  2  . apixaban (ELIQUIS) 2.5 MG TABS tablet Take 2.5 mg by mouth 2 (two) times daily.      Marland Kitchen aspirin EC 81 MG tablet Take 81 mg by mouth every morning.       . Fluticasone Furoate-Vilanterol (BREO ELLIPTA) 100-25 MCG/INH AEPB Inhale 1 puff into the lungs daily.      . furosemide (LASIX) 80 MG tablet Take 40 mg by mouth daily.      Marland Kitchen labetalol (NORMODYNE) 300 MG tablet Take 2 tablets (600 mg total) by mouth 3 (three) times daily.  90 tablet  3  . lisinopril (PRINIVIL,ZESTRIL) 40 MG tablet Take 1 tablet (40 mg total) by mouth daily.  30 tablet  3  . nicotine (NICODERM CQ - DOSED IN MG/24 HOURS) 21 mg/24hr patch Place 1 patch (21 mg total) onto the skin daily.  28 patch  0  . potassium chloride SA (K-DUR,KLOR-CON) 20 MEQ tablet Take 2 tablets (40 mEq total) by mouth 2 (two) times daily.  60 tablet  3  . simvastatin (ZOCOR) 20 MG tablet Take 20 mg by mouth every evening.      . tiotropium (SPIRIVA) 18 MCG inhalation capsule Place 18 mcg into inhaler and inhale every morning.      . hydrALAZINE (APRESOLINE) 50 MG tablet Take 75 mg by mouth 3 (three) times daily. STOPPED TAKING 11-17-13 PER PCP       No current facility-administered medications for this visit.  Past Medical History  Diagnosis Date  . Mixed hyperlipidemia   . Coronary artery disease     [facility]  . PVD (peripheral vascular disease)     Right ABI of 0.5 and left ABI 0.62, occluded bilateral SFAs  . Essential hypertension, benign   . DVT (deep venous thrombosis) 05/2008  . Bilateral iliac artery occlusion   . Aortic valve sclerosis   . Atrial fibrillation     History of GI bleed on Coumadin  . Stroke 03/2013    Reportedly mild    Past Surgical History  Procedure Laterality Date  . Iliac artery stent Left 05/21/2008    Dr. Allyson SabalBerry  . Iliac artery  stent Right 01/07/2009    Dr. Allyson SabalBerry  . Brain tumor excision  1999    Hemangioma [facility]  . Cataract extraction Bilateral   . Lens replacement Left   . Doppler echocardiography  02/12/2010    EF =>55%  . Nm myocar perf wall motion  02/12/2010    PERSANTINE: EF 57%;LV norm  . Lea doppler  09/13/2012    Right EIA stent >60%;rgt SFA occluded at prox. and mid level w/reconstitution at distal SFA/pop.;rgt PTA occlude;lft EIAstent >50%;lft mid SFA occluded;lft ATA & PTA occluded  . Renal duplex doppler  02/25/2012    Right 60-99%;lft renal artery no evidence siginificant reduction;rgt & lft kidneys norm size     History   Social History  . Marital Status: Widowed    Spouse Name: N/A    Number of Children: 2  . Years of Education: N/A   Occupational History  .     Social History Main Topics  . Smoking status: Former Smoker -- 0.00 packs/day for 35 years    Types: Cigarettes  . Smokeless tobacco: Not on file     Comment: stopped 29 days ago as of 12-04-13  . Alcohol Use: No  . Drug Use: No  . Sexual Activity: No   Other Topics Concern  . Not on file   Social History Narrative  . No narrative on file     Filed Vitals:   12/04/13 0810  BP: 187/91  Pulse: 60  Height: 5' 7.5" (1.715 m)  Weight: 178 lb 8 oz (80.967 kg)    PHYSICAL EXAM General: NAD Neck: No JVD, no thyromegaly or thyroid nodule.  Lungs: Clear to auscultation bilaterally with normal respiratory effort. CV: Nondisplaced PMI.  Heart irregular rhythm, normal S1/S2, no S3, no murmur.  No peripheral edema.  No carotid bruit.    Abdomen: Soft, nontender, no hepatosplenomegaly, no distention.  Neurologic: Alert and oriented x 3.  Psych: Normal affect. Extremities: No clubbing or cyanosis.   ECG: reviewed and available in electronic records.   Echo (11-06-2013): Study Conclusions  - Left ventricle: The cavity size was normal. Wall thickness was increased in a pattern of moderate LVH. Systolic function  was vigorous. The estimated ejection fraction was in the range of 75% to 80%. There is near obliteration of the mid cavity in systole with mild to moderate gradient of 30-40 mmHg. Wall motion was normal; there were no regional wall motion abnormalities. Features are consistent with a pseudonormal left ventricular filling pattern, with concomitant abnormal relaxation and increased filling pressure (grade 2 diastolic dysfunction). Doppler parameters are consistent with high ventricular filling pressure. - Aortic valve: Mildly calcified annulus. Trileaflet. No significant regurgitation. - Mitral valve: Calcified annulus. Mild regurgitation. - Left atrium: The atrium was severely dilated. - Right atrium:  The atrium was moderately dilated. - Atrial septum: No defect or patent foramen ovale was identified. - Tricuspid valve: Mild regurgitation. - Pulmonary arteries: Systolic pressure was moderately increased. PA peak pressure: 58mm Hg (S). - Pericardium, extracardiac: A small to moderate pericardial effusion was identified circumferential to the heart. No definite tamponade physiology. Impressions:  - Comparison to prior study February 2014. Moderate LVH with LVEF 75-80%, mid cavitary gradient of 30-40 mmHg, grade 2 diastolic dysfunction. Severe left atrial and moderate right atrial enlargement. Mild mitral regurgitation. Mild tricuspid regurgitation with PASP 58 mmHg. Small to moderate, circumferential pericardial effusion without tamponade.    ASSESSMENT AND PLAN: Atrial fibrillation  The patient was previously placed on Coumadin but developed a GI bleed and Coumadin coagulopathy, requiring reversal with vitamin K. Ultimately she was changed to Eliquis at a reduced dose because of renal function. She is tolerating it well without complications. HR is controlled on labetalol.  Peripheral vascular disease with claudication  Currently denies claudications. Dopplers performed 09/13/12  revealed a right ABI of 0.5 and a left ABI 0.62. Her stents appear to have in-stent restenosis. She has occluded SFAs bilaterally.   Malignant hypertension  Given her HR of 60 bpm with symptoms of dizziness/lightheadedness, I will not increase labetalol, but will add amlodipine 5 mg daily. She checks her BP twice daily. Of note, she does have a 70% right renal artery stenosis which is likely contributing to her refractory HTN.  Chronic diastolic heart failure Currently euvolemic. She takes Lasix 40 mg daily with KCl. Will aim for more optimal control of BP.  Dispo: f/u 6 weeks.  Time spent: 40 minutes.  Prentice Docker, M.D., F.A.C.C.

## 2014-01-14 ENCOUNTER — Ambulatory Visit: Payer: Medicare Other | Admitting: Cardiovascular Disease

## 2014-01-18 ENCOUNTER — Encounter (INDEPENDENT_AMBULATORY_CARE_PROVIDER_SITE_OTHER): Payer: Self-pay

## 2014-01-18 ENCOUNTER — Ambulatory Visit (INDEPENDENT_AMBULATORY_CARE_PROVIDER_SITE_OTHER): Payer: Medicare Other | Admitting: Cardiovascular Disease

## 2014-01-18 VITALS — BP 155/81 | HR 73 | Ht 67.0 in | Wt 183.0 lb

## 2014-01-18 DIAGNOSIS — I10 Essential (primary) hypertension: Secondary | ICD-10-CM

## 2014-01-18 DIAGNOSIS — I4891 Unspecified atrial fibrillation: Secondary | ICD-10-CM

## 2014-01-18 DIAGNOSIS — I739 Peripheral vascular disease, unspecified: Secondary | ICD-10-CM

## 2014-01-18 DIAGNOSIS — I5032 Chronic diastolic (congestive) heart failure: Secondary | ICD-10-CM

## 2014-01-18 MED ORDER — FUROSEMIDE 40 MG PO TABS: 40.0000 mg | ORAL_TABLET | Freq: Every day | ORAL | Status: DC

## 2014-01-18 MED ORDER — FUROSEMIDE 80 MG PO TABS: 40.0000 mg | ORAL_TABLET | Freq: Every day | ORAL | Status: DC

## 2014-01-18 NOTE — Patient Instructions (Addendum)
Your physician wants you to follow-up in: 4 months You will receive a reminder letter in the mail two months in advance. If you don't receive a letter, please call our office to schedule the follow-up appointment.   Your physician recommends that you continue on your current medications as directed. Please refer to the Current Medication list given to you today.      Thank you for choosing [facility] !         

## 2014-01-18 NOTE — Progress Notes (Signed)
Patient ID: [name], female   DOB: 1931-05-14, 80 y.o.   MRN: [id number]      SUBJECTIVE: The patient is an 80 year old woman who has a history of tobacco abuse, hypertension, hyperlipidemia, and peripheral vascular occlusive disease. She has a known 70% right renal artery stenosis, as well as bilateral iliac and SFA disease. She has known occluded SFAs bilaterally and has had bilateral iliac stenting for chronic total occlusions in 2009 and 2010.  She currently denies claudication. She did have a negative Myoview, February 12, 2010, and had an echocardiogram in 10/2013 which showed vigorous LV systolic function, EF 75-80%, with near obliteration of the left ventricular cavity during systole and a gradient of 30-40 mm mercury, along with moderately elevated pulmonary pressures, grade II diastolic dysfunction, and a small to medium size pericardial effusion, with severe LAE and moderate RAE. Most recent Dopplers reveal ABIs in the 0.5 to 0.6 range bilaterally with progression of disease in her right external iliac artery, suggesting "in-stent restenosis," as well as her left external iliac artery. She also has atrial fibrillation for which she takes Eliquis. She had less than 50% bilateral internal carotid artery stenosis. She also has COPD. She was hospitalized for acute on chronic diastolic heart failure in December 2014. At that time she was in atrial fibrillation and had very labile blood pressures.  When she last came, her daughter [name] from [place] accompanied her. Today, she is with her daughter from [place].  The patient denies chest pain, shortness of breath, leg swelling, orthopnea (sleeps with 2 pillows for comfort), PND, and claudication.      No Known Allergies  Current Outpatient Prescriptions  Medication Sig Dispense Refill  . albuterol (PROVENTIL HFA;VENTOLIN HFA) 108 (90 BASE) MCG/ACT inhaler Inhale 2 puffs into the lungs every 6 (six) hours as needed for  wheezing.  1 Inhaler  2  . amLODipine (NORVASC) 5 MG tablet Take 1 tablet (5 mg total) by mouth daily.  90 tablet  3  . apixaban (ELIQUIS) 2.5 MG TABS tablet Take 2.5 mg by mouth 2 (two) times daily.      Marland Kitchen aspirin EC 81 MG tablet Take 81 mg by mouth every morning.       . Fluticasone Furoate-Vilanterol (BREO ELLIPTA) 100-25 MCG/INH AEPB Inhale 1 puff into the lungs daily.      . furosemide (LASIX) 80 MG tablet Take 40 mg by mouth daily.      Marland Kitchen labetalol (NORMODYNE) 300 MG tablet Take 600 mg by mouth 2 (two) times daily.      Marland Kitchen lisinopril (PRINIVIL,ZESTRIL) 40 MG tablet Take 1 tablet (40 mg total) by mouth daily.  30 tablet  3  . nicotine (NICODERM CQ - DOSED IN MG/24 HOURS) 21 mg/24hr patch Place 1 patch (21 mg total) onto the skin daily.  28 patch  0  . potassium chloride SA (K-DUR,KLOR-CON) 20 MEQ tablet Take 2 tablets (40 mEq total) by mouth 2 (two) times daily.  60 tablet  3  . simvastatin (ZOCOR) 20 MG tablet Take 20 mg by mouth every evening.      . tiotropium (SPIRIVA) 18 MCG inhalation capsule Place 18 mcg into inhaler and inhale every morning.       No current facility-administered medications for this visit.    Past Medical History  Diagnosis Date  . Mixed hyperlipidemia   . Coronary artery disease     Followed by Dr. Allyson Sabal with [facility]  . PVD (peripheral vascular disease)  Right ABI of 0.5 and left ABI 0.62, occluded bilateral SFAs  . Essential hypertension, benign   . DVT (deep venous thrombosis) 05/2008  . Bilateral iliac artery occlusion   . Aortic valve sclerosis   . Atrial fibrillation     History of GI bleed on Coumadin  . Stroke 03/2013    Reportedly mild    Past Surgical History  Procedure Laterality Date  . Iliac artery stent Left 05/21/2008    Dr. Allyson SabalBerry  . Iliac artery stent Right 01/07/2009    Dr. Allyson SabalBerry  . Brain tumor excision  1999    [facility]emangioma [facility]  . Cataract extraction Bilateral   . Lens replacement Left   . Doppler echocardiography  02/12/2010      EF =>55%  . Nm myocar perf wall motion  02/12/2010    PERSANTINE: EF 57%;LV norm  . Lea doppler  09/13/2012    Right EIA stent >60%;rgt SFA occluded at prox. and mid level w/reconstitution at distal SFA/pop.;rgt PTA occlude;lft EIAstent >50%;lft mid SFA occluded;lft ATA & PTA occluded  . Renal duplex doppler  02/25/2012    Right 60-99%;lft renal artery no evidence siginificant reduction;rgt & lft kidneys norm size     History   Social History  . Marital Status: Widowed    Spouse Name: N/A    Number of Children: 2  . Years of Education: N/A   Occupational History  .     Social History Main Topics  . Smoking status: Former Smoker -- 0.00 packs/day for 35 years    Types: Cigarettes  . Smokeless tobacco: Not on file     Comment: stopped 29 days ago as of 12-04-13  . Alcohol Use: No  . Drug Use: No  . Sexual Activity: No   Other Topics Concern  . Not on file   Social History Narrative  . No narrative on file     Filed Vitals:   01/18/14 0938  BP: 155/81  Pulse: 73  Height: 5\' 7"  (1.702 m)  Weight: 183 lb (83.008 kg)    PHYSICAL EXAM General: NAD  Neck: No JVD, no thyromegaly or thyroid nodule.  Lungs: Clear to auscultation bilaterally with normal respiratory effort.  CV: Nondisplaced PMI. Heart irregular rhythm, normal S1/S2, no S3, no murmur. No peripheral edema. No carotid bruit.  Abdomen: Soft, nontender, no hepatosplenomegaly, no distention.  Neurologic: Alert and oriented x 3.  Psych: Normal affect.  Extremities: No clubbing or cyanosis.    ECG: reviewed and available in electronic records.      ASSESSMENT AND PLAN:   Atrial fibrillation  The patient was previously placed on Coumadin but developed a GI bleed and Coumadin coagulopathy, requiring reversal with vitamin K. Ultimately she was changed to Eliquis at a reduced dose because of renal function. She is tolerating it well without complications.   Peripheral vascular disease with claudication   Currently denies claudication. Dopplers performed 09/13/12 revealed a right ABI of 0.5 and a left ABI 0.62. Her stents appear to have in-stent restenosis. She has occluded SFA's bilaterally.   Malignant hypertension  At her last visit, I added amlodipine 5 mg daily. HR is controlled for age on labetalol.  Of note, she does have a 70% right renal artery stenosis which is likely contributing to her refractory HTN.   Chronic diastolic heart failure  Currently euvolemic. She takes Lasix 40 mg daily with KCl. Will aim for optimal control of BP.   Dispo: f/u 4 months.    Darliss RidgelSuresh  Bronson Ing, M.D., F.A.C.C.

## 2014-05-24 ENCOUNTER — Encounter: Payer: Self-pay | Admitting: Cardiovascular Disease

## 2014-05-24 ENCOUNTER — Ambulatory Visit (INDEPENDENT_AMBULATORY_CARE_PROVIDER_SITE_OTHER): Payer: Medicare Other | Admitting: Cardiovascular Disease

## 2014-05-24 VITALS — BP 110/70 | HR 68 | Ht 67.0 in | Wt 177.0 lb

## 2014-05-24 DIAGNOSIS — I5032 Chronic diastolic (congestive) heart failure: Secondary | ICD-10-CM

## 2014-05-24 DIAGNOSIS — I739 Peripheral vascular disease, unspecified: Secondary | ICD-10-CM

## 2014-05-24 DIAGNOSIS — I10 Essential (primary) hypertension: Secondary | ICD-10-CM

## 2014-05-24 DIAGNOSIS — I4821 Permanent atrial fibrillation: Secondary | ICD-10-CM

## 2014-05-24 DIAGNOSIS — I4891 Unspecified atrial fibrillation: Secondary | ICD-10-CM

## 2014-05-24 DIAGNOSIS — R42 Dizziness and giddiness: Secondary | ICD-10-CM

## 2014-05-24 NOTE — Patient Instructions (Signed)
Your physician wants you to follow-up in: mid December You will receive a reminder letter in the mail two months in advance. If you don't receive a letter, please call our office to schedule the follow-up appointment.   Your physician recommends that you continue on your current medications as directed. Please refer to the Current Medication list given to you today.      Thank you for choosing [facility] !

## 2014-05-24 NOTE — Progress Notes (Signed)
Patient ID: [name], female   DOB: 1931-08-27, 80 y.o.   MRN: [id number]      SUBJECTIVE: The patient is an 80 year old woman who has a history of tobacco abuse, hypertension, hyperlipidemia, atrial fibrillation, and peripheral vascular occlusive disease. She has a known 70% right renal artery stenosis, as well as bilateral iliac and SFA disease. She has known occluded SFAs bilaterally and has had bilateral iliac stenting for chronic total occlusions in 2009 and 2010.   She had a negative Myoview, February 12, 2010, and had an echocardiogram in 10/2013 which showed vigorous LV systolic function, EF 75-80%, with near obliteration of the left ventricular cavity during systole and a gradient of 30-40 mm mercury, along with moderately elevated pulmonary pressures, grade II diastolic dysfunction, and a small to medium size pericardial effusion, with severe LAE and moderate RAE. Most recent Dopplers reveal ABIs in the 0.5 to 0.6 range bilaterally with progression of disease in her right external iliac artery, suggesting "in-stent restenosis," as well as her left external iliac artery. She also has atrial fibrillation for which she takes Eliquis. She had less than 50% bilateral internal carotid artery stenosis. She also has COPD.   She was hospitalized for acute on chronic diastolic heart failure in December 2014. At that time she was in atrial fibrillation and had very labile blood pressures.   She is here with her daughter [name]) from [place]. Last time, she came with her daughter [name]) from [place].   The patient denies chest pain, shortness of breath, leg swelling, orthopnea (sleeps with 2 pillows for comfort), PND, and claudication.   She had some mild dizziness when walking into our office today, but her daughter says she got up and out of the car too quickly.  She recently spent a month in [place] and had no health problems whatsoever.     No Known Allergies  Current Outpatient  Prescriptions  Medication Sig Dispense Refill  . albuterol (PROVENTIL HFA;VENTOLIN HFA) 108 (90 BASE) MCG/ACT inhaler Inhale 2 puffs into the lungs every 6 (six) hours as needed for wheezing.  1 Inhaler  2  . amLODipine (NORVASC) 5 MG tablet Take 1 tablet (5 mg total) by mouth daily.  90 tablet  3  . apixaban (ELIQUIS) 2.5 MG TABS tablet Take 2.5 mg by mouth 2 (two) times daily.      Marland Kitchen aspirin EC 81 MG tablet Take 81 mg by mouth every morning.       . Fluticasone Furoate-Vilanterol (BREO ELLIPTA) 100-25 MCG/INH AEPB Inhale 1 puff into the lungs daily.      . furosemide (LASIX) 40 MG tablet Take 1 tablet (40 mg total) by mouth daily.  90 tablet  3  . labetalol (NORMODYNE) 300 MG tablet Take 600 mg by mouth 2 (two) times daily.      Marland Kitchen lisinopril (PRINIVIL,ZESTRIL) 40 MG tablet Take 1 tablet (40 mg total) by mouth daily.  30 tablet  3  . potassium chloride SA (K-DUR,KLOR-CON) 20 MEQ tablet Take 2 tablets (40 mEq total) by mouth 2 (two) times daily.  60 tablet  3  . simvastatin (ZOCOR) 20 MG tablet Take 20 mg by mouth every evening.      . tiotropium (SPIRIVA) 18 MCG inhalation capsule Place 18 mcg into inhaler and inhale every morning.       No current facility-administered medications for this visit.    Past Medical History  Diagnosis Date  . Mixed hyperlipidemia   . Coronary artery disease  [facility]  . PVD (peripheral vascular disease)     Right ABI of 0.5 and left ABI 0.62, occluded bilateral SFAs  . Essential hypertension, benign   . DVT (deep venous thrombosis) 05/2008  . Bilateral iliac artery occlusion   . Aortic valve sclerosis   . Atrial fibrillation     History of GI bleed on Coumadin  . Stroke 03/2013    Reportedly mild    Past Surgical History  Procedure Laterality Date  . Iliac artery stent Left 05/21/2008    Dr. Allyson SabalBerry  . Iliac artery stent Right 01/07/2009    Dr. Allyson SabalBerry  . Brain tumor excision  1999    Hemangioma [facility]  . Cataract  extraction Bilateral   . Lens replacement Left   . Doppler echocardiography  02/12/2010    EF =>55%  . Nm myocar perf wall motion  02/12/2010    PERSANTINE: EF 57%;LV norm  . [place] doppler  09/13/2012    Right EIA stent >60%;rgt SFA occluded at prox. and mid level w/reconstitution at distal SFA/pop.;rgt PTA occlude;lft EIAstent >50%;lft mid SFA occluded;lft ATA & PTA occluded  . Renal duplex doppler  02/25/2012    Right 60-99%;lft renal artery no evidence siginificant reduction;rgt & lft kidneys norm size     History   Social History  . Marital Status: Widowed    Spouse Name: N/A    Number of Children: 2  . Years of Education: N/A   Occupational History  .     Social History Main Topics  . Smoking status: Former Smoker -- 0.00 packs/day for 35 years    Types: Cigarettes  . Smokeless tobacco: Not on file     Comment: stopped 29 days ago as of 12-04-13  . Alcohol Use: No  . Drug Use: No  . Sexual Activity: No   Other Topics Concern  . Not on file   Social History Narrative  . No narrative on file     Filed Vitals:   05/24/14 1402  BP: 110/70  Pulse: 68  Height: 5\' 7"  (1.702 m)  Weight: 177 lb (80.287 kg)  SpO2: 98%    PHYSICAL EXAM General: NAD  Neck: No JVD, no thyromegaly or thyroid nodule.  Lungs: Clear to auscultation bilaterally with normal respiratory effort.  CV: Nondisplaced PMI. Heart irregular rhythm, normal S1/S2, no S3, no murmur. No peripheral edema. No carotid bruit.  Abdomen: Soft, nontender, no hepatosplenomegaly, no distention.  Neurologic: Alert and oriented x 3.  Psych: Normal affect.  Extremities: No clubbing or cyanosis. Feet are warm to touch, pulses are diminished b/l.  ECG: reviewed and available in electronic records.    ASSESSMENT AND PLAN:  Atrial fibrillation  The patient was previously placed on Coumadin but developed a GI bleed and Coumadin coagulopathy, requiring reversal with vitamin K. Ultimately she was changed to Eliquis  at a reduced dose because of renal function. She is tolerating it well without complications.   Peripheral vascular disease with claudication  Currently denies claudication. Dopplers performed 09/13/12 revealed a right ABI of 0.5 and a left ABI 0.62. Her stents appear to have in-stent restenosis. She has occluded SFA's bilaterally. I will check with Dr. Allyson SabalBerry to see when he would like her to follow up. It is difficult for her to go to [place] due to transportation issues, as her daughters live in [place].  Malignant hypertension  Well controlled on current therapy. Of note, she does have a 70% right renal artery  stenosis which is likely contributing to her refractory HTN.   Chronic diastolic heart failure  Currently euvolemic. She takes Lasix 40 mg daily with KCl. Will aim for optimal control of BP.   Dispo: f/u in December.  Prentice Docker, M.D., F.A.C.C.

## 2014-06-06 ENCOUNTER — Telehealth: Payer: Self-pay

## 2014-06-06 NOTE — Telephone Encounter (Signed)
Cathey,    Can you please help arrange this? Thanks.        Darliss RidgelSuresh        ----- Message -----    From: Runell GessJonathan J Berry, MD    Sent: 05/25/2014 10:01 AM    To: Laqueta LindenSuresh A Koneswaran, MD        Darliss RidgelSuresh, it's been over a year since I've seen Emmelyn and close to 2 years since her last LEA's. If you could schedule her to have LEA's in the [place] office one morning and I can see her the same afternoon to save a trip. Thanks.        Christiane HaJonathan    ----- Message -----    From: Laqueta LindenSuresh A Koneswaran, MD    Sent: 05/24/2014 2:28 PM    To: Runell GessJonathan J Berry, MD        Cleone SlimHi Christiane HaJonathan. Just wanted to find out when you'd like to follow up with her for PVD. Her daughters live in [place] so transportation has to be worked out well in advance.    Thanks.        Darliss RidgelSuresh               I forwarded this to Dr.Berry's nurse Monte FantasiaKathryn Vogle to expedite yesterday

## 2014-07-10 ENCOUNTER — Other Ambulatory Visit (HOSPITAL_COMMUNITY): Payer: Self-pay | Admitting: Nephrology

## 2014-07-10 DIAGNOSIS — N289 Disorder of kidney and ureter, unspecified: Secondary | ICD-10-CM

## 2014-07-19 ENCOUNTER — Ambulatory Visit (HOSPITAL_BASED_OUTPATIENT_CLINIC_OR_DEPARTMENT_OTHER)
Admission: RE | Admit: 2014-07-19 | Discharge: 2014-07-19 | Disposition: A | Discharge status: Home / Self Care | Payer: Medicare Other | Source: Ambulatory Visit | Attending: Cardiology | Admitting: Cardiology

## 2014-07-19 ENCOUNTER — Ambulatory Visit (HOSPITAL_COMMUNITY)
Admission: RE | Admit: 2014-07-19 | Discharge: 2014-07-19 | Disposition: A | Discharge status: Home / Self Care | Payer: Medicare Other | Source: Ambulatory Visit | Attending: Cardiology | Admitting: Cardiology

## 2014-07-19 ENCOUNTER — Ambulatory Visit (INDEPENDENT_AMBULATORY_CARE_PROVIDER_SITE_OTHER): Payer: Medicare Other | Admitting: Cardiovascular Disease

## 2014-07-19 ENCOUNTER — Encounter: Payer: Self-pay | Admitting: Cardiovascular Disease

## 2014-07-19 VITALS — BP 100/58 | HR 57 | Ht 67.5 in | Wt 180.4 lb

## 2014-07-19 DIAGNOSIS — I739 Peripheral vascular disease, unspecified: Secondary | ICD-10-CM

## 2014-07-19 DIAGNOSIS — I4891 Unspecified atrial fibrillation: Secondary | ICD-10-CM

## 2014-07-19 DIAGNOSIS — E785 Hyperlipidemia, unspecified: Secondary | ICD-10-CM

## 2014-07-19 DIAGNOSIS — I10 Essential (primary) hypertension: Secondary | ICD-10-CM

## 2014-07-19 DIAGNOSIS — I70219 Atherosclerosis of native arteries of extremities with intermittent claudication, unspecified extremity: Secondary | ICD-10-CM

## 2014-07-19 DIAGNOSIS — I4821 Permanent atrial fibrillation: Secondary | ICD-10-CM

## 2014-07-19 NOTE — Progress Notes (Signed)
07/19/2014 [name]   11-14-1930  [id number]  Primary Physician Avon Gully, MD Primary Cardiologist: Runell Gess MD [name]   HPI:  The patient is a 80 year old mildly overweight, widowed [place] American female, mother of 2, grandmother to 2 grandchildren, who I saw 12 months ago. She has a history of continued tobacco abuse, hypertension, hyperlipidemia, and peripheral vascular occlusive disease. She has a known 70% right renal artery stenosis, which we have been following by duplex ultrasound, as well as bilateral iliac and SFA disease. She has known occluded SFAs bilaterally and has had bilateral iliac stenting for chronic total occlusions in 2009 and 2010. She currently denies claudication. We have been following her Dopplers annually. She did have a negative Myoview, February 12, 2010, with normal LV function by 2D echocardiography. Recent Dopplers reveal ABIs in the 0.5 to 0.6 range bilaterally with progression of disease in her right external iliac artery, suggesting "in-stent restenosis," as well as her left external iliac artery. When I saw her last she is noted to be in atrial fibrillation. I elected to begin her on Coumadin anticoagulation. She has had coagulopathy with GI bleed requiring reverse with vitamin K. Ultimately she was changed to Eliquis. She said this is the comfort TIA and syncope. Workup including MRI of the head, CT of the head and carotid Dopplers were unrevealing. She had less than 50% bilateral internal carotid artery stenosis. She was also diagnosed with COPD and continues to smoke several cigarettes a day.. She denies chest pain..she sees Dr. Purvis Sheffield in our [place]  office.     Current Outpatient Prescriptions  Medication Sig Dispense Refill  . albuterol (PROVENTIL HFA;VENTOLIN HFA) 108 (90 BASE) MCG/ACT inhaler Inhale 2 puffs into the lungs every 6 (six) hours as needed for wheezing.  1 Inhaler  2  . amLODipine (NORVASC) 5  MG tablet Take 1 tablet (5 mg total) by mouth daily.  90 tablet  3  . apixaban (ELIQUIS) 2.5 MG TABS tablet Take 2.5 mg by mouth 2 (two) times daily.      Marland Kitchen aspirin EC 81 MG tablet Take 81 mg by mouth every morning.       . Fluticasone Furoate-Vilanterol (BREO ELLIPTA) 100-25 MCG/INH AEPB Inhale 1 puff into the lungs daily.      . furosemide (LASIX) 40 MG tablet Take 1 tablet (40 mg total) by mouth daily.  90 tablet  3  . labetalol (NORMODYNE) 300 MG tablet Take 600 mg by mouth 2 (two) times daily.      Marland Kitchen lisinopril (PRINIVIL,ZESTRIL) 40 MG tablet Take 1 tablet (40 mg total) by mouth daily.  30 tablet  3  . potassium chloride SA (K-DUR,KLOR-CON) 20 MEQ tablet Take 2 tablets (40 mEq total) by mouth 2 (two) times daily.  60 tablet  3  . simvastatin (ZOCOR) 20 MG tablet Take 20 mg by mouth every evening.      . tiotropium (SPIRIVA) 18 MCG inhalation capsule Place 18 mcg into inhaler and inhale every morning.       No current facility-administered medications for this visit.    No Known Allergies  History   Social History  . Marital Status: Widowed    Spouse Name: N/A    Number of Children: 2  . Years of Education: N/A   Occupational History  .     Social History Main Topics  . Smoking status: Current Every Day Smoker -- 0.10 packs/day for 35 years    Types: Cigarettes  .  Smokeless tobacco: Not on file     Comment: stopped 29 days ago as of 12-04-13  . Alcohol Use: No  . Drug Use: No  . Sexual Activity: No   Other Topics Concern  . Not on file   Social History Narrative  . No narrative on file     Review of Systems: General: negative for chills, fever, night sweats or weight changes.  Cardiovascular: negative for chest pain, dyspnea on exertion, edema, orthopnea, palpitations, paroxysmal nocturnal dyspnea or shortness of breath Dermatological: negative for rash Respiratory: negative for cough or wheezing Urologic: negative for hematuria Abdominal: negative for nausea,  vomiting, diarrhea, bright red blood per rectum, melena, or hematemesis Neurologic: negative for visual changes, syncope, or dizziness All other systems reviewed and are otherwise negative except as noted above.    Blood pressure 100/58, pulse 57, height 5' 7.5" (1.715 m), weight 180 lb 6.4 oz (81.829 kg).  General appearance: alert and no distress Neck: no adenopathy, no carotid bruit, no JVD, supple, symmetrical, trachea midline and thyroid not enlarged, symmetric, no tenderness/mass/nodules Lungs: clear to auscultation bilaterally Heart: irregularly irregular rhythm Extremities: extremities normal, atraumatic, no cyanosis or edema and 2+ femorals without bruits  EKG atrial fibrillation with a ventricular response of 57 and evidence of LVH with repolarization changes. Unchanged from prior EKGs  ASSESSMENT AND PLAN:   Permanent atrial fibrillation Rate controlled on Eliquis anticoagulation  Dyslipidemia On statin therapy, followed by her PCP  HTN (hypertension) Controlled on current medications  Peripheral vascular disease with claudication History of staged bilateral external iliac arteries stenting for chronic total occlusions by myself initially 05/21/08 on the left, 01/07/2009 on the right. She has known occluded SFAs bilaterally. Her most recent lower extremity arterial Doppler studies performed 09/13/12 revealed ABIs in the 0.6 range bilaterally with moderate in-stent restenosis of right greater than left an occluded SFAs. She really denies significant lifestyle limiting claudication. We will continue to follow these by duplex ultrasound.      Runell Gess MD FACP,FACC,FAHA, [facility] 07/19/2014 10:42 AM

## 2014-07-19 NOTE — Patient Instructions (Signed)
  We will see you back in follow up in 1 year with Dr Allyson Sabal.   Dr Allyson Sabal has ordered: 1. lower extremity arterial doppler- During this test, ultrasound is used to evaluate arterial blood flow in the legs. Allow approximately one hour for this exam.   2. renal artery duplex- During this test, an ultrasound is used to evaluate blood flow to the kidneys. Allow one hour for this exam. Do not eat after midnight the day before and avoid carbonated beverages. Take your medications as you usually do.

## 2014-07-19 NOTE — Assessment & Plan Note (Signed)
Rate controlled on Eliquis anticoagulation

## 2014-07-19 NOTE — Progress Notes (Signed)
Renal Duplex Completed. Azad Calame, BS, RDMS, RVT  

## 2014-07-19 NOTE — Assessment & Plan Note (Signed)
On statin therapy, followed by her PCP 

## 2014-07-19 NOTE — Assessment & Plan Note (Signed)
Controlled on current medications 

## 2014-07-19 NOTE — Progress Notes (Signed)
Arterial Duplex Lower Ext. Completed. Jentry Mcqueary, BS, RDMS, RVT  

## 2014-07-19 NOTE — Assessment & Plan Note (Signed)
History of staged bilateral external iliac arteries stenting for chronic total occlusions by myself initially 05/21/08 on the left, 01/07/2009 on the right. She has known occluded SFAs bilaterally. Her most recent lower extremity arterial Doppler studies performed 09/13/12 revealed ABIs in the 0.6 range bilaterally with moderate in-stent restenosis of right greater than left an occluded SFAs. She really denies significant lifestyle limiting claudication. We will continue to follow these by duplex ultrasound.

## 2014-07-24 ENCOUNTER — Ambulatory Visit (HOSPITAL_COMMUNITY): Payer: Medicare Other

## 2014-07-24 ENCOUNTER — Ambulatory Visit (HOSPITAL_COMMUNITY): Admission: RE | Admit: 2014-07-24 | Payer: Medicare Other | Source: Ambulatory Visit

## 2014-07-24 ENCOUNTER — Ambulatory Visit (HOSPITAL_COMMUNITY)
Admission: RE | Admit: 2014-07-24 | Discharge: 2014-07-24 | Disposition: A | Discharge status: Home / Self Care | Payer: Medicare Other | Source: Ambulatory Visit | Attending: Nephrology | Admitting: Nephrology

## 2014-07-24 ENCOUNTER — Other Ambulatory Visit (HOSPITAL_COMMUNITY): Payer: Self-pay | Admitting: Nephrology

## 2014-07-24 DIAGNOSIS — N189 Chronic kidney disease, unspecified: Secondary | ICD-10-CM

## 2014-07-24 DIAGNOSIS — N289 Disorder of kidney and ureter, unspecified: Secondary | ICD-10-CM | POA: Insufficient documentation

## 2014-07-25 ENCOUNTER — Telehealth: Payer: Self-pay | Admitting: *Deleted

## 2014-07-25 DIAGNOSIS — I739 Peripheral vascular disease, unspecified: Secondary | ICD-10-CM

## 2014-07-25 DIAGNOSIS — I701 Atherosclerosis of renal artery: Secondary | ICD-10-CM

## 2014-07-25 NOTE — Telephone Encounter (Signed)
Message copied by Marella Bile on Thu Jul 25, 2014  4:42 PM ------      Message from: Runell Gess      Created: Wed Jul 24, 2014  3:21 PM       Increased LRA velocities. Repeat 6 months ------

## 2014-07-25 NOTE — Telephone Encounter (Signed)
Order placed for repeat lower extremity and renal dopplers

## 2014-11-11 DIAGNOSIS — I739 Peripheral vascular disease, unspecified: Secondary | ICD-10-CM | POA: Diagnosis not present

## 2014-11-11 DIAGNOSIS — I4891 Unspecified atrial fibrillation: Secondary | ICD-10-CM | POA: Diagnosis not present

## 2014-11-11 DIAGNOSIS — F17290 Nicotine dependence, other tobacco product, uncomplicated: Secondary | ICD-10-CM | POA: Diagnosis not present

## 2014-11-11 DIAGNOSIS — I10 Essential (primary) hypertension: Secondary | ICD-10-CM | POA: Diagnosis not present

## 2014-12-17 ENCOUNTER — Other Ambulatory Visit: Payer: Self-pay | Admitting: Cardiovascular Disease

## 2015-01-20 DIAGNOSIS — E785 Hyperlipidemia, unspecified: Secondary | ICD-10-CM | POA: Diagnosis not present

## 2015-01-20 DIAGNOSIS — I2510 Atherosclerotic heart disease of native coronary artery without angina pectoris: Secondary | ICD-10-CM | POA: Diagnosis not present

## 2015-01-20 DIAGNOSIS — I10 Essential (primary) hypertension: Secondary | ICD-10-CM | POA: Diagnosis not present

## 2015-01-20 DIAGNOSIS — J449 Chronic obstructive pulmonary disease, unspecified: Secondary | ICD-10-CM | POA: Diagnosis not present

## 2015-02-10 DIAGNOSIS — I739 Peripheral vascular disease, unspecified: Secondary | ICD-10-CM | POA: Diagnosis not present

## 2015-02-10 DIAGNOSIS — I4891 Unspecified atrial fibrillation: Secondary | ICD-10-CM | POA: Diagnosis not present

## 2015-02-10 DIAGNOSIS — R739 Hyperglycemia, unspecified: Secondary | ICD-10-CM | POA: Diagnosis not present

## 2015-02-10 DIAGNOSIS — I10 Essential (primary) hypertension: Secondary | ICD-10-CM | POA: Diagnosis not present

## 2015-02-10 DIAGNOSIS — F17200 Nicotine dependence, unspecified, uncomplicated: Secondary | ICD-10-CM | POA: Diagnosis not present

## 2015-02-10 DIAGNOSIS — J449 Chronic obstructive pulmonary disease, unspecified: Secondary | ICD-10-CM | POA: Diagnosis not present

## 2015-04-08 ENCOUNTER — Inpatient Hospital Stay (HOSPITAL_COMMUNITY)
Admission: EM | Admit: 2015-04-08 | Discharge: 2015-04-11 | DRG: 291 | Disposition: A | Discharge status: Home / Self Care | Payer: Medicare Other | Attending: Internal Medicine | Admitting: Internal Medicine

## 2015-04-08 ENCOUNTER — Emergency Department (HOSPITAL_COMMUNITY): Payer: Medicare Other

## 2015-04-08 ENCOUNTER — Inpatient Hospital Stay (HOSPITAL_COMMUNITY): Payer: Medicare Other

## 2015-04-08 ENCOUNTER — Encounter (HOSPITAL_COMMUNITY): Payer: Self-pay | Admitting: Emergency Medicine

## 2015-04-08 DIAGNOSIS — I482 Chronic atrial fibrillation: Secondary | ICD-10-CM | POA: Diagnosis not present

## 2015-04-08 DIAGNOSIS — J9601 Acute respiratory failure with hypoxia: Secondary | ICD-10-CM | POA: Diagnosis not present

## 2015-04-08 DIAGNOSIS — I509 Heart failure, unspecified: Secondary | ICD-10-CM | POA: Diagnosis not present

## 2015-04-08 DIAGNOSIS — Z8673 Personal history of transient ischemic attack (TIA), and cerebral infarction without residual deficits: Secondary | ICD-10-CM

## 2015-04-08 DIAGNOSIS — J40 Bronchitis, not specified as acute or chronic: Secondary | ICD-10-CM

## 2015-04-08 DIAGNOSIS — R739 Hyperglycemia, unspecified: Secondary | ICD-10-CM | POA: Diagnosis not present

## 2015-04-08 DIAGNOSIS — Z800 Family history of malignant neoplasm of digestive organs: Secondary | ICD-10-CM

## 2015-04-08 DIAGNOSIS — I5033 Acute on chronic diastolic (congestive) heart failure: Principal | ICD-10-CM

## 2015-04-08 DIAGNOSIS — J441 Chronic obstructive pulmonary disease with (acute) exacerbation: Secondary | ICD-10-CM

## 2015-04-08 DIAGNOSIS — Z8249 Family history of ischemic heart disease and other diseases of the circulatory system: Secondary | ICD-10-CM

## 2015-04-08 DIAGNOSIS — I4891 Unspecified atrial fibrillation: Secondary | ICD-10-CM | POA: Diagnosis not present

## 2015-04-08 DIAGNOSIS — E782 Mixed hyperlipidemia: Secondary | ICD-10-CM | POA: Diagnosis present

## 2015-04-08 DIAGNOSIS — R0600 Dyspnea, unspecified: Secondary | ICD-10-CM

## 2015-04-08 DIAGNOSIS — I4821 Permanent atrial fibrillation: Secondary | ICD-10-CM | POA: Diagnosis present

## 2015-04-08 DIAGNOSIS — I10 Essential (primary) hypertension: Secondary | ICD-10-CM

## 2015-04-08 DIAGNOSIS — Z720 Tobacco use: Secondary | ICD-10-CM | POA: Insufficient documentation

## 2015-04-08 DIAGNOSIS — I2510 Atherosclerotic heart disease of native coronary artery without angina pectoris: Secondary | ICD-10-CM | POA: Diagnosis not present

## 2015-04-08 DIAGNOSIS — N179 Acute kidney failure, unspecified: Secondary | ICD-10-CM

## 2015-04-08 DIAGNOSIS — J449 Chronic obstructive pulmonary disease, unspecified: Secondary | ICD-10-CM | POA: Diagnosis not present

## 2015-04-08 DIAGNOSIS — N184 Chronic kidney disease, stage 4 (severe): Secondary | ICD-10-CM | POA: Diagnosis present

## 2015-04-08 DIAGNOSIS — F17210 Nicotine dependence, cigarettes, uncomplicated: Secondary | ICD-10-CM | POA: Diagnosis not present

## 2015-04-08 DIAGNOSIS — T380X5A Adverse effect of glucocorticoids and synthetic analogues, initial encounter: Secondary | ICD-10-CM | POA: Diagnosis present

## 2015-04-08 DIAGNOSIS — R0602 Shortness of breath: Secondary | ICD-10-CM | POA: Diagnosis not present

## 2015-04-08 DIAGNOSIS — I129 Hypertensive chronic kidney disease with stage 1 through stage 4 chronic kidney disease, or unspecified chronic kidney disease: Secondary | ICD-10-CM | POA: Diagnosis present

## 2015-04-08 DIAGNOSIS — I5032 Chronic diastolic (congestive) heart failure: Secondary | ICD-10-CM

## 2015-04-08 DIAGNOSIS — Z716 Tobacco abuse counseling: Secondary | ICD-10-CM

## 2015-04-08 DIAGNOSIS — I739 Peripheral vascular disease, unspecified: Secondary | ICD-10-CM | POA: Diagnosis not present

## 2015-04-08 DIAGNOSIS — J9600 Acute respiratory failure, unspecified whether with hypoxia or hypercapnia: Secondary | ICD-10-CM | POA: Diagnosis present

## 2015-04-08 DIAGNOSIS — I701 Atherosclerosis of renal artery: Secondary | ICD-10-CM

## 2015-04-08 DIAGNOSIS — N189 Chronic kidney disease, unspecified: Secondary | ICD-10-CM | POA: Diagnosis present

## 2015-04-08 DIAGNOSIS — E785 Hyperlipidemia, unspecified: Secondary | ICD-10-CM

## 2015-04-08 HISTORY — DX: Chronic obstructive pulmonary disease, unspecified: J44.9

## 2015-04-08 HISTORY — DX: Acute respiratory failure, unspecified whether with hypoxia or hypercapnia: J96.00

## 2015-04-08 HISTORY — DX: Tobacco use: Z72.0

## 2015-04-08 LAB — CBC WITH DIFFERENTIAL/PLATELET
Basophils Absolute: 0 10*3/uL (ref 0.0–0.1)
Basophils Relative: 0 % (ref 0–1)
Eosinophils Absolute: 0.1 10*3/uL (ref 0.0–0.7)
Eosinophils Relative: 1 % (ref 0–5)
HCT: 37.7 % (ref 36.0–46.0)
Hemoglobin: 11.6 g/dL — ABNORMAL LOW (ref 12.0–15.0)
Lymphocytes Relative: 25 % (ref 12–46)
Lymphs Abs: 1.7 10*3/uL (ref 0.7–4.0)
MCH: 27.5 pg (ref 26.0–34.0)
MCHC: 30.8 g/dL (ref 30.0–36.0)
MCV: 89.3 fL (ref 78.0–100.0)
Monocytes Absolute: 0.5 10*3/uL (ref 0.1–1.0)
Monocytes Relative: 7 % (ref 3–12)
Neutro Abs: 4.5 10*3/uL (ref 1.7–7.7)
Neutrophils Relative %: 67 % (ref 43–77)
Platelets: 248 10*3/uL (ref 150–400)
RBC: 4.22 MIL/uL (ref 3.87–5.11)
RDW: 13.8 % (ref 11.5–15.5)
WBC: 6.7 10*3/uL (ref 4.0–10.5)

## 2015-04-08 LAB — MAGNESIUM: Magnesium: 2.1 mg/dL (ref 1.7–2.4)

## 2015-04-08 LAB — BRAIN NATRIURETIC PEPTIDE: B Natriuretic Peptide: 456 pg/mL — ABNORMAL HIGH (ref 0.0–100.0)

## 2015-04-08 LAB — BASIC METABOLIC PANEL
Anion gap: 9 (ref 5–15)
BUN: 49 mg/dL — ABNORMAL HIGH (ref 6–20)
CO2: 26 mmol/L (ref 22–32)
Calcium: 8.9 mg/dL (ref 8.9–10.3)
Chloride: 109 mmol/L (ref 101–111)
Creatinine, Ser: 2.31 mg/dL — ABNORMAL HIGH (ref 0.44–1.00)
GFR calc Af Amer: 21 mL/min — ABNORMAL LOW (ref >60)
GFR calc non Af Amer: 19 mL/min — ABNORMAL LOW (ref >60)
Glucose, Bld: 118 mg/dL — ABNORMAL HIGH (ref 65–99)
Potassium: 4.3 mmol/L (ref 3.5–5.1)
Sodium: 144 mmol/L (ref 135–145)

## 2015-04-08 LAB — GLUCOSE, CAPILLARY
GLUCOSE-CAPILLARY: 160 mg/dL — AB (ref 65–99)
Glucose-Capillary: 194 mg/dL — ABNORMAL HIGH (ref 65–99)

## 2015-04-08 LAB — TROPONIN I: Troponin I: <0.03 ng/mL (ref <0.031)

## 2015-04-08 MED ORDER — LISINOPRIL 10 MG PO TABS
10.0000 mg | ORAL_TABLET | Freq: Every day | ORAL | Status: DC
  Administered 2015-04-08 – 2015-04-10 (×3): 10 mg via ORAL
  Filled 2015-04-08 (×3): qty 1

## 2015-04-08 MED ORDER — ONDANSETRON HCL 4 MG PO TABS: 4.0000 mg | ORAL_TABLET | Freq: Four times a day (QID) | ORAL | Status: DC | PRN

## 2015-04-08 MED ORDER — GUAIFENESIN-DM 100-10 MG/5ML PO SYRP: 5.0000 mL | ORAL_SOLUTION | ORAL | Status: DC | PRN

## 2015-04-08 MED ORDER — APIXABAN 5 MG PO TABS
2.5000 mg | ORAL_TABLET | Freq: Two times a day (BID) | ORAL | Status: DC
  Administered 2015-04-08 – 2015-04-11 (×6): 2.5 mg via ORAL
  Filled 2015-04-08 (×6): qty 1

## 2015-04-08 MED ORDER — LISINOPRIL 10 MG PO TABS: 10.0000 mg | ORAL_TABLET | Freq: Every day | ORAL | Status: DC

## 2015-04-08 MED ORDER — MAGNESIUM CITRATE PO SOLN: 1.0000 | Freq: Once | ORAL | Status: AC | PRN

## 2015-04-08 MED ORDER — ACETAMINOPHEN 650 MG RE SUPP: 650.0000 mg | Freq: Four times a day (QID) | RECTAL | Status: DC | PRN

## 2015-04-08 MED ORDER — ALUM & MAG HYDROXIDE-SIMETH 200-200-20 MG/5ML PO SUSP: 30.0000 mL | Freq: Four times a day (QID) | ORAL | Status: DC | PRN

## 2015-04-08 MED ORDER — POTASSIUM CHLORIDE CRYS ER 20 MEQ PO TBCR
40.0000 meq | EXTENDED_RELEASE_TABLET | Freq: Two times a day (BID) | ORAL | Status: DC
  Filled 2015-04-08: qty 2

## 2015-04-08 MED ORDER — SODIUM CHLORIDE 0.9 % IJ SOLN
3.0000 mL | Freq: Two times a day (BID) | INTRAMUSCULAR | Status: DC
  Administered 2015-04-08 – 2015-04-11 (×4): 3 mL via INTRAVENOUS

## 2015-04-08 MED ORDER — TIOTROPIUM BROMIDE MONOHYDRATE 18 MCG IN CAPS
18.0000 ug | ORAL_CAPSULE | Freq: Every morning | RESPIRATORY_TRACT | Status: DC
  Administered 2015-04-09 – 2015-04-11 (×3): 18 ug via RESPIRATORY_TRACT
  Filled 2015-04-08: qty 5

## 2015-04-08 MED ORDER — HYDRALAZINE HCL 20 MG/ML IJ SOLN: 5.0000 mg | INTRAMUSCULAR | Status: DC | PRN

## 2015-04-08 MED ORDER — ALBUTEROL SULFATE (2.5 MG/3ML) 0.083% IN NEBU
2.5000 mg | INHALATION_SOLUTION | RESPIRATORY_TRACT | Status: DC | PRN
  Administered 2015-04-08: 2.5 mg via RESPIRATORY_TRACT

## 2015-04-08 MED ORDER — ACETAMINOPHEN 325 MG PO TABS
650.0000 mg | ORAL_TABLET | Freq: Four times a day (QID) | ORAL | Status: DC | PRN
Start: 2015-04-08 — End: 2015-04-11

## 2015-04-08 MED ORDER — HYDRALAZINE HCL 20 MG/ML IJ SOLN
10.0000 mg | Freq: Once | INTRAMUSCULAR | Status: AC
  Administered 2015-04-08: 10 mg via INTRAVENOUS
  Filled 2015-04-08: qty 1

## 2015-04-08 MED ORDER — SORBITOL 70 % SOLN: 30.0000 mL | Freq: Every day | Status: DC | PRN

## 2015-04-08 MED ORDER — HYDROCODONE-ACETAMINOPHEN 5-325 MG PO TABS: 1.0000 | ORAL_TABLET | ORAL | Status: DC | PRN

## 2015-04-08 MED ORDER — SODIUM CHLORIDE 0.9 % IJ SOLN
3.0000 mL | Freq: Two times a day (BID) | INTRAMUSCULAR | Status: DC
  Administered 2015-04-08: 3 mL via INTRAVENOUS

## 2015-04-08 MED ORDER — INSULIN ASPART 100 UNIT/ML ~~LOC~~ SOLN: 0.0000 [IU] | Freq: Every day | SUBCUTANEOUS | Status: DC

## 2015-04-08 MED ORDER — FUROSEMIDE 10 MG/ML IJ SOLN
60.0000 mg | Freq: Once | INTRAMUSCULAR | Status: AC
  Administered 2015-04-08: 60 mg via INTRAVENOUS
  Filled 2015-04-08: qty 6

## 2015-04-08 MED ORDER — SIMVASTATIN 20 MG PO TABS
20.0000 mg | ORAL_TABLET | Freq: Every evening | ORAL | Status: DC
  Administered 2015-04-08 – 2015-04-10 (×3): 20 mg via ORAL
  Filled 2015-04-08 (×3): qty 1

## 2015-04-08 MED ORDER — ASPIRIN EC 81 MG PO TBEC
81.0000 mg | DELAYED_RELEASE_TABLET | Freq: Every morning | ORAL | Status: DC
  Administered 2015-04-09 – 2015-04-11 (×3): 81 mg via ORAL
  Filled 2015-04-08 (×3): qty 1

## 2015-04-08 MED ORDER — AMLODIPINE BESYLATE 5 MG PO TABS
7.5000 mg | ORAL_TABLET | Freq: Every day | ORAL | Status: DC
  Administered 2015-04-08 – 2015-04-11 (×3): 7.5 mg via ORAL
  Filled 2015-04-08 (×2): qty 2

## 2015-04-08 MED ORDER — MORPHINE SULFATE 2 MG/ML IJ SOLN: 1.0000 mg | INTRAMUSCULAR | Status: DC | PRN

## 2015-04-08 MED ORDER — ONDANSETRON HCL 4 MG/2ML IJ SOLN: 4.0000 mg | Freq: Four times a day (QID) | INTRAMUSCULAR | Status: DC | PRN

## 2015-04-08 MED ORDER — POTASSIUM CHLORIDE CRYS ER 20 MEQ PO TBCR
40.0000 meq | EXTENDED_RELEASE_TABLET | Freq: Once | ORAL | Status: AC
  Administered 2015-04-08: 40 meq via ORAL
  Filled 2015-04-08: qty 2

## 2015-04-08 MED ORDER — TRAZODONE HCL 50 MG PO TABS: 25.0000 mg | ORAL_TABLET | Freq: Every evening | ORAL | Status: DC | PRN

## 2015-04-08 MED ORDER — ALBUTEROL SULFATE (2.5 MG/3ML) 0.083% IN NEBU
2.5000 mg | INHALATION_SOLUTION | RESPIRATORY_TRACT | Status: DC
  Administered 2015-04-08 – 2015-04-09 (×9): 2.5 mg via RESPIRATORY_TRACT
  Filled 2015-04-08 (×10): qty 3

## 2015-04-08 MED ORDER — AMLODIPINE BESYLATE 5 MG PO TABS
5.0000 mg | ORAL_TABLET | Freq: Every day | ORAL | Status: DC
  Filled 2015-04-08: qty 1

## 2015-04-08 MED ORDER — INSULIN ASPART 100 UNIT/ML ~~LOC~~ SOLN
0.0000 [IU] | Freq: Three times a day (TID) | SUBCUTANEOUS | Status: DC
  Administered 2015-04-08: 2 [IU] via SUBCUTANEOUS
  Administered 2015-04-09: 1 [IU] via SUBCUTANEOUS
  Administered 2015-04-09: 2 [IU] via SUBCUTANEOUS
  Administered 2015-04-10 (×3): 1 [IU] via SUBCUTANEOUS

## 2015-04-08 MED ORDER — CETYLPYRIDINIUM CHLORIDE 0.05 % MT LIQD
7.0000 mL | Freq: Two times a day (BID) | OROMUCOSAL | Status: DC
  Administered 2015-04-08 – 2015-04-10 (×3): 7 mL via OROMUCOSAL

## 2015-04-08 MED ORDER — SODIUM CHLORIDE 0.9 % IJ SOLN: 3.0000 mL | INTRAMUSCULAR | Status: DC | PRN

## 2015-04-08 MED ORDER — IPRATROPIUM-ALBUTEROL 0.5-2.5 (3) MG/3ML IN SOLN
3.0000 mL | Freq: Once | RESPIRATORY_TRACT | Status: AC
  Administered 2015-04-08: 3 mL via RESPIRATORY_TRACT
  Filled 2015-04-08: qty 3

## 2015-04-08 MED ORDER — METHYLPREDNISOLONE SODIUM SUCC 125 MG IJ SOLR
80.0000 mg | Freq: Once | INTRAMUSCULAR | Status: AC
  Administered 2015-04-08: 80 mg via INTRAVENOUS
  Filled 2015-04-08: qty 2

## 2015-04-08 MED ORDER — POLYETHYLENE GLYCOL 3350 17 G PO PACK
17.0000 g | PACK | Freq: Every day | ORAL | Status: DC | PRN
Start: 2015-04-08 — End: 2015-04-11

## 2015-04-08 MED ORDER — LABETALOL HCL 200 MG PO TABS
300.0000 mg | ORAL_TABLET | Freq: Two times a day (BID) | ORAL | Status: DC
  Administered 2015-04-08 – 2015-04-11 (×6): 300 mg via ORAL
  Filled 2015-04-08 (×6): qty 2

## 2015-04-08 MED ORDER — METHYLPREDNISOLONE SODIUM SUCC 125 MG IJ SOLR
60.0000 mg | Freq: Two times a day (BID) | INTRAMUSCULAR | Status: DC
  Administered 2015-04-08: 60 mg via INTRAVENOUS
  Filled 2015-04-08: qty 2

## 2015-04-08 MED ORDER — SODIUM CHLORIDE 0.9 % IV SOLN: 250.0000 mL | INTRAVENOUS | Status: DC | PRN

## 2015-04-08 MED ORDER — POTASSIUM CHLORIDE CRYS ER 20 MEQ PO TBCR
20.0000 meq | EXTENDED_RELEASE_TABLET | Freq: Two times a day (BID) | ORAL | Status: DC
  Administered 2015-04-09 – 2015-04-11 (×5): 20 meq via ORAL
  Filled 2015-04-08 (×5): qty 1

## 2015-04-08 MED ORDER — FLUTICASONE FUROATE-VILANTEROL 100-25 MCG/INH IN AEPB
1.0000 | INHALATION_SPRAY | Freq: Every day | RESPIRATORY_TRACT | Status: DC
  Administered 2015-04-09 – 2015-04-10 (×2): 1 via RESPIRATORY_TRACT

## 2015-04-08 MED ORDER — AMLODIPINE BESYLATE 5 MG PO TABS: 5.0000 mg | ORAL_TABLET | Freq: Every day | ORAL | Status: DC

## 2015-04-08 MED ORDER — CHLORHEXIDINE GLUCONATE 0.12 % MT SOLN
15.0000 mL | Freq: Two times a day (BID) | OROMUCOSAL | Status: DC
  Administered 2015-04-08 – 2015-04-09 (×2): 15 mL via OROMUCOSAL
  Filled 2015-04-08 (×3): qty 15

## 2015-04-08 MED ORDER — FUROSEMIDE 10 MG/ML IJ SOLN
60.0000 mg | Freq: Two times a day (BID) | INTRAMUSCULAR | Status: DC
  Administered 2015-04-08 – 2015-04-09 (×2): 60 mg via INTRAVENOUS
  Filled 2015-04-08 (×2): qty 6

## 2015-04-08 MED ORDER — AMLODIPINE BESYLATE 5 MG PO TABS
7.5000 mg | ORAL_TABLET | Freq: Every day | ORAL | Status: DC
  Filled 2015-04-08: qty 2

## 2015-04-08 NOTE — ED Notes (Signed)
Pt c/o bilateral ankle swelling since last week with sob since yesterday. Denies pain.

## 2015-04-08 NOTE — Progress Notes (Signed)
Per Dr Sherrie MustacheFisher norvasc, lisinopril and IV apresoline 10 mg given at this time.

## 2015-04-08 NOTE — H&P (Signed)
[facility] History and Physical  Bethany FitzRichetta N Milson BJY:[id number]:[id number] DOB: [birth year] DOA: 04/08/2015  Referring physician: Juleen Chinakohut PCP: [name],[name], MD   Chief Complaint: shortness of breath  HPI: [name] is a very pleasant  81 y.o. female with a history of tobacco abuse, hypertension, hyperlipidemia, atrial fibrillation on L at quest, peripheral vascular occlusive disease, renal artery stenosis, COPD, and chronic diastolic heart failure presents emergency department with chief complaint of worsening shortness of breath and lower extremity edema. Initial evaluation in the emergency department reveals acute respiratory failure with hypoxia and acute on chronic kidney failure.  Patient reports over the last several days she has noticed a worsening of her lower extremity edema. 2 days ago she noticed a worsening of her chronic shortness of breath. She is not on home oxygen but she does have scheduled inhalers as well as a rescue inhaler. She reports using her rescue inhaler more often the last 2 days with little improvement. Associated symptoms include worsening orthopnea. She denies chest pain palpitation headache dizziness syncope or near-syncope. She denies any cough fever chills recent travel or sick contacts. She denies abdominal pain nausea vomiting diarrhea constipation or melanoma. She denies dysuria hematuria frequency or urgency. He does continue to smoke.  Workup in the emergency department includes complete blood count significant for hemoglobin of 11.6, basic metabolic panel significant for creatinine of 2.31 serum glucose of 118. Initial troponin is negative, EKG with atrial fibrillation and chest x-ray with cardiomegaly and no active disease.   While in the emergency department she is afebrile hemodynamically stable with blood pressure at the high end of normal. Addition she ambulated in the hallway in the emergency room on room air and her oxygen saturation level  dropped to high 80s reportedly. The emergency department she is provided with 80 mg of Solu-Medrol 60 mg of Lasix intravenously as well as nebulizer. At the time of my exam she reports improvement in respiratory status.  Review of Systems:  10 point review of systems completed and all systems are negative except as indicated in the history of present illness  Past Medical History  Diagnosis Date  . Mixed hyperlipidemia   . Coronary artery disease     [facility]  . PVD (peripheral vascular disease)     Right ABI of 0.5 and left ABI 0.62, occluded bilateral SFAs  . Essential hypertension, benign   . DVT (deep venous thrombosis) 05/2008  . Bilateral iliac artery occlusion   . Aortic valve sclerosis   . Atrial fibrillation     History of GI bleed on Coumadin  . Stroke 03/2013    Reportedly mild  . Renal artery stenosis     70% right renal artery stenosis during angiography which I performed in 2009.  Marland Kitchen. Acute respiratory failure   . COPD (chronic obstructive pulmonary disease)   . Tobacco abuse    Past Surgical History  Procedure Laterality Date  . Iliac artery stent Left 05/21/2008    Dr. Allyson SabalBerry  . Iliac artery stent Right 01/07/2009    Dr. Allyson SabalBerry  . Brain tumor excision  1999    Hemangioma [facility]  . Cataract extraction Bilateral   . Lens replacement Left   . Doppler echocardiography  02/12/2010    EF =>55%  . Nm myocar perf wall motion  02/12/2010    PERSANTINE: EF 57%;LV norm  . Lea doppler  09/13/2012    Right EIA stent >60%;rgt SFA occluded at prox. and mid level  w/reconstitution at distal SFA/pop.;rgt PTA occlude;lft EIAstent >50%;lft mid SFA occluded;lft ATA & PTA occluded  . Renal duplex doppler  02/25/2012    Right 60-99%;lft renal artery no evidence siginificant reduction;rgt & lft kidneys norm size    Social History:  reports that she has been smoking Cigarettes.  She has a 3.5 pack-year smoking history. She does not have any smokeless tobacco  history on file. She reports that she does not drink alcohol or use illicit drugs. She lives at home she is a retired Film/video editor she has 2 daughters both of whom live in [place] she is independent with ADLs No Known Allergies  Family History  Problem Relation Age of Onset  . Hypertension Mother   . Heart attack Father   . Heart disease Sister   . Colon cancer Sister   . Heart attack Brother      Prior to Admission medications   Medication Sig Start Date End Date Taking? Authorizing Provider  albuterol (PROVENTIL HFA;VENTOLIN HFA) 108 (90 BASE) MCG/ACT inhaler Inhale 2 puffs into the lungs every 6 (six) hours as needed for wheezing. 01/15/13  Yes Avon Gully, MD  amLODipine (NORVASC) 5 MG tablet TAKE ONE TABLET BY MOUTH DAILY. 12/17/14  Yes Runell Gess, MD  apixaban (ELIQUIS) 2.5 MG TABS tablet Take 2.5 mg by mouth 2 (two) times daily.   Yes Historical Provider, MD  aspirin EC 81 MG tablet Take 81 mg by mouth every morning.    Yes Historical Provider, MD  Fluticasone Furoate-Vilanterol (BREO ELLIPTA) 100-25 MCG/INH AEPB Inhale 1 puff into the lungs daily.   Yes Historical Provider, MD  furosemide (LASIX) 40 MG tablet Take 1 tablet (40 mg total) by mouth daily. 01/18/14  Yes Laqueta Linden, MD  labetalol (NORMODYNE) 300 MG tablet Take 300 mg by mouth 2 (two) times daily.  11/13/13  Yes Avon Gully, MD  lisinopril (PRINIVIL,ZESTRIL) 40 MG tablet Take 1 tablet (40 mg total) by mouth daily. 11/13/13  Yes Avon Gully, MD  potassium chloride SA (K-DUR,KLOR-CON) 20 MEQ tablet Take 2 tablets (40 mEq total) by mouth 2 (two) times daily. 11/13/13  Yes Avon Gully, MD  simvastatin (ZOCOR) 20 MG tablet Take 20 mg by mouth every evening.   Yes Historical Provider, MD  tiotropium (SPIRIVA) 18 MCG inhalation capsule Place 18 mcg into inhaler and inhale every morning. 01/15/13  Yes Avon Gully, MD  Vitamin D, Cholecalciferol, 1000 UNITS TABS Take 1 capsule by mouth daily.   Yes Historical  Provider, MD   Physical Exam: Filed Vitals:   04/08/15 1030 04/08/15 1112 04/08/15 1141 04/08/15 1200  BP: 162/89 184/82 172/90 167/91  Pulse:  59 63 58  Temp:      Resp: Height:      Weight:      SpO2:  100% 97% 96%    Wt Readings from Last 3 Encounters:  04/08/15 83.915 kg (185 lb)  07/19/14 81.829 kg (180 lb 6.4 oz)  05/24/14 80.287 kg (177 lb)    General:  Appears calm and comfortable Eyes: PERRL, normal lids, irises & conjunctiva,  ENT: grossly normal hearing, lips & tongue, mucous membranes of her mouth are moist and pink Neck: no LAD, masses or thyromegaly for range of motion Cardiovascular: Irregularly irregular, 2+ pitting edema to lower extremities  Respiratory: Normal effort with conversation. Breath sounds somewhat diminished in bases with improved airflow and upper lobes faint expiratory wheeze upper lobes as well as crackles bilaterally Abdomen:  Nondistended positive bowel sounds nontender to palpation Skin: no rash or induration seen on limited exam Musculoskeletal: grossly normal tone BUE/BLE Psychiatric: grossly normal mood and affect, speech fluent and appropriate Neurologic: grossly non-focal. Speech clear facial symmetry moves all extremities           Labs on Admission:  Basic Metabolic Panel:  Recent Labs Lab 04/08/15 0801  NA 144  K 4.3  CL 109  CO2 26  GLUCOSE 118*  BUN 49*  CREATININE 2.31*  CALCIUM 8.9   Liver Function Tests: No results for input(s): AST, ALT, ALKPHOS, BILITOT, PROT, ALBUMIN in the last 168 hours. No results for input(s): LIPASE, AMYLASE in the last 168 hours. No results for input(s): AMMONIA in the last 168 hours. CBC:  Recent Labs Lab 04/08/15 0801  WBC 6.7  NEUTROABS 4.5  HGB 11.6*  HCT 37.7  MCV 89.3  PLT 248   Cardiac Enzymes:  Recent Labs Lab 04/08/15 0801  TROPONINI <0.03    BNP (last 3 results)  Recent Labs  04/08/15 0801  BNP 456.0*    ProBNP (last 3 results) No results  for input(s): PROBNP in the last 8760 hours.  CBG: No results for input(s): GLUCAP in the last 168 hours.  Radiological Exams on Admission: Dg Chest 2 View  04/08/2015   CLINICAL DATA:  Shortness of breath since last night. Bilateral foot swelling for 1 week. Smoker.  EXAM: CHEST  2 VIEW  COMPARISON:  11/05/2013  FINDINGS: There is cardiomegaly. No confluent airspace opacities or effusions. No acute bony abnormality.  IMPRESSION: Cardiomegaly.  No active disease.   Electronically Signed   By: Charlett Nose M.D.   On: 04/08/2015 08:35    EKG: Independently reviewed chair fibrillation  Assessment/Plan Principal Problem:   Acute respiratory failure: Likely related to acute on chronic diastolic heart failure in the setting of mild COPD exacerbation. Patient is not on oxygen at home. Will admit to telemetry. Will continue IV Lasix, obtain a 2-D echo, continue beta blocker on its her intake and output obtain daily weights. Will continue oxygen supplementation and monitoring of her oxygen saturation levels. Active Problems:  Acute on chronic diastolic heart failure: Etiology uncertain. Chart review indicates echocardiogram 2014 showed vigorous LV systolic function and EF of 75-80% and grade 2 diastolic dysfunction with small to medium pericardial effusion. Home medications include oral Lasix 40 mg daily and she reports compliance. Continue Lasix intravenously and monitor intake and output. Will also obtain a 2-D echo. Chart review indicates she was last seen by cardiology July of last year. Will request cardiology consult   Acute on chronic kidney failure: Current creatinine slightly above baseline. Likely related to #2 in the setting of renal artery stenosis. Hold any nephrotoxic monitor intake and output.  COPD (chronic obstructive pulmonary disease): With mild exacerbation. Will continue nebulizers and Solu-Medrol that was initiated in the emergency department. Will continue oxygen supplementation and  close monitoring of her oxygen saturation levels. Will continue home inhalers as well. See #1     Permanent atrial fibrillation: Rate controlled. On low-dose Eliquis secondary to a history of GI bleed.     HTN (hypertension): In the setting of 70% right renal artery stenosis. Home medications include Lasix, amlodipine, labetalol, lisinopril. Will hold lisinopril due to #3. IV Lasix as above. Will also provide when necessary hydralazine as indicated.    Dyslipidemia: Continue her home statin. Obtain a lipid panel         Renal artery stenosis: Chart review  indicates close outpatient follow-up with Dr. Gery Pray    Code Status: full DVT Prophylaxis: Family Communication: daughter Evangeline Dakin at bedside Disposition Plan: home when ready  Time spent: 65 minutes  [facility] Pager [phone]

## 2015-04-08 NOTE — ED Provider Notes (Signed)
CSN: [id number]     Arrival date & time 04/08/15  0730 History  This chart was scribed for Raeford Razor, MD by Tanda Rockers, ED Scribe. This patient was seen in room APA19/APA19 and the patient's care was started at 8:05 AM.    Chief Complaint  Patient presents with  . Shortness of Breath   The history is provided by the patient and a relative. No language interpreter was used.     HPI Comments: [name] is a 81 y.o. female with hx HTN, atrial fibrillation, and chronic diastolic heart failure, who presents to the Emergency Department complaining of shortness of breath x 1 day. Pt also complains of orthopnea and increased bilateral feet swelling that began 1 week ago. She states that these symptoms feel similar to previous heart failure exacerbation. Pt is being compliant with Lasix medication. Denies fever, chills, cough, or any other symptoms. Past history includes afib previously on coumadin but developed GI bleed and switched to low dose eliquis because of renal function, malignant HTN with renal artery stenosis likely playing some role, chronic diastolic HF and DVT.   Past Medical History  Diagnosis Date  . Mixed hyperlipidemia   . Coronary artery disease     Followed by Dr. Allyson Sabal with [facility]  . PVD (peripheral vascular disease)     Right ABI of 0.5 and left ABI 0.62, occluded bilateral SFAs  . Essential hypertension, benign   . DVT (deep venous thrombosis) 05/2008  . Bilateral iliac artery occlusion   . Aortic valve sclerosis   . Atrial fibrillation     History of GI bleed on Coumadin  . Stroke 03/2013    Reportedly mild  . Renal artery stenosis     70% right renal artery stenosis during angiography which I performed in 2009.   Past Surgical History  Procedure Laterality Date  . Iliac artery stent Left 05/21/2008    Dr. Allyson Sabal  . Iliac artery stent Right 01/07/2009    Dr. Allyson Sabal  . Brain tumor excision  1999    Hemangioma [facility])  . Cataract extraction Bilateral    . Lens replacement Left   . Doppler echocardiography  02/12/2010    EF =>55%  . Nm myocar perf wall motion  02/12/2010    PERSANTINE: EF 57%;LV norm  . Lea doppler  09/13/2012    Right EIA stent >60%;rgt SFA occluded at prox. and mid level w/reconstitution at distal SFA/pop.;rgt PTA occlude;lft EIAstent >50%;lft mid SFA occluded;lft ATA & PTA occluded  . Renal duplex doppler  02/25/2012    Right 60-99%;lft renal artery no evidence siginificant reduction;rgt & lft kidneys norm size    Family History  Problem Relation Age of Onset  . Hypertension Mother   . Heart attack Father   . Heart disease Sister   . Colon cancer Sister   . Heart attack Brother    History  Substance Use Topics  . Smoking status: Current Every Day Smoker -- 0.10 packs/day for 35 years    Types: Cigarettes  . Smokeless tobacco: Not on file     Comment: stopped 29 days ago as of 12-04-13  . Alcohol Use: No   OB History    No data available     Review of Systems  All other systems reviewed and are negative.     Allergies  Review of patient's allergies indicates no known allergies.  Home Medications   Prior to Admission medications   Medication Sig Start Date End Date Taking?  Authorizing Provider  albuterol (PROVENTIL HFA;VENTOLIN HFA) 108 (90 BASE) MCG/ACT inhaler Inhale 2 puffs into the lungs every 6 (six) hours as needed for wheezing. 01/15/13   Avon Gully, MD  amLODipine (NORVASC) 5 MG tablet TAKE ONE TABLET BY MOUTH DAILY. 12/17/14   Runell Gess, MD  apixaban (ELIQUIS) 2.5 MG TABS tablet Take 2.5 mg by mouth 2 (two) times daily.    Historical Provider, MD  aspirin EC 81 MG tablet Take 81 mg by mouth every morning.     Historical Provider, MD  Fluticasone Furoate-Vilanterol (BREO ELLIPTA) 100-25 MCG/INH AEPB Inhale 1 puff into the lungs daily.    Historical Provider, MD  furosemide (LASIX) 40 MG tablet Take 1 tablet (40 mg total) by mouth daily. 01/18/14   Laqueta Linden, MD  labetalol  (NORMODYNE) 300 MG tablet Take 600 mg by mouth 2 (two) times daily. 11/13/13   Avon Gully, MD  lisinopril (PRINIVIL,ZESTRIL) 40 MG tablet Take 1 tablet (40 mg total) by mouth daily. 11/13/13   Avon Gully, MD  potassium chloride SA (K-DUR,KLOR-CON) 20 MEQ tablet Take 2 tablets (40 mEq total) by mouth 2 (two) times daily. 11/13/13   Avon Gully, MD  simvastatin (ZOCOR) 20 MG tablet Take 20 mg by mouth every evening.    Historical Provider, MD  tiotropium (SPIRIVA) 18 MCG inhalation capsule Place 18 mcg into inhaler and inhale every morning. 01/15/13   Avon Gully, MD   Triage Vitals: Pulse 67  Temp(Src) 97.6 F (36.4 C)  Resp 28  Ht 5' 7.5" (1.715 m)  Wt 185 lb (83.915 kg)  BMI 28.53 kg/m2  SpO2 94%   Physical Exam  Constitutional: She is oriented to person, place, and time. She appears well-developed and well-nourished. No distress.  HENT:  Head: Normocephalic and atraumatic.  Eyes: Conjunctivae and EOM are normal.  Neck: Neck supple. No tracheal deviation present.  Cardiovascular: Normal rate.  An irregularly irregular rhythm present.  Pulmonary/Chest: Effort normal. Tachypnea noted. No respiratory distress. She has wheezes (Mild. ).  Musculoskeletal: Normal range of motion. She exhibits edema.  Pitting lower extremity edema, right worse than left.   Neurological: She is alert and oriented to person, place, and time.  Skin: Skin is warm and dry.  Psychiatric: She has a normal mood and affect. Her behavior is normal.  Nursing note and vitals reviewed.   ED Course  Procedures (including critical care time)  DIAGNOSTIC STUDIES: Oxygen Saturation is 94% on RA, low by my interpretation.    COORDINATION OF CARE: 8:09 AM-Discussed treatment plan which includes breathing treatment, Lasix, and CXR with pt at bedside and pt agreed to plan.   Labs Review Labs Reviewed  CBC WITH DIFFERENTIAL/PLATELET - Abnormal; Notable for the following:    Hemoglobin 11.6 (*)    All other  components within normal limits  BASIC METABOLIC PANEL - Abnormal; Notable for the following:    Glucose, Bld 118 (*)    BUN 49 (*)    Creatinine, Ser 2.31 (*)    GFR calc non Af Amer 19 (*)    GFR calc Af Amer 21 (*)    All other components within normal limits  BRAIN NATRIURETIC PEPTIDE - Abnormal; Notable for the following:    B Natriuretic Peptide 456.0 (*)    All other components within normal limits    Imaging Review Dg Chest 2 View  04/08/2015   CLINICAL DATA:  Shortness of breath since last night. Bilateral foot swelling for 1 week. Smoker.  EXAM: CHEST  2 VIEW  COMPARISON:  11/05/2013  FINDINGS: There is cardiomegaly. No confluent airspace opacities or effusions. No acute bony abnormality.  IMPRESSION: Cardiomegaly.  No active disease.   Electronically Signed   By: Charlett NoseKevin  Dover M.D.   On: 04/08/2015 08:35     EKG Interpretation   Date/Time:  Tuesday Apr 08 2015 07:49:44 EDT Ventricular Rate:  66 PR Interval:    QRS Duration: 91 QT Interval:  461 QTC Calculation: 483 R Axis:   74 Text Interpretation:  Atrial fibrillation Anterior infarct, old Confirmed  by Phenix Grein  MD, Denesia Donelan ([id number]) on 04/08/2015 7:53:15 AM      MDM   Final diagnoses:  Bronchitis  Chronic diastolic heart failure   84yF with dyspnea. Likely multifactorial. Hx of diastolic HF and increased peripheral edema recently. Sounds more wheezy on exam than crackles though. CXR w/o focal infiltrate or overt edema. Afebrile. No leukocytosis. Given IV lasix, nebs and steroids. Ambulated and pulse oximetry in high 80s on RA. No baseline oxygen requirement. Denies CP. EKG with afib. Hx of same. Rate controlled.Will discuss with medicine for further management.   I personally preformed the services scribed in my presence. The recorded information has been reviewed is accurate. Raeford RazorStephen Fraidy Mccarrick, MD.      Raeford RazorStephen Tori Dattilio, MD 04/09/15 [phone]

## 2015-04-08 NOTE — ED Notes (Signed)
Pt ambulated with walker around nurses station with sats 88-91% on room air and pulse at 65-70. Pt c/o being "out of breath". MD aware.

## 2015-04-08 NOTE — ED Notes (Signed)
Attempted report x 1. RN states that she will call back shortly.

## 2015-04-08 NOTE — Progress Notes (Signed)
Patients blood pressure elevated. Dr. Purvis SheffieldKoneswaran notified. Will give tonight's dose of 7.5 of Norvasc now and a one time dose of hydralazine as ordered by physician.

## 2015-04-08 NOTE — Consult Note (Signed)
CARDIOLOGY CONSULT NOTE  Patient ID: [name] MRN: [id number] DOB/AGE: Feb 25, 1931 81 y.o.  Admit date: 04/08/2015 Primary Physician [name],[name], MD  Reason for Consultation: CHF  HPI: The patient is an 81 year old woman who is a patient of mine whom I most recently evaluated in clinic in July 2015. She has a history of chronic diastolic heart failure, CKD stage IV, tobacco abuse, malignant hypertension, hyperlipidemia, atrial fibrillation, and peripheral vascular occlusive disease. She has a known 70% right renal artery stenosis, as well as bilateral iliac and SFA disease. She has known occluded SFAs bilaterally and has had bilateral iliac stenting for chronic total occlusions in 2009 and 2010 and is followed by Dr. Allyson SabalBerry for this.   She had a negative Myoview, February 12, 2010, and had an echocardiogram in 10/2013 which showed vigorous LV systolic function, EF 75-80%, with near obliteration of the left ventricular cavity during systole and a gradient of 30-40 mm mercury, along with moderately elevated pulmonary pressures, grade II diastolic dysfunction, and a small to medium size pericardial effusion, with severe LAE and moderate RAE. Most recent Dopplers reveal ABIs in the 0.5 to 0.6 range bilaterally with progression of disease in her right external iliac artery, suggesting "in-stent restenosis," as well as her left external iliac artery. She also has atrial fibrillation for which she takes Eliquis. She had less than 50% bilateral internal carotid artery stenosis in 01/2013. She also has COPD.   Beginning last week, she began to have progressive bilateral ankle and feet swelling. In the past few days, she began to experience worsening of baseline shortness of breath. She has used her prn inhaler recently with mild improvement in symptoms. Denies chest pain and palpitations, but has been orthopneic for the past few days. Denies fevers and chills.  She continues to smoke 1 pack  of cigarettes daily.  Her daughter, [name], [place] from [place]. Her daughter, [name], will [place] coming in from [place].    No Known Allergies  Current Facility-Administered Medications  Medication Dose Route Frequency Provider Last Rate Last Dose  . 0.9 %  sodium chloride infusion  250 mL Intravenous PRN Gwenyth BenderKaren M Black, NP      . acetaminophen (TYLENOL) tablet 650 mg  650 mg Oral Q6H PRN Gwenyth BenderKaren M Black, NP       Or  . acetaminophen (TYLENOL) suppository 650 mg  650 mg Rectal Q6H PRN Gwenyth BenderKaren M Black, NP      . albuterol (PROVENTIL) (2.5 MG/3ML) 0.083% nebulizer solution 2.5 mg  2.5 mg Nebulization Q2H PRN Gwenyth BenderKaren M Black, NP   2.5 mg at 04/08/15 1408  . albuterol (PROVENTIL) (2.5 MG/3ML) 0.083% nebulizer solution 2.5 mg  2.5 mg Nebulization Q4H Lesle ChrisKaren M Black, NP   2.5 mg at 04/08/15 1300  . alum & mag hydroxide-simeth (MAALOX/MYLANTA) 200-200-20 MG/5ML suspension 30 mL  30 mL Oral Q6H PRN Gwenyth BenderKaren M Black, NP      . amLODipine (NORVASC) tablet 5 mg  5 mg Oral Daily Lesle ChrisKaren M Black, NP      . antiseptic oral rinse (CPC / CETYLPYRIDINIUM CHLORIDE 0.05%) solution 7 mL  7 mL Mouth Rinse q12n4p Avon Gullyesfaye Fanta, MD   7 mL at 04/08/15 1600  . apixaban (ELIQUIS) tablet 2.5 mg  2.5 mg Oral BID Gwenyth BenderKaren M Black, NP      . Melene Muller[START ON 04/09/2015] aspirin EC tablet 81 mg  81 mg Oral q morning - 10a Gwenyth BenderKaren M Black, NP      .  chlorhexidine (PERIDEX) 0.12 % solution 15 mL  15 mL Mouth Rinse BID Avon Gully, MD      . Fluticasone Furoate-Vilanterol 100-25 MCG/INH AEPB 1 puff  1 puff Inhalation Daily Gwenyth Bender, NP   1 puff at 04/08/15 1300  . furosemide (LASIX) injection 60 mg  60 mg Intravenous Q12H Lesle Chris Black, NP      . guaiFENesin-dextromethorphan (ROBITUSSIN DM) 100-10 MG/5ML syrup 5 mL  5 mL Oral Q4H PRN Gwenyth Bender, NP      . HYDROcodone-acetaminophen (NORCO/VICODIN) 5-325 MG per tablet 1-2 tablet  1-2 tablet Oral Q4H PRN Gwenyth Bender, NP      . labetalol (NORMODYNE) tablet 300 mg  300 mg Oral BID  Gwenyth Bender, NP   0 mg at 04/08/15 1256  . magnesium citrate solution 1 Bottle  1 Bottle Oral Once PRN Gwenyth Bender, NP      . methylPREDNISolone sodium succinate (SOLU-MEDROL) 125 mg/2 mL injection 60 mg  60 mg Intravenous Q12H Lesle Chris Black, NP      . morphine 2 MG/ML injection 1 mg  1 mg Intravenous Q4H PRN Gwenyth Bender, NP      . ondansetron [facility]) tablet 4 mg  4 mg Oral Q6H PRN Gwenyth Bender, NP       Or  . ondansetron [facility]) injection 4 mg  4 mg Intravenous Q6H PRN Lesle Chris Black, NP      . polyethylene glycol (MIRALAX / GLYCOLAX) packet 17 g  17 g Oral Daily PRN Gwenyth Bender, NP      . potassium chloride SA (K-DUR,KLOR-CON) CR tablet 40 mEq  40 mEq Oral BID Gwenyth Bender, NP   40 mEq at 04/08/15 1522  . simvastatin (ZOCOR) tablet 20 mg  20 mg Oral QPM Lesle Chris Black, NP      . sodium chloride 0.9 % injection 3 mL  3 mL Intravenous Q12H Lesle Chris Black, NP   3 mL at 04/08/15 1300  . sodium chloride 0.9 % injection 3 mL  3 mL Intravenous Q12H Lesle Chris Black, NP   3 mL at 04/08/15 1300  . sodium chloride 0.9 % injection 3 mL  3 mL Intravenous PRN Gwenyth Bender, NP      . sorbitol 70 % solution 30 mL  30 mL Oral Daily PRN Gwenyth Bender, NP      . tiotropium [facility]) inhalation capsule 18 mcg  18 mcg Inhalation q morning - 10a Gwenyth Bender, NP   18 mcg at 04/08/15 1400  . traZODone (DESYREL) tablet 25 mg  25 mg Oral QHS PRN Gwenyth Bender, NP        Past Medical History  Diagnosis Date  . Mixed hyperlipidemia   . Coronary artery disease     Followed [facility] Dr. Allyson Sabal [facility]  . PVD (peripheral vascular disease)     Right ABI of 0.5 and left ABI 0.62, occluded bilateral SFAs  . Essential hypertension, benign   . DVT (deep venous thrombosis) 05/2008  . Bilateral iliac artery occlusion   . Aortic valve sclerosis   . Atrial fibrillation     History of GI bleed on Coumadin  . Stroke 03/2013    Reportedly mild  . Renal artery stenosis     70% right renal artery stenosis during  angiography which I performed in 2009.  Marland Kitchen Acute respiratory failure   . COPD (chronic obstructive pulmonary disease)   . Tobacco  abuse     Past Surgical History  Procedure Laterality Date  . Iliac artery stent Left 05/21/2008    Dr. Allyson Sabal  . Iliac artery stent Right 01/07/2009    Dr. Allyson Sabal  . Brain tumor excision  1999    Hemangioma [facility])  . Cataract extraction Bilateral   . Lens replacement Left   . Doppler echocardiography  02/12/2010    EF =>55%  . Nm myocar perf wall motion  02/12/2010    PERSANTINE: EF 57%;LV norm  . Lea doppler  09/13/2012    Right EIA stent >60%;rgt SFA occluded at prox. and mid level w/reconstitution at distal SFA/pop.;rgt PTA occlude;lft EIAstent >50%;lft mid SFA occluded;lft ATA & PTA occluded  . Renal duplex doppler  02/25/2012    Right 60-99%;lft renal artery no evidence siginificant reduction;rgt & lft kidneys norm size     History   Social History  . Marital Status: Widowed    Spouse Name: N/A  . Number of Children: 2  . Years of Education: N/A   Occupational History  .     Social History Main Topics  . Smoking status: Current Every Day Smoker -- 1.00 packs/day for 35 years    Types: Cigarettes  . Smokeless tobacco: Not on file     Comment: stopped 29 days ago as of 12-04-13  . Alcohol Use: No  . Drug Use: No  . Sexual Activity: No   Other Topics Concern  . Not on file   Social History Narrative     No family history of premature CAD in 1st degree relatives.  Prior to Admission medications   Medication Sig Start Date End Date Taking? Authorizing Provider  albuterol (PROVENTIL HFA;VENTOLIN HFA) 108 (90 BASE) MCG/ACT inhaler Inhale 2 puffs into the lungs every 6 (six) hours as needed for wheezing. 01/15/13  Yes Avon Gully, MD  amLODipine (NORVASC) 5 MG tablet TAKE ONE TABLET BY MOUTH DAILY. 12/17/14  Yes Runell Gess, MD  apixaban (ELIQUIS) 2.5 MG TABS tablet Take 2.5 mg by mouth 2 (two) times daily.   Yes Historical  Provider, MD  aspirin EC 81 MG tablet Take 81 mg by mouth every morning.    Yes Historical Provider, MD  Fluticasone Furoate-Vilanterol (BREO ELLIPTA) 100-25 MCG/INH AEPB Inhale 1 puff into the lungs daily.   Yes Historical Provider, MD  furosemide (LASIX) 40 MG tablet Take 1 tablet (40 mg total) by mouth daily. 01/18/14  Yes Laqueta Linden, MD  labetalol (NORMODYNE) 300 MG tablet Take 300 mg by mouth 2 (two) times daily.  11/13/13  Yes Avon Gully, MD  lisinopril (PRINIVIL,ZESTRIL) 40 MG tablet Take 1 tablet (40 mg total) by mouth daily. 11/13/13  Yes Avon Gully, MD  potassium chloride SA (K-DUR,KLOR-CON) 20 MEQ tablet Take 2 tablets (40 mEq total) by mouth 2 (two) times daily. 11/13/13  Yes Avon Gully, MD  simvastatin (ZOCOR) 20 MG tablet Take 20 mg by mouth every evening.   Yes Historical Provider, MD  tiotropium (SPIRIVA) 18 MCG inhalation capsule Place 18 mcg into inhaler and inhale every morning. 01/15/13  Yes Avon Gully, MD  Vitamin D, Cholecalciferol, 1000 UNITS TABS Take 1 capsule by mouth daily.   Yes Historical Provider, MD     Review of systems complete and found to be negative unless listed above in HPI     Physical exam Blood pressure 195/89, pulse 52, temperature 97.8 F (36.6 C), temperature source Oral, resp. rate 18, height 5\' 7"  (1.702 m), weight 174  lb 8 oz (79.153 kg), SpO2 99 %. General: NAD Neck: No JVD, no thyromegaly or thyroid nodule.  Lungs: Markedly prolonged expiratory phase with expiratory wheezes, no rales. CV: Regular rate, irregular rhythm, normal S1/S2, no S3, no murmur.  Dorsal pedal and periankle edema noted.  Abdomen: Soft, nontender, no distention.  Skin: Intact without lesions or rashes.  Neurologic: Alert and oriented x 3.  Psych: Normal affect. Extremities: No clubbing or cyanosis.  HEENT: Normal.   ECG: Rate-controlled atrial fibrillation, late R wave transition, nonspecific T wave abnormality with TWI in V6 (old).  Labs:   Lab  Results  Component Value Date   WBC 6.7 04/08/2015   HGB 11.6* 04/08/2015   HCT 37.7 04/08/2015   MCV 89.3 04/08/2015   PLT 248 04/08/2015    Recent Labs Lab 04/08/15 0801  NA 144  K 4.3  CL 109  CO2 26  BUN 49*  CREATININE 2.31*  CALCIUM 8.9  GLUCOSE 118*   Lab Results  Component Value Date   TROPONINI <0.03 04/08/2015    Lab Results  Component Value Date   CHOL  05/25/2008    76        ATP III CLASSIFICATION:  <200     mg/dL   Desirable  960-454  mg/dL   Borderline High  >=098    mg/dL   High   Lab Results  Component Value Date   HDL 37* 05/25/2008   Lab Results  Component Value Date   LDLCALC  05/25/2008    28        Total Cholesterol/HDL:CHD Risk Coronary Heart Disease Risk Table                     Men   Women  1/2 Average Risk   3.4   3.3   Lab Results  Component Value Date   TRIG 54 05/25/2008   Lab Results  Component Value Date   CHOLHDL 2.1 05/25/2008   No results found for: LDLDIRECT       Studies: Dg Chest 2 View  04/08/2015   CLINICAL DATA:  Shortness of breath since last night. Bilateral foot swelling for 1 week. Smoker.  EXAM: CHEST  2 VIEW  COMPARISON:  11/05/2013  FINDINGS: There is cardiomegaly. No confluent airspace opacities or effusions. No acute bony abnormality.  IMPRESSION: Cardiomegaly.  No active disease.   Electronically Signed   By: Charlett Nose M.D.   On: 04/08/2015 08:35    ASSESSMENT AND PLAN:  1. Acute hypoxic respiratory failure which is multifactorial in etiology, and secondary to malignant hypertension with consequent acute on chronic diastolic heart failure and a concomitant COPD exacerbation which may actually be the inciting factor: She has received both Solu-Medrol and IV Lasix and her symptoms have significantly improved. Wt 174 lbs 8 oz today, 177 lbs in my office in 05/2014. I will increase amlodipine to 7.5 mg daily for more optimal BP control. Continue IV Lasix 60 mg bid but would be cautious not to  over-diurese given both CKD stage IV and left ventricular mid-cavity gradient with small LV cavity size. Will follow up echocardiogram already ordered. Continue nebulizers and steroids.  2. Atrial fibrillation: HR controlled on labetalol. Continue reduced-dose apixaban for anticoagulation.  3. PVD: Stable. Followed by Dr. Allyson Sabal.  4. Malignant HTN: Increase amlodipine to 7.5 mg daily.  5. Tobacco abuse: Cessation counseling provided, and also encouraged by daughter.  Signed: Prentice Docker, M.D., F.A.C.C.  04/08/2015, 3:32 PM

## 2015-04-08 NOTE — Progress Notes (Signed)
Patient able to get up to bedside commode easily. Discussed foley catheter order with Toya Smotherskaren Black NP. Will hold off on foley catheter. [facility] and [facility].

## 2015-04-08 NOTE — ED Notes (Signed)
Admitting NP at bedside with patient and family.

## 2015-04-08 NOTE — ED Notes (Signed)
Pt up to beside commode

## 2015-04-09 LAB — BASIC METABOLIC PANEL
Anion gap: 11 (ref 5–15)
BUN: 52 mg/dL — AB (ref 6–20)
CO2: 28 mmol/L (ref 22–32)
CREATININE: 2.28 mg/dL — AB (ref 0.44–1.00)
Calcium: 9.3 mg/dL (ref 8.9–10.3)
Chloride: 106 mmol/L (ref 101–111)
GFR calc Af Amer: 22 mL/min — ABNORMAL LOW (ref >60)
GFR, EST NON AFRICAN AMERICAN: 19 mL/min — AB (ref >60)
Glucose, Bld: 138 mg/dL — ABNORMAL HIGH (ref 65–99)
POTASSIUM: 4.8 mmol/L (ref 3.5–5.1)
SODIUM: 145 mmol/L (ref 135–145)

## 2015-04-09 LAB — HEPATIC FUNCTION PANEL
ALT: 18 U/L (ref 14–54)
AST: 16 U/L (ref 15–41)
Albumin: 3.3 g/dL — ABNORMAL LOW (ref 3.5–5.0)
Alkaline Phosphatase: 86 U/L (ref 38–126)
BILIRUBIN DIRECT: 0.1 mg/dL (ref 0.1–0.5)
BILIRUBIN INDIRECT: 0.4 mg/dL (ref 0.3–0.9)
TOTAL PROTEIN: 6.7 g/dL (ref 6.5–8.1)
Total Bilirubin: 0.5 mg/dL (ref 0.3–1.2)

## 2015-04-09 LAB — CBC
HCT: 41.7 % (ref 36.0–46.0)
HEMOGLOBIN: 12.8 g/dL (ref 12.0–15.0)
MCH: 27.3 pg (ref 26.0–34.0)
MCHC: 30.7 g/dL (ref 30.0–36.0)
MCV: 88.9 fL (ref 78.0–100.0)
Platelets: 257 10*3/uL (ref 150–400)
RBC: 4.69 MIL/uL (ref 3.87–5.11)
RDW: 13.8 % (ref 11.5–15.5)
WBC: 7.8 10*3/uL (ref 4.0–10.5)

## 2015-04-09 LAB — GLUCOSE, CAPILLARY
GLUCOSE-CAPILLARY: 125 mg/dL — AB (ref 65–99)
GLUCOSE-CAPILLARY: 158 mg/dL — AB (ref 65–99)
Glucose-Capillary: 128 mg/dL — ABNORMAL HIGH (ref 65–99)
Glucose-Capillary: 143 mg/dL — ABNORMAL HIGH (ref 65–99)

## 2015-04-09 LAB — TROPONIN I

## 2015-04-09 MED ORDER — FUROSEMIDE 10 MG/ML IJ SOLN
40.0000 mg | Freq: Two times a day (BID) | INTRAMUSCULAR | Status: DC
  Administered 2015-04-09 – 2015-04-10 (×2): 40 mg via INTRAVENOUS
  Filled 2015-04-09 (×2): qty 4

## 2015-04-09 MED ORDER — ALBUTEROL SULFATE (2.5 MG/3ML) 0.083% IN NEBU
2.5000 mg | INHALATION_SOLUTION | Freq: Four times a day (QID) | RESPIRATORY_TRACT | Status: DC
  Administered 2015-04-10 – 2015-04-11 (×5): 2.5 mg via RESPIRATORY_TRACT
  Filled 2015-04-09 (×5): qty 3

## 2015-04-09 MED ORDER — METHYLPREDNISOLONE SODIUM SUCC 40 MG IJ SOLR
40.0000 mg | Freq: Two times a day (BID) | INTRAMUSCULAR | Status: DC
  Administered 2015-04-09 – 2015-04-10 (×2): 40 mg via INTRAVENOUS
  Filled 2015-04-09 (×3): qty 1

## 2015-04-09 NOTE — Progress Notes (Signed)
Subjective:  Still short of breath and asking for her O2  Objective:  Vital Signs in the last 24 hours: Temp:  [97.8 F (36.6 C)-98.1 F (36.7 C)] 97.9 F (36.6 C) (06/01 0654) Pulse Rate:  [52-75] 65 (06/01 0654) Resp:  [16-24] 18 (06/01 0654) BP: (124-195)/(72-100) 187/89 mmHg (06/01 0654) SpO2:  [94 %-100 %] 100 % (06/01 0710) Weight:  [172 lb 11.2 oz (78.336 kg)-174 lb 8 oz (79.153 kg)] 172 lb 11.2 oz (78.336 kg) (06/01 0654)  Intake/Output from previous day: 05/31 0701 - 06/01 0700 In: 240 [P.O.:240] Out: 3275 [Urine:3275] Intake/Output from this shift: Total I/O In: 240 [P.O.:240] Out: -   Physical Exam: NECK:Increased JVD, HJR, no bruit LUNGS: rales at bases, wheezing anteriorly HEART: Irregular rate and rhythm, 2/6 systolic murmur apex,no gallop, rub, bruit, thrill, or heave EXTREMITIES: plus 1 edema bilaterally, Without cyanosis, clubbing Lab Results:  Recent Labs  04/08/15 0801 04/09/15 0626  WBC 6.7 7.8  HGB 11.6* 12.8  PLT 248 257    Recent Labs  04/08/15 0801 04/09/15 0626  NA 144 145  K 4.3 4.8  CL 109 106  CO2 26 28  GLUCOSE 118* 138*  BUN 49* 52*  CREATININE 2.31* 2.28*    Recent Labs  04/08/15 1609 04/09/15 0626  TROPONINI <0.03 <0.03   Hepatic Function Panel  Recent Labs  04/09/15 0626  PROT 6.7  ALBUMIN 3.3*  AST 16  ALT 18  ALKPHOS 86  BILITOT 0.5  BILIDIR 0.1  IBILI 0.4   No results for input(s): CHOL in the last 72 hours. No results for input(s): PROTIME in the last 72 hours.  Imaging: 2Decho 04/08/15: Study Conclusions  - Left ventricle: The cavity size was normal. Systolic function was   vigorous. The estimated ejection fraction was in the range of 65%   to 70%. Wall motion was normal; there were no regional wall   motion abnormalities. The study was not technically sufficient to   allow evaluation of LV diastolic dysfunction due to atrial   fibrillation. Moderate to severe concentric left ventricular  hypertrophy. - Mitral valve: Mildly thickened leaflets . There was mild to   moderate eccentric regurgitation. - Left atrium: The atrium was severely dilated. - Right ventricle: Systolic function was mildly reduced. - Right atrium: The atrium was moderately dilated. - Tricuspid valve: There was trivial regurgitation. - Inferior vena cava: The vessel was dilated. The respirophasic   diameter changes were blunted (< 50%), consistent with elevated   central venous pressure. - Impressions: Small to moderate size pericardial effusion which is   stable. No right-sided diastolic chamber collapse to suggest   tamponade physiology.  Impressions:  - Small to moderate size pericardial effusion which is stable. No   right-sided diastolic chamber collapse to suggest tamponade   physiology.   Assessment/Plan:   1. Acute hypoxic respiratory failure which is multifactorial in etiology, and secondary to malignant hypertension with consequent acute on chronic diastolic heart failure and a concomitant COPD exacerbation which may actually be the inciting factor: She has received both Solu-Medrol and IV Lasix and her symptoms have significantly improved.Negative 2735 cc yesterday. Wt 172lbs 11.2 oz today, 177 lbs in  office in 05/2014.  amlodipine increased yesterday to 7.5 mg daily for more optimal BP control. BP still up and down. Continue IV Lasix 60 mg bid but would be cautious not to over-diurese given both CKD stage IV and left ventricular mid-cavity gradient with small LV cavity size. Echo with normal LV  function, mod-severe LVH.Continue nebulizers and steroids.  2. Atrial fibrillation: HR controlled on labetalol. Continue reduced-dose apixaban for anticoagulation.  3. PVD: Stable. Followed by Dr. Allyson SabalBerry.  4. Malignant HTN: amlodipine increased yesterday to 7.5 mg daily.  5. Tobacco abuse: Cessation counseling provided, and also encouraged by daughter.  6. CKD: Crt 2.28: so far stable with IV  diuresis.      LOS: 1 day    Bethany ReedyMichele Holden 04/09/2015, 9:27 AM   The patient was seen and examined, and I agree with the assessment and plan as documented above, with modifications as noted below.  Pt doing much better today. Lungs are much more clear and ankles/feet less edematous. BP still elevated with only recent increase of amlodipine to 7.5 mg daily on 5/31. Would consider addition of oral hydralazine tid given CKD and hence potentially difficult to increase ACEI dose (also has renal artery stenosis). Otherwise, continue present management with IV Lasix, nebulizers, and Solu-Medrol.

## 2015-04-09 NOTE — Progress Notes (Signed)
Subjective: Patient was admitted yesterday due worsening shortness of breath and bilateral leg edema. Patient has diastolic dysfunction, COPD and atrial fibrillation. Patient continue to smoke tobacco.  Objective: Vital signs in last 24 hours: Temp:  [97.8 F (36.6 C)-98.1 F (36.7 C)] 97.9 F (36.6 C) (06/01 0654) Pulse Rate:  [52-75] 65 (06/01 0654) Resp:  [16-24] 18 (06/01 0654) BP: (124-195)/(67-100) 187/89 mmHg (06/01 0654) SpO2:  [94 %-100 %] 100 % (06/01 0710) Weight:  [78.336 kg (172 lb 11.2 oz)-79.153 kg (174 lb 8 oz)] 78.336 kg (172 lb 11.2 oz) (06/01 0654) Weight change:  Last BM Date: 04/07/15  Intake/Output from previous day: 05/31 0701 - 06/01 0700 In: 240 [P.O.:240] Out: 2975 [Urine:2975]  PHYSICAL EXAM General appearance: alert and no distress Resp: diminished breath sounds bilaterally and rhonchi bilaterally Cardio: irregularly irregular rhythm GI: soft, non-tender; bowel sounds normal; no masses,  no organomegaly Extremities: 2++ leg edema  Lab Results:  Results for orders placed or performed during the hospital encounter of 04/08/15 (from the past 48 hour(s))  CBC with Differential     Status: Abnormal   Collection Time: 04/08/15  8:01 AM  Result Value Ref Range   WBC 6.7 4.0 - 10.5 K/uL   RBC 4.22 3.87 - 5.11 MIL/uL   Hemoglobin 11.6 (L) 12.0 - 15.0 g/dL   HCT 37.7 36.0 - 46.0 %   MCV 89.3 78.0 - 100.0 fL   MCH 27.5 26.0 - 34.0 pg   MCHC 30.8 30.0 - 36.0 g/dL   RDW 13.8 11.5 - 15.5 %   Platelets 248 150 - 400 K/uL   Neutrophils Relative % 67 43 - 77 %   Neutro Abs 4.5 1.7 - 7.7 K/uL   Lymphocytes Relative 25 12 - 46 %   Lymphs Abs 1.7 0.7 - 4.0 K/uL   Monocytes Relative 7 3 - 12 %   Monocytes Absolute 0.5 0.1 - 1.0 K/uL   Eosinophils Relative 1 0 - 5 %   Eosinophils Absolute 0.1 0.0 - 0.7 K/uL   Basophils Relative 0 0 - 1 %   Basophils Absolute 0.0 0.0 - 0.1 K/uL  Basic metabolic panel     Status: Abnormal   Collection Time: 04/08/15  8:01 AM   Result Value Ref Range   Sodium 144 135 - 145 mmol/L   Potassium 4.3 3.5 - 5.1 mmol/L   Chloride 109 101 - 111 mmol/L   CO2 26 22 - 32 mmol/L   Glucose, Bld 118 (H) 65 - 99 mg/dL   BUN 49 (H) 6 - 20 mg/dL   Creatinine, Ser 2.31 (H) 0.44 - 1.00 mg/dL   Calcium 8.9 8.9 - 10.3 mg/dL   GFR calc non Af Amer 19 (L) >60 mL/min   GFR calc Af Amer 21 (L) >60 mL/min    Comment: (NOTE) The eGFR has been calculated using the CKD EPI equation. This calculation has not been validated in all clinical situations. eGFR's persistently <60 mL/min signify possible Chronic Kidney Disease.    Anion gap 9 5 - 15  Brain natriuretic peptide     Status: Abnormal   Collection Time: 04/08/15  8:01 AM  Result Value Ref Range   B Natriuretic Peptide 456.0 (H) 0.0 - 100.0 pg/mL  Troponin I     Status: None   Collection Time: 04/08/15  8:01 AM  Result Value Ref Range   Troponin I <0.03 <0.031 ng/mL    Comment:        NO INDICATION OF  MYOCARDIAL INJURY.   Magnesium     Status: None   Collection Time: 04/08/15  8:01 AM  Result Value Ref Range   Magnesium 2.1 1.7 - 2.4 mg/dL  Troponin I     Status: None   Collection Time: 04/08/15  4:09 PM  Result Value Ref Range   Troponin I <0.03 <0.031 ng/mL    Comment:        NO INDICATION OF MYOCARDIAL INJURY.   Glucose, capillary     Status: Abnormal   Collection Time: 04/08/15  4:55 PM  Result Value Ref Range   Glucose-Capillary 194 (H) 65 - 99 mg/dL  Glucose, capillary     Status: Abnormal   Collection Time: 04/08/15 10:24 PM  Result Value Ref Range   Glucose-Capillary 160 (H) 65 - 99 mg/dL   Comment 1 Notify RN    Comment 2 Document in Chart   CBC     Status: None   Collection Time: 04/09/15  6:26 AM  Result Value Ref Range   WBC 7.8 4.0 - 10.5 K/uL   RBC 4.69 3.87 - 5.11 MIL/uL   Hemoglobin 12.8 12.0 - 15.0 g/dL   HCT 41.7 36.0 - 46.0 %   MCV 88.9 78.0 - 100.0 fL   MCH 27.3 26.0 - 34.0 pg   MCHC 30.7 30.0 - 36.0 g/dL   RDW 13.8 11.5 - 15.5 %    Platelets 257 150 - 400 K/uL  Troponin I     Status: None   Collection Time: 04/09/15  6:26 AM  Result Value Ref Range   Troponin I <0.03 <0.031 ng/mL    Comment:        NO INDICATION OF MYOCARDIAL INJURY.   Basic metabolic panel     Status: Abnormal   Collection Time: 04/09/15  6:26 AM  Result Value Ref Range   Sodium 145 135 - 145 mmol/L   Potassium 4.8 3.5 - 5.1 mmol/L   Chloride 106 101 - 111 mmol/L   CO2 28 22 - 32 mmol/L   Glucose, Bld 138 (H) 65 - 99 mg/dL   BUN 52 (H) 6 - 20 mg/dL   Creatinine, Ser 2.28 (H) 0.44 - 1.00 mg/dL   Calcium 9.3 8.9 - 10.3 mg/dL   GFR calc non Af Amer 19 (L) >60 mL/min   GFR calc Af Amer 22 (L) >60 mL/min    Comment: (NOTE) The eGFR has been calculated using the CKD EPI equation. This calculation has not been validated in all clinical situations. eGFR's persistently <60 mL/min signify possible Chronic Kidney Disease.    Anion gap 11 5 - 15  Hepatic function panel     Status: Abnormal   Collection Time: 04/09/15  6:26 AM  Result Value Ref Range   Total Protein 6.7 6.5 - 8.1 g/dL   Albumin 3.3 (L) 3.5 - 5.0 g/dL   AST 16 15 - 41 U/L   ALT 18 14 - 54 U/L   Alkaline Phosphatase 86 38 - 126 U/L   Total Bilirubin 0.5 0.3 - 1.2 mg/dL   Bilirubin, Direct 0.1 0.1 - 0.5 mg/dL   Indirect Bilirubin 0.4 0.3 - 0.9 mg/dL  Glucose, capillary     Status: Abnormal   Collection Time: 04/09/15  7:27 AM  Result Value Ref Range   Glucose-Capillary 125 (H) 65 - 99 mg/dL   Comment 1 Notify RN     ABGS No results for input(s): PHART, PO2ART, TCO2, HCO3 in the last 72 hours.  Invalid  input(s): PCO2 CULTURES No results found for this or any previous visit (from the past 240 hour(s)). Studies/Results: Dg Chest 2 View  04/08/2015   CLINICAL DATA:  Shortness of breath since last night. Bilateral foot swelling for 1 week. Smoker.  EXAM: CHEST  2 VIEW  COMPARISON:  11/05/2013  FINDINGS: There is cardiomegaly. No confluent airspace opacities or effusions. No  acute bony abnormality.  IMPRESSION: Cardiomegaly.  No active disease.   Electronically Signed   By: Rolm Baptise M.D.   On: 04/08/2015 08:35    Medications: I have reviewed the patient's current medications.  Assesment:   Principal Problem:   Acute respiratory failure Active Problems:   Permanent atrial fibrillation   HTN (hypertension)   Dyslipidemia   Acute on chronic diastolic heart failure   Acute on chronic kidney failure   Renal artery stenosis   COPD exacerbation   Acute respiratory failure with hypoxia    Plan:  Medications reviewed Will taper steroid Will decrease lasix to 40 mg IV BID  Will monitor BMP Cardiology consult appreciated    LOS: 1 day   Masaji Billups 04/09/2015, 8:19 AM

## 2015-04-09 NOTE — Care Management Note (Signed)
Case Management Note  Patient Details  Name: Gentry FitzRichetta N Harig MRN: [id number] Date of Birth: June 17, 1931  Subjective/Objective:                  Pt admitted from home with respiratory failure. Pt lives alone and will return home at discharge. Pt is independent with ADL's. Pt has a neb machine for home use.   Action/Plan: Will continue to follow for discharge planning needs. Will need home O2 assessment prior to discharge.  Expected Discharge Date:  04/10/15               Expected Discharge Plan:  Home/Self Care  In-House Referral:  NA  Discharge planning Services  CM Consult  Post Acute Care Choice:  NA Choice offered to:  NA  DME Arranged:    DME Agency:     HH Arranged:    HH Agency:     Status of Service:  Completed, signed off  Medicare Important Message Given:    Date Medicare IM Given:    Medicare IM give by:    Date Additional Medicare IM Given:    Additional Medicare Important Message give by:     If discussed at Long Length of Stay Meetings, dates discussed:    Additional Comments:  Cheryl FlashBlackwell, Maliah Pyles Crowder, RN 04/09/2015, 2:20 PM

## 2015-04-09 NOTE — Progress Notes (Signed)
UR chart review completed.  

## 2015-04-10 LAB — GLUCOSE, CAPILLARY
GLUCOSE-CAPILLARY: 128 mg/dL — AB (ref 65–99)
GLUCOSE-CAPILLARY: 121 mg/dL — AB (ref 65–99)
Glucose-Capillary: 138 mg/dL — ABNORMAL HIGH (ref 65–99)
Glucose-Capillary: 134 mg/dL — ABNORMAL HIGH (ref 65–99)

## 2015-04-10 LAB — BASIC METABOLIC PANEL
ANION GAP: 9 (ref 5–15)
BUN: 60 mg/dL — ABNORMAL HIGH (ref 6–20)
CO2: 30 mmol/L (ref 22–32)
CREATININE: 2.28 mg/dL — AB (ref 0.44–1.00)
Calcium: 9 mg/dL (ref 8.9–10.3)
Chloride: 104 mmol/L (ref 101–111)
GFR calc Af Amer: 22 mL/min — ABNORMAL LOW (ref >60)
GFR calc non Af Amer: 19 mL/min — ABNORMAL LOW (ref >60)
GLUCOSE: 152 mg/dL — AB (ref 65–99)
Potassium: 4.8 mmol/L (ref 3.5–5.1)
Sodium: 143 mmol/L (ref 135–145)

## 2015-04-10 MED ORDER — HYDRALAZINE HCL 25 MG PO TABS: 50.0000 mg | ORAL_TABLET | Freq: Three times a day (TID) | ORAL | Status: DC

## 2015-04-10 MED ORDER — HYDRALAZINE HCL 25 MG PO TABS
25.0000 mg | ORAL_TABLET | Freq: Three times a day (TID) | ORAL | Status: DC
  Administered 2015-04-10 – 2015-04-11 (×3): 25 mg via ORAL
  Filled 2015-04-10 (×3): qty 1

## 2015-04-10 MED ORDER — FUROSEMIDE 40 MG PO TABS
40.0000 mg | ORAL_TABLET | Freq: Every day | ORAL | Status: DC
  Administered 2015-04-11: 40 mg via ORAL
  Filled 2015-04-10: qty 1

## 2015-04-10 NOTE — Progress Notes (Signed)
Subjective: Patient feels better today. Her breathing is improving. She is diuresing well.  Objective: Vital signs in last 24 hours: Temp:  [97.9 F (36.6 C)-98.6 F (37 C)] 97.9 F (36.6 C) (06/02 0459) Pulse Rate:  [63-72] 63 (06/02 0459) Resp:  [18-20] 18 (06/02 0459) BP: (169-178)/(83-106) 172/84 mmHg (06/02 0459) SpO2:  [92 %-100 %] 92 % (06/02 0723) Weight:  [77.52 kg (170 lb 14.4 oz)] 77.52 kg (170 lb 14.4 oz) (06/02 0459) Weight change: -6.396 kg (-14 lb 1.6 oz) Last BM Date: 04/09/15  Intake/Output from previous day: 06/01 0701 - 06/02 0700 In: 960 [P.O.:960] Out: 2100 [Urine:2100]  PHYSICAL EXAM General appearance: alert and no distress Resp: diminished breath sounds bilaterally and rhonchi bilaterally Cardio: irregularly irregular rhythm GI: soft, non-tender; bowel sounds normal; no masses,  no organomegaly Extremities: 2++ leg edema  Lab Results:  Results for orders placed or performed during the hospital encounter of 04/08/15 (from the past 48 hour(s))  Troponin I     Status: None   Collection Time: 04/08/15  4:09 PM  Result Value Ref Range   Troponin I <0.03 <0.031 ng/mL    Comment:        NO INDICATION OF MYOCARDIAL INJURY.   Glucose, capillary     Status: Abnormal   Collection Time: 04/08/15  4:55 PM  Result Value Ref Range   Glucose-Capillary 194 (H) 65 - 99 mg/dL  Glucose, capillary     Status: Abnormal   Collection Time: 04/08/15 10:24 PM  Result Value Ref Range   Glucose-Capillary 160 (H) 65 - 99 mg/dL   Comment 1 Notify RN    Comment 2 Document in Chart   CBC     Status: None   Collection Time: 04/09/15  6:26 AM  Result Value Ref Range   WBC 7.8 4.0 - 10.5 K/uL   RBC 4.69 3.87 - 5.11 MIL/uL   Hemoglobin 12.8 12.0 - 15.0 g/dL   HCT 41.7 36.0 - 46.0 %   MCV 88.9 78.0 - 100.0 fL   MCH 27.3 26.0 - 34.0 pg   MCHC 30.7 30.0 - 36.0 g/dL   RDW 13.8 11.5 - 15.5 %   Platelets 257 150 - 400 K/uL  Troponin I     Status: None   Collection Time:  04/09/15  6:26 AM  Result Value Ref Range   Troponin I <0.03 <0.031 ng/mL    Comment:        NO INDICATION OF MYOCARDIAL INJURY.   Basic metabolic panel     Status: Abnormal   Collection Time: 04/09/15  6:26 AM  Result Value Ref Range   Sodium 145 135 - 145 mmol/L   Potassium 4.8 3.5 - 5.1 mmol/L   Chloride 106 101 - 111 mmol/L   CO2 28 22 - 32 mmol/L   Glucose, Bld 138 (H) 65 - 99 mg/dL   BUN 52 (H) 6 - 20 mg/dL   Creatinine, Ser 2.28 (H) 0.44 - 1.00 mg/dL   Calcium 9.3 8.9 - 10.3 mg/dL   GFR calc non Af Amer 19 (L) >60 mL/min   GFR calc Af Amer 22 (L) >60 mL/min    Comment: (NOTE) The eGFR has been calculated using the CKD EPI equation. This calculation has not been validated in all clinical situations. eGFR's persistently <60 mL/min signify possible Chronic Kidney Disease.    Anion gap 11 5 - 15  Hepatic function panel     Status: Abnormal   Collection Time: 04/09/15  6:26  AM  Result Value Ref Range   Total Protein 6.7 6.5 - 8.1 g/dL   Albumin 3.3 (L) 3.5 - 5.0 g/dL   AST 16 15 - 41 U/L   ALT 18 14 - 54 U/L   Alkaline Phosphatase 86 38 - 126 U/L   Total Bilirubin 0.5 0.3 - 1.2 mg/dL   Bilirubin, Direct 0.1 0.1 - 0.5 mg/dL   Indirect Bilirubin 0.4 0.3 - 0.9 mg/dL  Glucose, capillary     Status: Abnormal   Collection Time: 04/09/15  7:27 AM  Result Value Ref Range   Glucose-Capillary 125 (H) 65 - 99 mg/dL   Comment 1 Notify RN   Glucose, capillary     Status: Abnormal   Collection Time: 04/09/15 11:48 AM  Result Value Ref Range   Glucose-Capillary 158 (H) 65 - 99 mg/dL   Comment 1 Notify RN   Glucose, capillary     Status: Abnormal   Collection Time: 04/09/15  4:48 PM  Result Value Ref Range   Glucose-Capillary 128 (H) 65 - 99 mg/dL   Comment 1 Notify RN   Glucose, capillary     Status: Abnormal   Collection Time: 04/09/15  9:14 PM  Result Value Ref Range   Glucose-Capillary 143 (H) 65 - 99 mg/dL   Comment 1 Notify RN    Comment 2 Document in Chart   Basic  metabolic panel     Status: Abnormal   Collection Time: 04/10/15  6:23 AM  Result Value Ref Range   Sodium 143 135 - 145 mmol/L   Potassium 4.8 3.5 - 5.1 mmol/L   Chloride 104 101 - 111 mmol/L   CO2 30 22 - 32 mmol/L   Glucose, Bld 152 (H) 65 - 99 mg/dL   BUN 60 (H) 6 - 20 mg/dL   Creatinine, Ser 2.28 (H) 0.44 - 1.00 mg/dL   Calcium 9.0 8.9 - 10.3 mg/dL   GFR calc non Af Amer 19 (L) >60 mL/min   GFR calc Af Amer 22 (L) >60 mL/min    Comment: (NOTE) The eGFR has been calculated using the CKD EPI equation. This calculation has not been validated in all clinical situations. eGFR's persistently <60 mL/min signify possible Chronic Kidney Disease.    Anion gap 9 5 - 15  Glucose, capillary     Status: Abnormal   Collection Time: 04/10/15  8:01 AM  Result Value Ref Range   Glucose-Capillary 138 (H) 65 - 99 mg/dL   Comment 1 Notify RN     ABGS No results for input(s): PHART, PO2ART, TCO2, HCO3 in the last 72 hours.  Invalid input(s): PCO2 CULTURES No results found for this or any previous visit (from the past 240 hour(s)). Studies/Results: No results found.  Medications: I have reviewed the patient's current medications.  Assesment:   Principal Problem:   Acute respiratory failure Active Problems:   Permanent atrial fibrillation   HTN (hypertension)   Dyslipidemia   Acute on chronic diastolic heart failure   Acute on chronic kidney failure   Renal artery stenosis   COPD exacerbation   Acute respiratory failure with hypoxia    Plan:  Medications reviewed Continue IV diuretics Nebulizer and IV steroid As per cardiology recommendation    LOS: 2 days   [name] 04/10/2015, 8:35 AM

## 2015-04-10 NOTE — Progress Notes (Signed)
Consulting cardiologist: Prentice Docker MD Primary Cardiologist: Prentice Docker MD  Cardiology Specific Problem List: 1. Chronic Diastolic CHF 2. Malignant Hypertension 3. Atrial fibrillation   Subjective:   Breathing better, less edema. No complaints of chest pain.    Objective:   Temp:  [97.9 F (36.6 C)-98.6 F (37 C)] 97.9 F (36.6 C) (06/02 0459) Pulse Rate:  [63-72] 63 (06/02 0459) Resp:  [18-20] 18 (06/02 0459) BP: (169-178)/(83-106) 172/84 mmHg (06/02 0459) SpO2:  [92 %-100 %] 92 % (06/02 0723) Weight:  [170 lb 14.4 oz (77.52 kg)] 170 lb 14.4 oz (77.52 kg) (06/02 0459) Last BM Date: 04/09/15  Filed Weights   04/08/15 1355 04/09/15 0654 04/10/15 0459  Weight: 174 lb 8 oz (79.153 kg) 172 lb 11.2 oz (78.336 kg) 170 lb 14.4 oz (77.52 kg)    Intake/Output Summary (Last 24 hours) at 04/10/15 1156 Last data filed at 04/10/15 0904  Gross per 24 hour  Intake    723 ml  Output   2100 ml  Net  -1377 ml    Telemetry: NSR  Exam:  General: No acute distress.  HEENT: Conjunctiva and lids normal, oropharynx clear. Tearing.   Lungs: Bilateral crackles, diminished on the right base. Inspiratory wheezes, on O2.  Cardiac: No elevated JVP or bruits. RRR distant, no gallop or rub.   Abdomen: Normoactive bowel sounds, nontender, nondistended.  Extremities: No pitting edema, distal pulses full.  Neuropsychiatric: Alert and oriented x3, affect appropriate.   Lab Results:  Basic Metabolic Panel:  Recent Labs Lab 04/08/15 0801 04/09/15 0626 04/10/15 0623  NA 144 145 143  K 4.3 4.8 4.8  CL 109 106 104  CO2 GLUCOSE 118* 138* 152*  BUN 49* 52* 60*  CREATININE 2.31* 2.28* 2.28*  CALCIUM 8.9 9.3 9.0  MG 2.1  --   --     Liver Function Tests:  Recent Labs Lab 04/09/15 0626  AST 16  ALT 18  ALKPHOS 86  BILITOT 0.5  PROT 6.7  ALBUMIN 3.3*    CBC:  Recent Labs Lab 04/08/15 0801 04/09/15 0626  WBC 6.7 7.8  HGB 11.6* 12.8  HCT  37.7 41.7  MCV 89.3 88.9  PLT 248 257    Cardiac Enzymes:  Recent Labs Lab 04/08/15 0801 04/08/15 1609 04/09/15 0626  TROPONINI <0.03 <0.03 <0.03    Medications:   Scheduled Medications: . albuterol  2.5 mg Nebulization Q6H  . amLODipine  7.5 mg Oral QHS  . antiseptic oral rinse  7 mL Mouth Rinse q12n4p  . apixaban  2.5 mg Oral BID  . aspirin EC  81 mg Oral q morning - 10a  . Fluticasone Furoate-Vilanterol  1 puff Inhalation Daily  . furosemide  40 mg Intravenous Q12H  . insulin aspart  0-5 Units Subcutaneous QHS  . insulin aspart  0-9 Units Subcutaneous TID WC  . labetalol  300 mg Oral BID  . lisinopril  10 mg Oral QHS  . methylPREDNISolone (SOLU-MEDROL) injection  40 mg Intravenous Q12H  . potassium chloride SA  20 mEq Oral BID  . simvastatin  20 mg Oral QPM  . sodium chloride  3 mL Intravenous Q12H  . sodium chloride  3 mL Intravenous Q12H  . tiotropium  18 mcg Inhalation q morning - 10a    Infusions:    PRN Medications: sodium chloride, acetaminophen **OR** acetaminophen, albuterol, alum & mag hydroxide-simeth, guaiFENesin-dextromethorphan, hydrALAZINE, HYDROcodone-acetaminophen, morphine injection, ondansetron **OR** ondansetron (ZOFRAN) IV, polyethylene glycol, sodium chloride, sorbitol,  traZODone   Assessment and Plan:   1. Acute on Chronic Diastolic CHF:  She has diuresed 3.9 liters since admission, with 2 liters overnight. Weight down from 174 lbs - 170 lbs since admission. Creatinine 2.28, CO2 30. Last weight in the office with Dr. Purvis SheffieldKoneswaran 177 lbs in  7/15. Uncertain of dry wt. She is feeling better with no edema.   2. Malignant Hypertension: BP remains elevated on multiple medications, now on amlodipine 7.5 mg daily, lisinopril 10 mg daily,and labetalol 300 mg BID. Will add hydralazine 50 mg po TID with parameters.   3. Atrial fibrillation: Heart rate is well controlled. Will not increase BB due to heart rate in the 60'-70's. BP will need to better  controlled to avoid cerebral bleeding issues with known history of coagulopathy on low dose apixaban.  4. PAD: Consider renal artery ultrasound with known history of occluded SFA';s bilaterally with stents in situ. She is followed by Dr. Allyson SabalBerry and was last seen in Sept of 2015.   Bettey MareKathryn M. Lawrence NP [facility]  04/10/2015, 11:56 AM   The patient was seen and examined, and I agree with the assessment and plan as documented above, with modifications as noted below.  Pt doing much better and appears at her baseline. Lungs are much more clear and ankles/feet are no longer edematous. BP still elevated with only recent increase of amlodipine to 7.5 mg daily on 5/31, but on high-dose Solu-Medrol. As her COPD exacerbation has resolved, I will d/c IV methylprednisolone.  BUN/SCr rising also indicative of euvolemic state. Will switch IV Lasix to oral regimen. Hydralazine has been added for more optimal BP control (25 mg tid).  Should be stable for discharge by tomorrow.

## 2015-04-11 LAB — BASIC METABOLIC PANEL
ANION GAP: 9 (ref 5–15)
BUN: 64 mg/dL — AB (ref 6–20)
CALCIUM: 8.8 mg/dL — AB (ref 8.9–10.3)
CO2: 30 mmol/L (ref 22–32)
CREATININE: 2.33 mg/dL — AB (ref 0.44–1.00)
Chloride: 105 mmol/L (ref 101–111)
GFR, EST AFRICAN AMERICAN: 21 mL/min — AB (ref >60)
GFR, EST NON AFRICAN AMERICAN: 18 mL/min — AB (ref >60)
GLUCOSE: 110 mg/dL — AB (ref 65–99)
POTASSIUM: 4.7 mmol/L (ref 3.5–5.1)
Sodium: 144 mmol/L (ref 135–145)

## 2015-04-11 LAB — GLUCOSE, CAPILLARY: Glucose-Capillary: 90 mg/dL (ref 65–99)

## 2015-04-11 MED ORDER — AMLODIPINE BESYLATE 2.5 MG PO TABS: 7.5000 mg | ORAL_TABLET | Freq: Every day | ORAL | Status: DC

## 2015-04-11 MED ORDER — HYDRALAZINE HCL 25 MG PO TABS: 25.0000 mg | ORAL_TABLET | Freq: Three times a day (TID) | ORAL | Status: DC

## 2015-04-11 MED ORDER — PREDNISONE 10 MG PO TABS: 10.0000 mg | ORAL_TABLET | Freq: Every day | ORAL | Status: DC

## 2015-04-11 MED ORDER — FUROSEMIDE 40 MG PO TABS: 40.0000 mg | ORAL_TABLET | Freq: Every day | ORAL | Status: AC

## 2015-04-11 NOTE — Progress Notes (Signed)
Pt discharged home today per Dr. Felecia ShellingFanta.  Pt's IV site D/C'd and WDL.  Pt's VSS.  Pt provided with home medication list, discharge instructions and prescriptions.  Verbalized understanding.  Pt awaiting arrival of ride home at this time.  Pt stable for discharge.

## 2015-04-11 NOTE — Discharge Summary (Signed)
Physician Discharge Summary  Patient ID: Gentry FitzRichetta N Ehmke MRN: [id number] DOB/AGE: 1931-08-28 81 y.o. Primary Care Physician:Broly Hatfield, MD Admit date: 04/08/2015 Discharge date: 04/11/2015    Discharge Diagnoses:   Principal Problem:   Acute respiratory failure Active Problems:   Permanent atrial fibrillation   HTN (hypertension)   Dyslipidemia   Acute on chronic diastolic heart failure   Acute on chronic kidney failure   Renal artery stenosis   COPD exacerbation   Acute respiratory failure with hypoxia     Medication List    TAKE these medications        albuterol 108 (90 BASE) MCG/ACT inhaler  Commonly known as:  PROVENTIL HFA;VENTOLIN HFA  Inhale 2 puffs into the lungs every 6 (six) hours as needed for wheezing.     amLODipine 2.5 MG tablet  Commonly known as:  NORVASC  Take 3 tablets (7.5 mg total) by mouth at bedtime.     aspirin EC 81 MG tablet  Take 81 mg by mouth every morning.     BREO ELLIPTA 100-25 MCG/INH Aepb  Generic drug:  Fluticasone Furoate-Vilanterol  Inhale 1 puff into the lungs daily.     ELIQUIS 2.5 MG Tabs tablet  Generic drug:  apixaban  Take 2.5 mg by mouth 2 (two) times daily.     furosemide 40 MG tablet  Commonly known as:  LASIX  Take 1 tablet (40 mg total) by mouth daily.     hydrALAZINE 25 MG tablet  Commonly known as:  APRESOLINE  Take 1 tablet (25 mg total) by mouth every 8 (eight) hours.     labetalol 300 MG tablet  Commonly known as:  NORMODYNE  Take 300 mg by mouth 2 (two) times daily.     lisinopril 40 MG tablet  Commonly known as:  PRINIVIL,ZESTRIL  Take 1 tablet (40 mg total) by mouth daily.     potassium chloride SA 20 MEQ tablet  Commonly known as:  K-DUR,KLOR-CON  Take 2 tablets (40 mEq total) by mouth 2 (two) times daily.     predniSONE 10 MG tablet  Commonly known as:  DELTASONE  Take 1 tablet (10 mg total) by mouth daily with breakfast.     simvastatin 20 MG tablet  Commonly known as:  ZOCOR   Take 20 mg by mouth every evening.     tiotropium 18 MCG inhalation capsule  Commonly known as:  SPIRIVA  Place 18 mcg into inhaler and inhale every morning.     Vitamin D (Cholecalciferol) 1000 UNITS Tabs  Take 1 capsule by mouth daily.        Discharged Condition: improved    Consults: cardiology  Significant Diagnostic Studies: Dg Chest 2 View  04/08/2015   CLINICAL DATA:  Shortness of breath since last night. Bilateral foot swelling for 1 week. Smoker.  EXAM: CHEST  2 VIEW  COMPARISON:  11/05/2013  FINDINGS: There is cardiomegaly. No confluent airspace opacities or effusions. No acute bony abnormality.  IMPRESSION: Cardiomegaly.  No active disease.   Electronically Signed   By: Charlett NoseKevin  Dover M.D.   On: 04/08/2015 08:35    Lab Results: Basic Metabolic Panel:  Recent Labs  40/08/2704/31/16 0801  04/10/15 0623 04/11/15 0621  NA 144  < > 143 144  K 4.3  < > 4.8 4.7  CL 109  < > 104 105  CO2 26  < > 30 30  GLUCOSE 118*  < > 152* 110*  BUN 49*  < > 60* 64*  CREATININE  2.31*  < > 2.28* 2.33*  CALCIUM 8.9  < > 9.0 8.8*  MG 2.1  --   --   --   < > = values in this interval not displayed. Liver Function Tests:  Recent Labs  04/09/15 0626  AST 16  ALT 18  ALKPHOS 86  BILITOT 0.5  PROT 6.7  ALBUMIN 3.3*     CBC:  Recent Labs  04/08/15 0801 04/09/15 0626  WBC 6.7 7.8  NEUTROABS 4.5  --   HGB 11.6* 12.8  HCT 37.7 41.7  MCV 89.3 88.9  PLT 248 257    No results found for this or any previous visit (from the past 240 hour(s)).   Hospital Course:   This is an 81 years old female with history of multiple medical illnesses was admitted due to shortness of breath due to acute on chronic diastolic CHF. Patient was also had COPD exacerbation. She is also a known case of chronic atrial fibrillatio, hypertension and PVD. Patient continued to smoking tobacco. Cardiology consult was made who assisted in the treatment. Over the hospital stay patient improved and she is being  discharged in stable condition  Discharge Exam: Blood pressure 156/80, pulse 76, temperature 98.9 F (37.2 C), temperature source Oral, resp. rate 20, height  (1.702 m), weight 77.338 kg (170 lb 8 oz), SpO2 97 %.   Disposition:  home        Follow-up Information    Follow up with [facility], MD In 2 weeks.   Specialty:  Internal Medicine   Contact information:   [address] [place] [address] [phone]       Signed: Avon Gully   04/11/2015, 7:54 AM

## 2015-04-11 NOTE — Care Management Note (Signed)
Case Management Note  Patient Details  Name: [name] MRN: [id number] Date of Birth: 1930-12-19  Subjective/Objective:                    Action/Plan:   Expected Discharge Date:  04/10/15               Expected Discharge Plan:  Home/Self Care  In-House Referral:  NA  Discharge planning Services  CM Consult  Post Acute Care Choice:  NA Choice offered to:  NA  DME Arranged:    DME Agency:     HH Arranged:    HH Agency:     Status of Service:  Completed, signed off  Medicare Important Message Given:  Yes Date Medicare IM Given:  04/11/15 Medicare IM give by:  Arlyss Queenammy Long Brimage, RN BSN CM Date Additional Medicare IM Given:    Additional Medicare Important Message give by:     If discussed at Long Length of Stay Meetings, dates discussed:    Additional Comments: Pt discharged home today. No CM needs noted. Pt does not qualify for home O2 at this time. Arlyss QueenBlackwell, Ngai Parcell Otwayrowder, RN 04/11/2015, 10:27 AM

## 2015-04-11 NOTE — Progress Notes (Signed)
SUBJECTIVE: Feels well. Going home today. No chest pain/SOB.     Intake/Output Summary (Last 24 hours) at 04/11/15 0951 Last data filed at 04/11/15 0700  Gross per 24 hour  Intake    840 ml  Output   1600 ml  Net   -760 ml    Current Facility-Administered Medications  Medication Dose Route Frequency Provider Last Rate Last Dose  . 0.9 %  sodium chloride infusion  250 mL Intravenous PRN Gwenyth Bender, NP      . acetaminophen (TYLENOL) tablet 650 mg  650 mg Oral Q6H PRN Gwenyth Bender, NP       Or  . acetaminophen (TYLENOL) suppository 650 mg  650 mg Rectal Q6H PRN Gwenyth Bender, NP      . albuterol (PROVENTIL) (2.5 MG/3ML) 0.083% nebulizer solution 2.5 mg  2.5 mg Nebulization Q2H PRN Gwenyth Bender, NP   2.5 mg at 04/08/15 1408  . albuterol (PROVENTIL) (2.5 MG/3ML) 0.083% nebulizer solution 2.5 mg  2.5 mg Nebulization Q6H Luane School, RRT   2.5 mg at 04/11/15 0710  . alum & mag hydroxide-simeth (MAALOX/MYLANTA) 200-200-20 MG/5ML suspension 30 mL  30 mL Oral Q6H PRN Lesle Chris Black, NP      . amLODipine (NORVASC) tablet 7.5 mg  7.5 mg Oral QHS Laqueta Linden, MD   7.5 mg at 04/11/15 0000  . antiseptic oral rinse (CPC / CETYLPYRIDINIUM CHLORIDE 0.05%) solution 7 mL  7 mL Mouth Rinse q12n4p Avon Gully, MD   7 mL at 04/10/15 1600  . apixaban (ELIQUIS) tablet 2.5 mg  2.5 mg Oral BID Gwenyth Bender, NP   2.5 mg at 04/10/15 2359  . aspirin EC tablet 81 mg  81 mg Oral q morning - 10a Gwenyth Bender, NP   81 mg at 04/10/15 1610  . Fluticasone Furoate-Vilanterol 100-25 MCG/INH AEPB 1 puff  1 puff Inhalation Daily Gwenyth Bender, NP   1 puff at 04/10/15 1922  . furosemide (LASIX) tablet 40 mg  40 mg Oral Daily Laqueta Linden, MD      . guaiFENesin-dextromethorphan (ROBITUSSIN DM) 100-10 MG/5ML syrup 5 mL  5 mL Oral Q4H PRN Gwenyth Bender, NP      . hydrALAZINE (APRESOLINE) injection 5 mg  5 mg Intravenous Q4H PRN Elliot Cousin, MD      . hydrALAZINE (APRESOLINE) tablet 25  mg  25 mg Oral 3 times per day Laqueta Linden, MD   25 mg at 04/11/15 9604  . HYDROcodone-acetaminophen (NORCO/VICODIN) 5-325 MG per tablet 1-2 tablet  1-2 tablet Oral Q4H PRN Lesle Chris Black, NP      . insulin aspart (novoLOG) injection 0-5 Units  0-5 Units Subcutaneous QHS Elliot Cousin, MD   0 Units at 04/08/15 2200  . insulin aspart (novoLOG) injection 0-9 Units  0-9 Units Subcutaneous TID WC Elliot Cousin, MD   1 Units at 04/10/15 1746  . labetalol (NORMODYNE) tablet 300 mg  300 mg Oral BID Gwenyth Bender, NP   300 mg at 04/10/15 2358  . lisinopril (PRINIVIL,ZESTRIL) tablet 10 mg  10 mg Oral QHS Elliot Cousin, MD   10 mg at 04/10/15 2359  . morphine 2 MG/ML injection 1 mg  1 mg Intravenous Q4H PRN Gwenyth Bender, NP      . ondansetron [facility]) tablet 4 mg  4 mg Oral Q6H PRN Gwenyth Bender, NP       Or  .  ondansetron (ZOFRAN) injection 4 mg  4 mg Intravenous Q6H PRN Lesle Chris Black, NP      . polyethylene glycol (MIRALAX / GLYCOLAX) packet 17 g  17 g Oral Daily PRN Lesle Chris Black, NP      . potassium chloride SA (K-DUR,KLOR-CON) CR tablet 20 mEq  20 mEq Oral BID Elliot Cousin, MD   20 mEq at 04/10/15 2358  . simvastatin (ZOCOR) tablet 20 mg  20 mg Oral QPM Gwenyth Bender, NP   20 mg at 04/10/15 1746  . sodium chloride 0.9 % injection 3 mL  3 mL Intravenous Q12H Lesle Chris Black, NP   3 mL at 04/08/15 2200  . sodium chloride 0.9 % injection 3 mL  3 mL Intravenous Q12H Lesle Chris Black, NP   3 mL at 04/11/15 0001  . sodium chloride 0.9 % injection 3 mL  3 mL Intravenous PRN Gwenyth Bender, NP      . sorbitol 70 % solution 30 mL  30 mL Oral Daily PRN Gwenyth Bender, NP      . tiotropium [facility]) inhalation capsule 18 mcg  18 mcg Inhalation q morning - 10a Gwenyth Bender, NP   18 mcg at 04/11/15 0710  . traZODone (DESYREL) tablet 25 mg  25 mg Oral QHS PRN Gwenyth Bender, NP        Filed Vitals:   04/10/15 2358 04/11/15 0242 04/11/15 1610 04/11/15 0710  BP: 190/76  156/80   Pulse: 69  76   Temp:   98.9 F  (37.2 C)   TempSrc:   Oral   Resp:   20   Height:      Weight:   170 lb 8 oz (77.338 kg)   SpO2:  100% 100% 97%    PHYSICAL EXAM General: NAD Neck: No JVD, no thyromegaly or thyroid nodule.  Lungs: Faint expiratory wheezes, no rales. CV: Regular rate, irregular rhythm, normal S1/S2, no S3, no murmur.No dorsal pedal nor periankle edema noted.  Abdomen: Soft, nontender, no distention.  Skin: Intact without lesions or rashes.  Neurologic: Alert and oriented x 3.  Psych: Normal affect. Extremities: No clubbing or cyanosis.  HEENT: Normal.   TELEMETRY: Reviewed telemetry pt in atrial fibrillation.  LABS: Basic Metabolic Panel:  Recent Labs  96/04/54 0623 04/11/15 0621  NA 143 144  K 4.8 4.7  CL 104 105  CO2 30 30  GLUCOSE 152* 110*  BUN 60* 64*  CREATININE 2.28* 2.33*  CALCIUM 9.0 8.8*   Liver Function Tests:  Recent Labs  04/09/15 0626  AST 16  ALT 18  ALKPHOS 86  BILITOT 0.5  PROT 6.7  ALBUMIN 3.3*   No results for input(s): LIPASE, AMYLASE in the last 72 hours. CBC:  Recent Labs  04/09/15 0626  WBC 7.8  HGB 12.8  HCT 41.7  MCV 88.9  PLT 257   Cardiac Enzymes:  Recent Labs  04/08/15 1609 04/09/15 0626  TROPONINI <0.03 <0.03   BNP: Invalid input(s): POCBNP D-Dimer: No results for input(s): DDIMER in the last 72 hours. Hemoglobin A1C: No results for input(s): HGBA1C in the last 72 hours. Fasting Lipid Panel: No results for input(s): CHOL, HDL, LDLCALC, TRIG, CHOLHDL, LDLDIRECT in the last 72 hours. Thyroid Function Tests: No results for input(s): TSH, T4TOTAL, T3FREE, THYROIDAB in the last 72 hours.  Invalid input(s): FREET3 Anemia Panel: No results for input(s): VITAMINB12, FOLATE, FERRITIN, TIBC, IRON, RETICCTPCT in the last 72 hours.  RADIOLOGY: Dg Chest 2 View  04/08/2015   CLINICAL DATA:  Shortness of breath since last night. Bilateral foot swelling for 1 week. Smoker.  EXAM: CHEST  2 VIEW  COMPARISON:  11/05/2013   FINDINGS: There is cardiomegaly. No confluent airspace opacities or effusions. No acute bony abnormality.  IMPRESSION: Cardiomegaly.  No active disease.   Electronically Signed   By: Charlett NoseKevin  Dover M.D.   On: 04/08/2015 08:35      ASSESSMENT AND PLAN: 1. Acute hypoxic respiratory failure which is multifactorial in etiology, and secondary to malignant hypertension with consequent acute on chronic diastolic heart failure and a concomitant COPD exacerbation which may actually be the inciting factor:  Now euvolemic. Lasix 40 mg daily started 6/2. Stable for discharge. Wt 170 lbs today. BP more optimally controlled with amlodipine 7.5 mg and addition of hydralazine 25 mg tid.  2. Atrial fibrillation: HR controlled on labetalol. Continue reduced-dose apixaban for anticoagulation.  3. PVD: Stable. Followed by Dr. Allyson SabalBerry.  4. Malignant HTN: Better controlled with amlodipine 7.5 mg and addition of hydralazine 25 mg tid.  5. Tobacco abuse: Cessation counseling provided.  6. CKD: Needs outpatient monitoring.  Dispo: Discharge to home today. I will arrange for outpt f/u in our office.  Prentice DockerSuresh Marillyn Goren, M.D., F.A.C.C.

## 2015-04-17 ENCOUNTER — Encounter: Payer: Self-pay | Admitting: Adult Health

## 2015-04-17 ENCOUNTER — Ambulatory Visit (INDEPENDENT_AMBULATORY_CARE_PROVIDER_SITE_OTHER): Payer: Medicare Other | Admitting: Adult Health

## 2015-04-17 VITALS — BP 118/58 | HR 69 | Ht 67.0 in | Wt 168.0 lb

## 2015-04-17 DIAGNOSIS — I5032 Chronic diastolic (congestive) heart failure: Secondary | ICD-10-CM

## 2015-04-17 DIAGNOSIS — I481 Persistent atrial fibrillation: Secondary | ICD-10-CM

## 2015-04-17 DIAGNOSIS — I10 Essential (primary) hypertension: Secondary | ICD-10-CM | POA: Diagnosis not present

## 2015-04-17 DIAGNOSIS — I4819 Other persistent atrial fibrillation: Secondary | ICD-10-CM

## 2015-04-17 NOTE — Progress Notes (Signed)
Name: [name]    DOB: Sep 23, 1931  Age: 81 y.o.  MR#: [id number]       PCP:  Avon Gully, MD      Insurance: Payor: [employer] MEDICARE / Plan: [facility] MEDICARE / Product Type: *No Product type* /   CC:    Chief Complaint  Patient presents with  . Atrial Fibrillation  . Hypertension  . PAD    VS Filed Vitals:   04/17/15 1329  BP: 118/58  Pulse: 69  Height:  (1.702 m)  Weight: 168 lb (76.204 kg)  SpO2: 96%    Weights Current Weight  04/17/15 168 lb (76.204 kg)  04/11/15 170 lb 8 oz (77.338 kg)  07/19/14 180 lb 6.4 oz (81.829 kg)    Blood Pressure  BP Readings from Last 3 Encounters:  04/17/15 118/58  04/11/15 156/80  07/19/14 100/58     Admit date:  (Not on file) Last encounter with RMR:  Visit date not found   Allergy Review of patient's allergies indicates no known allergies.  Current Outpatient Prescriptions  Medication Sig Dispense Refill  . albuterol (PROVENTIL HFA;VENTOLIN HFA) 108 (90 BASE) MCG/ACT inhaler Inhale 2 puffs into the lungs every 6 (six) hours as needed for wheezing. 1 Inhaler 2  . amLODipine (NORVASC) 2.5 MG tablet Take 3 tablets (7.5 mg total) by mouth at bedtime. 30 tablet 3  . apixaban (ELIQUIS) 2.5 MG TABS tablet Take 2.5 mg by mouth 2 (two) times daily.    Marland Kitchen aspirin EC 81 MG tablet Take 81 mg by mouth every morning.     . Fluticasone Furoate-Vilanterol (BREO ELLIPTA) 100-25 MCG/INH AEPB Inhale 1 puff into the lungs daily.    . furosemide (LASIX) 40 MG tablet Take 1 tablet (40 mg total) by mouth daily. 30 tablet 3  . hydrALAZINE (APRESOLINE) 25 MG tablet Take 1 tablet (25 mg total) by mouth every 8 (eight) hours. 90 tablet 3  . labetalol (NORMODYNE) 300 MG tablet Take 300 mg by mouth 2 (two) times daily.     Marland Kitchen lisinopril (PRINIVIL,ZESTRIL) 40 MG tablet Take 1 tablet (40 mg total) by mouth daily. 30 tablet 3  . potassium chloride SA (K-DUR,KLOR-CON) 20 MEQ tablet Take 2 tablets (40 mEq total) by mouth 2 (two) times daily. 60  tablet 3  . predniSONE (DELTASONE) 10 MG tablet Take 1 tablet (10 mg total) by mouth daily with breakfast. 30 tablet 0  . simvastatin (ZOCOR) 20 MG tablet Take 20 mg by mouth every evening.    . tiotropium (SPIRIVA) 18 MCG inhalation capsule Place 18 mcg into inhaler and inhale every morning.    . Vitamin D, Cholecalciferol, 1000 UNITS TABS Take 1 capsule by mouth daily.     No current facility-administered medications for this visit.    Discontinued Meds:   There are no discontinued medications.  Patient Active Problem List   Diagnosis Date Noted  . Dyspnea 04/08/2015  . Acute on chronic kidney failure 04/08/2015  . COPD exacerbation 04/08/2015  . Tobacco abuse   . Renal artery stenosis   . Acute respiratory failure   . Acute respiratory failure with hypoxia   . Malignant hypertensive heart disease 11/10/2013  . Pericardial effusion 11/07/2013  . Acute on chronic diastolic heart failure 11/05/2013  . Peripheral vascular disease with claudication 04/05/2013  . COPD (chronic obstructive pulmonary disease) 02/27/2013  . Warfarin-induced coagulopathy 02/27/2013  . Epistaxis 02/27/2013  . Long term (current) use of anticoagulants 01/14/2013  . Permanent atrial fibrillation 01/11/2013  .  HTN (hypertension) 01/11/2013  . CVA (cerebral infarction) 01/11/2013  . Dyslipidemia 01/11/2013  . Aortic sclerosis 01/11/2013    LABS    Component Value Date/Time   NA 144 04/11/2015 0621   NA 143 04/10/2015 0623   NA 145 04/09/2015 0626   K 4.7 04/11/2015 0621   K 4.8 04/10/2015 0623   K 4.8 04/09/2015 0626   CL 105 04/11/2015 0621   CL 104 04/10/2015 0623   CL 106 04/09/2015 0626   CO2 30 04/11/2015 0621   CO2 30 04/10/2015 0623   CO2 28 04/09/2015 0626   GLUCOSE 110* 04/11/2015 0621   GLUCOSE 152* 04/10/2015 0623   GLUCOSE 138* 04/09/2015 0626   BUN 64* 04/11/2015 0621   BUN 60* 04/10/2015 0623   BUN 52* 04/09/2015 0626   CREATININE 2.33* 04/11/2015 0621   CREATININE 2.28*  04/10/2015 0623   CREATININE 2.28* 04/09/2015 0626   CALCIUM 8.8* 04/11/2015 0621   CALCIUM 9.0 04/10/2015 0623   CALCIUM 9.3 04/09/2015 0626   GFRNONAA 18* 04/11/2015 0621   GFRNONAA 19* 04/10/2015 0623   GFRNONAA 19* 04/09/2015 0626   GFRAA 21* 04/11/2015 0621   GFRAA 22* 04/10/2015 0623   GFRAA 22* 04/09/2015 0626   CMP     Component Value Date/Time   NA 144 04/11/2015 0621   K 4.7 04/11/2015 0621   CL 105 04/11/2015 0621   CO2 30 04/11/2015 0621   GLUCOSE 110* 04/11/2015 0621   BUN 64* 04/11/2015 0621   CREATININE 2.33* 04/11/2015 0621   CALCIUM 8.8* 04/11/2015 0621   PROT 6.7 04/09/2015 0626   ALBUMIN 3.3* 04/09/2015 0626   AST 16 04/09/2015 0626   ALT 18 04/09/2015 0626   ALKPHOS 86 04/09/2015 0626   BILITOT 0.5 04/09/2015 0626   GFRNONAA 18* 04/11/2015 0621   GFRAA 21* 04/11/2015 0621       Component Value Date/Time   WBC 7.8 04/09/2015 0626   WBC 6.7 04/08/2015 0801   WBC 10.2 11/11/2013 0438   HGB 12.8 04/09/2015 0626   HGB 11.6* 04/08/2015 0801   HGB 12.4 11/11/2013 0438   HCT 41.7 04/09/2015 0626   HCT 37.7 04/08/2015 0801   HCT 38.5 11/11/2013 0438   MCV 88.9 04/09/2015 0626   MCV 89.3 04/08/2015 0801   MCV 88.1 11/11/2013 0438    Lipid Panel     Component Value Date/Time   CHOL  05/25/2008 0731    76        ATP III CLASSIFICATION:  <200     mg/dL   Desirable  242-353  mg/dL   Borderline High  >=614    mg/dL   High   TRIG 54 43/15/4008 0731   HDL 37* 05/25/2008 0731   CHOLHDL 2.1 05/25/2008 0731   VLDL 11 05/25/2008 0731   LDLCALC  05/25/2008 0731    28        Total Cholesterol/HDL:CHD Risk Coronary Heart Disease Risk Table                     Men   Women  1/2 Average Risk   3.4   3.3    ABG    Component Value Date/Time   PHART 7.406 03/12/2013 1300   PCO2ART 42.9 03/12/2013 1300   PO2ART 70.6* 03/12/2013 1300   HCO3 26.4* 03/12/2013 1300   TCO2 24.4 03/12/2013 1300   O2SAT 93.6 03/12/2013 1300      BNP (last 3  results)  Recent Labs  04/08/15 0801  BNP 456.0*    ProBNP (last 3 results) No results for input(s): PROBNP in the last 8760 hours.  Cardiac Panel (last 3 results) No results for input(s): CKTOTAL, CKMB, TROPONINI, RELINDX in the last 72 hours.  Iron/TIBC/Ferritin/ %Sat No results found for: IRON, TIBC, FERRITIN, IRONPCTSAT   EKG Orders placed or performed during the hospital encounter of 04/08/15  . EKG 12-Lead  . EKG 12-Lead  . EKG     Prior Assessment and Plan Problem List as of 04/17/2015      Cardiovascular and Mediastinum   Permanent atrial fibrillation   Last Assessment & Plan 07/19/2014 Office Visit Written 07/19/2014 10:40 AM by Runell Gess, MD    Rate controlled on Eliquis anticoagulation      HTN (hypertension)   Last Assessment & Plan 07/19/2014 Office Visit Written 07/19/2014 10:41 AM by Runell Gess, MD    Controlled on current medications      Aortic sclerosis   Peripheral vascular disease with claudication   Last Assessment & Plan 07/19/2014 Office Visit Written 07/19/2014 10:42 AM by Runell Gess, MD    History of staged bilateral external iliac arteries stenting for chronic total occlusions by myself initially 05/21/08 on the left, 01/07/2009 on the right. She has known occluded SFAs bilaterally. Her most recent lower extremity arterial Doppler studies performed 09/13/12 revealed ABIs in the 0.6 range bilaterally with moderate in-stent restenosis of right greater than left an occluded SFAs. She really denies significant lifestyle limiting claudication. We will continue to follow these by duplex ultrasound.      Acute on chronic diastolic heart failure   Pericardial effusion   Malignant hypertensive heart disease   Renal artery stenosis     Respiratory   COPD (chronic obstructive pulmonary disease)   Last Assessment & Plan 09/19/2013 Office Visit Written 09/19/2013 10:27 AM by Abelino Derrick, PA-C    Followed by Dr Juanetta Gosling      Epistaxis    Acute respiratory failure   COPD exacerbation   Acute respiratory failure with hypoxia     Nervous and Auditory   CVA (cerebral infarction)   Last Assessment & Plan 09/19/2013 Office Visit Written 09/19/2013 10:26 AM by Abelino Derrick, PA-C    Old lacunar infarcts seen on CT March 2014        Genitourinary   Acute on chronic kidney failure     Hematopoietic and Hemostatic   Warfarin-induced coagulopathy     Other   Dyslipidemia   Last Assessment & Plan 07/19/2014 Office Visit Written 07/19/2014 10:40 AM by Runell Gess, MD    On statin therapy, followed by her PCP      Long term (current) use of anticoagulants   Last Assessment & Plan 09/19/2013 Office Visit Written 09/19/2013 10:28 AM by Abelino Derrick, PA-C    On Eliquis      Dyspnea   Tobacco abuse       Imaging: Dg Chest 2 View  04/08/2015   CLINICAL DATA:  Shortness of breath since last night. Bilateral foot swelling for 1 week. Smoker.  EXAM: CHEST  2 VIEW  COMPARISON:  11/05/2013  FINDINGS: There is cardiomegaly. No confluent airspace opacities or effusions. No acute bony abnormality.  IMPRESSION: Cardiomegaly.  No active disease.   Electronically Signed   By: Charlett Nose M.D.   On: 04/08/2015 08:35

## 2015-04-17 NOTE — Progress Notes (Signed)
Cardiology Office Note   Date:  04/17/2015   ID:  ABIGIAL NEWVILLE, DOB [birth year], MRN [id number]  PCP:  Avon Gully, MD  Cardiologist: Coral Spikes, NP   Chief Complaint  Patient presents with  . Atrial Fibrillation  . Hypertension  . PAD      History of Present Illness: [name] is a 81 y.o. female who presents for ongoing assessment and management of of chronic diastolic CHF, malignant hypertension, atrial fib, with hx of CKD, PAD, with RAS of the right kidney, and bilateral iliac stenting for CTO in 2009, and 2010 followed by Dr. Allyson Sabal. She was recently hospitalized for decompensated CHF in May of 2016. Echo completed on 04/08/2015 demonstrated EF of 65%-70% with moderate to severe LVH. There was a small to moderate sized pericardia effusion noted which was stable. She was treated for COPD exacerbation as well. Diuresed and released at a wt of 170 lbs.  She is doing well today without complaints. She is maintaining her wt and watching salty food intake. She is compliant with her medications.    Past Medical History  Diagnosis Date  . Mixed hyperlipidemia   . Coronary artery disease     Followed by Dr. Allyson Sabal with [facility]  . PVD (peripheral vascular disease)     Right ABI of 0.5 and left ABI 0.62, occluded bilateral SFAs  . Essential hypertension, benign   . DVT (deep venous thrombosis) 05/2008  . Bilateral iliac artery occlusion   . Aortic valve sclerosis   . Atrial fibrillation     History of GI bleed on Coumadin  . Stroke 03/2013    Reportedly mild  . Renal artery stenosis     70% right renal artery stenosis during angiography which I performed in 2009.  Marland Kitchen Acute respiratory failure   . COPD (chronic obstructive pulmonary disease)   . Tobacco abuse     Past Surgical History  Procedure Laterality Date  . Iliac artery stent Left 05/21/2008    Dr. Allyson Sabal  . Iliac artery stent Right 01/07/2009    Dr. Allyson Sabal  . Brain tumor excision  1999   Hemangioma [facility])  . Cataract extraction Bilateral   . Lens replacement Left   . Doppler echocardiography  02/12/2010    EF =>55%  . Nm myocar perf wall motion  02/12/2010    PERSANTINE: EF 57%;LV norm  . Lea doppler  09/13/2012    Right EIA stent >60%;rgt SFA occluded at prox. and mid level w/reconstitution at distal SFA/pop.;rgt PTA occlude;lft EIAstent >50%;lft mid SFA occluded;lft ATA & PTA occluded  . Renal duplex doppler  02/25/2012    Right 60-99%;lft renal artery no evidence siginificant reduction;rgt & lft kidneys norm size      Current Outpatient Prescriptions  Medication Sig Dispense Refill  . albuterol (PROVENTIL HFA;VENTOLIN HFA) 108 (90 BASE) MCG/ACT inhaler Inhale 2 puffs into the lungs every 6 (six) hours as needed for wheezing. 1 Inhaler 2  . amLODipine (NORVASC) 2.5 MG tablet Take 3 tablets (7.5 mg total) by mouth at bedtime. 30 tablet 3  . apixaban (ELIQUIS) 2.5 MG TABS tablet Take 2.5 mg by mouth 2 (two) times daily.    Marland Kitchen aspirin EC 81 MG tablet Take 81 mg by mouth every morning.     . Fluticasone Furoate-Vilanterol (BREO ELLIPTA) 100-25 MCG/INH AEPB Inhale 1 puff into the lungs daily.    . furosemide (LASIX) 40 MG tablet Take 1 tablet (40 mg total) by mouth daily. 30 tablet 3  .  hydrALAZINE (APRESOLINE) 25 MG tablet Take 1 tablet (25 mg total) by mouth every 8 (eight) hours. 90 tablet 3  . labetalol (NORMODYNE) 300 MG tablet Take 300 mg by mouth 2 (two) times daily.     Marland Kitchen lisinopril (PRINIVIL,ZESTRIL) 40 MG tablet Take 1 tablet (40 mg total) by mouth daily. 30 tablet 3  . potassium chloride SA (K-DUR,KLOR-CON) 20 MEQ tablet Take 2 tablets (40 mEq total) by mouth 2 (two) times daily. 60 tablet 3  . predniSONE (DELTASONE) 10 MG tablet Take 1 tablet (10 mg total) by mouth daily with breakfast. 30 tablet 0  . simvastatin (ZOCOR) 20 MG tablet Take 20 mg by mouth every evening.    . tiotropium (SPIRIVA) 18 MCG inhalation capsule Place 18 mcg into inhaler and inhale every  morning.    . Vitamin D, Cholecalciferol, 1000 UNITS TABS Take 1 capsule by mouth daily.     No current facility-administered medications for this visit.    Allergies:   Review of patient's allergies indicates no known allergies.    Social History:  The patient  reports that she has been smoking Cigarettes.  She started smoking about 63 years ago. She has a 35 pack-year smoking history. She does not have any smokeless tobacco history on file. She reports that she does not drink alcohol or use illicit drugs.   Family History:  The patient's family history includes Colon cancer in her sister; Heart attack in her brother and father; Heart disease in her sister; Hypertension in her mother.    ROS: .   All other systems are reviewed and negative.Unless otherwise mentioned in H&P above.   PHYSICAL EXAM: VS:  BP 118/58 mmHg  Pulse 69  Ht  (1.702 m)  Wt 168 lb (76.204 kg)  BMI 26.31 kg/m2  SpO2 96% , BMI Body mass index is 26.31 kg/(m^2). GEN: Well nourished, well developed, in no acute distress HEENT: normal Neck: no JVD, carotid bruits, or masses Cardiac: RRR; no murmurs, rubs, or gallops,no edema  Respiratory:  clear to auscultation bilaterally, normal work of breathing GI: soft, nontender, nondistended, + BS MS: no deformity or atrophy Skin: warm and dry, no rash Neuro:  Strength and sensation are intact Psych: euthymic mood, full affect   Recent Labs: 04/08/2015: B Natriuretic Peptide 456.0*; Magnesium 2.1 04/09/2015: ALT 18; Hemoglobin 12.8; Platelets 257 04/11/2015: BUN 64*; Creatinine, Ser 2.33*; Potassium 4.7; Sodium 144    Lipid Panel    Component Value Date/Time   CHOL  05/25/2008 0731    76        ATP III CLASSIFICATION:  <200     mg/dL   Desirable  960-454  mg/dL   Borderline High  >=098    mg/dL   High   TRIG 54 11/91/4782 0731   HDL 37* 05/25/2008 0731   CHOLHDL 2.1 05/25/2008 0731   VLDL 11 05/25/2008 0731   LDLCALC  05/25/2008 0731    28        Total  Cholesterol/HDL:CHD Risk Coronary Heart Disease Risk Table                     Men   Women  1/2 Average Risk   3.4   3.3      Wt Readings from Last 3 Encounters:  04/17/15 168 lb (76.204 kg)  04/11/15 170 lb 8 oz (77.338 kg)  07/19/14 180 lb 6.4 oz (81.829 kg)      Other studies Reviewed: Additional studies/ records  that were reviewed today include: None Review of the above records demonstrates: N/A   ASSESSMENT AND PLAN:  1. Atrial fib:  Heart rate is well controlled. She is tolerating Eliquis without evidence of bleeding.   2. Hypertension: Currently well controlled No changes at this time.   3.Chronic CHF: She appears to be well compensated at this visit. She is to continue lasix and daily wts. Avoid salt. She will see Dr. Purvis Sheffield at her request.   Current medicines are reviewed at length with the patient today.    Labs/ tests ordered today include: None No orders of the defined types were placed in this encounter.     Disposition:   FU with 3 months, with Dr. Kirtland Bouchard  Signed, Joni Reining, NP  04/17/2015 1:44 PM    [facility] [address] [place], [place] [address] Phone: [phone]; Fax: [phone]

## 2015-04-17 NOTE — Patient Instructions (Signed)
Your physician recommends that you schedule a follow-up appointment in: 3 months with Dr. Koneswaran  Your physician recommends that you continue on your current medications as directed. Please refer to the Current Medication list given to you today.  Thank you for choosing [facility]!    

## 2015-04-25 DIAGNOSIS — I5032 Chronic diastolic (congestive) heart failure: Secondary | ICD-10-CM | POA: Diagnosis not present

## 2015-04-25 DIAGNOSIS — I739 Peripheral vascular disease, unspecified: Secondary | ICD-10-CM | POA: Diagnosis not present

## 2015-04-25 DIAGNOSIS — I4891 Unspecified atrial fibrillation: Secondary | ICD-10-CM | POA: Diagnosis not present

## 2015-04-25 DIAGNOSIS — J449 Chronic obstructive pulmonary disease, unspecified: Secondary | ICD-10-CM | POA: Diagnosis not present

## 2015-05-13 DIAGNOSIS — I4891 Unspecified atrial fibrillation: Secondary | ICD-10-CM | POA: Diagnosis not present

## 2015-05-13 DIAGNOSIS — I509 Heart failure, unspecified: Secondary | ICD-10-CM | POA: Diagnosis not present

## 2015-05-13 DIAGNOSIS — I739 Peripheral vascular disease, unspecified: Secondary | ICD-10-CM | POA: Diagnosis not present

## 2015-05-13 DIAGNOSIS — J449 Chronic obstructive pulmonary disease, unspecified: Secondary | ICD-10-CM | POA: Diagnosis not present

## 2015-08-12 DIAGNOSIS — I4891 Unspecified atrial fibrillation: Secondary | ICD-10-CM | POA: Diagnosis not present

## 2015-08-12 DIAGNOSIS — I739 Peripheral vascular disease, unspecified: Secondary | ICD-10-CM | POA: Diagnosis not present

## 2015-08-12 DIAGNOSIS — J449 Chronic obstructive pulmonary disease, unspecified: Secondary | ICD-10-CM | POA: Diagnosis not present

## 2015-08-12 DIAGNOSIS — I10 Essential (primary) hypertension: Secondary | ICD-10-CM | POA: Diagnosis not present

## 2015-10-14 ENCOUNTER — Ambulatory Visit: Payer: Medicare Other | Admitting: Cardiovascular Disease

## 2015-11-04 ENCOUNTER — Encounter (HOSPITAL_COMMUNITY): Payer: Self-pay | Admitting: *Deleted

## 2015-11-04 ENCOUNTER — Ambulatory Visit: Payer: Medicare Other | Admitting: Cardiovascular Disease

## 2015-11-04 ENCOUNTER — Emergency Department (HOSPITAL_COMMUNITY): Payer: Medicare Other

## 2015-11-04 ENCOUNTER — Inpatient Hospital Stay (HOSPITAL_COMMUNITY)
Admission: EM | Admit: 2015-11-04 | Discharge: 2015-11-07 | DRG: 190 | Disposition: A | Discharge status: Home / Self Care | Payer: Medicare Other | Attending: Internal Medicine | Admitting: Internal Medicine

## 2015-11-04 DIAGNOSIS — J9601 Acute respiratory failure with hypoxia: Secondary | ICD-10-CM | POA: Diagnosis present

## 2015-11-04 DIAGNOSIS — N184 Chronic kidney disease, stage 4 (severe): Secondary | ICD-10-CM | POA: Diagnosis not present

## 2015-11-04 DIAGNOSIS — N189 Chronic kidney disease, unspecified: Secondary | ICD-10-CM

## 2015-11-04 DIAGNOSIS — Z7901 Long term (current) use of anticoagulants: Secondary | ICD-10-CM | POA: Diagnosis not present

## 2015-11-04 DIAGNOSIS — Z8249 Family history of ischemic heart disease and other diseases of the circulatory system: Secondary | ICD-10-CM

## 2015-11-04 DIAGNOSIS — N179 Acute kidney failure, unspecified: Secondary | ICD-10-CM | POA: Diagnosis present

## 2015-11-04 DIAGNOSIS — Z8673 Personal history of transient ischemic attack (TIA), and cerebral infarction without residual deficits: Secondary | ICD-10-CM | POA: Diagnosis not present

## 2015-11-04 DIAGNOSIS — I4821 Permanent atrial fibrillation: Secondary | ICD-10-CM | POA: Diagnosis present

## 2015-11-04 DIAGNOSIS — I509 Heart failure, unspecified: Secondary | ICD-10-CM | POA: Diagnosis not present

## 2015-11-04 DIAGNOSIS — F17210 Nicotine dependence, cigarettes, uncomplicated: Secondary | ICD-10-CM | POA: Diagnosis present

## 2015-11-04 DIAGNOSIS — I10 Essential (primary) hypertension: Secondary | ICD-10-CM | POA: Diagnosis not present

## 2015-11-04 DIAGNOSIS — I2510 Atherosclerotic heart disease of native coronary artery without angina pectoris: Secondary | ICD-10-CM | POA: Diagnosis present

## 2015-11-04 DIAGNOSIS — D649 Anemia, unspecified: Secondary | ICD-10-CM | POA: Diagnosis not present

## 2015-11-04 DIAGNOSIS — E782 Mixed hyperlipidemia: Secondary | ICD-10-CM | POA: Diagnosis present

## 2015-11-04 DIAGNOSIS — I110 Hypertensive heart disease with heart failure: Secondary | ICD-10-CM | POA: Diagnosis not present

## 2015-11-04 DIAGNOSIS — N170 Acute kidney failure with tubular necrosis: Secondary | ICD-10-CM

## 2015-11-04 DIAGNOSIS — I482 Chronic atrial fibrillation: Secondary | ICD-10-CM

## 2015-11-04 DIAGNOSIS — I4891 Unspecified atrial fibrillation: Secondary | ICD-10-CM | POA: Diagnosis not present

## 2015-11-04 DIAGNOSIS — I130 Hypertensive heart and chronic kidney disease with heart failure and stage 1 through stage 4 chronic kidney disease, or unspecified chronic kidney disease: Secondary | ICD-10-CM | POA: Diagnosis not present

## 2015-11-04 DIAGNOSIS — I5023 Acute on chronic systolic (congestive) heart failure: Secondary | ICD-10-CM

## 2015-11-04 DIAGNOSIS — I5033 Acute on chronic diastolic (congestive) heart failure: Secondary | ICD-10-CM | POA: Diagnosis present

## 2015-11-04 DIAGNOSIS — R0602 Shortness of breath: Secondary | ICD-10-CM | POA: Diagnosis not present

## 2015-11-04 DIAGNOSIS — I5043 Acute on chronic combined systolic (congestive) and diastolic (congestive) heart failure: Secondary | ICD-10-CM | POA: Diagnosis present

## 2015-11-04 DIAGNOSIS — J441 Chronic obstructive pulmonary disease with (acute) exacerbation: Secondary | ICD-10-CM | POA: Diagnosis not present

## 2015-11-04 DIAGNOSIS — Z800 Family history of malignant neoplasm of digestive organs: Secondary | ICD-10-CM | POA: Diagnosis not present

## 2015-11-04 LAB — CBC WITH DIFFERENTIAL/PLATELET
Basophils Absolute: 0 10*3/uL (ref 0.0–0.1)
Basophils Relative: 0 %
Eosinophils Absolute: 0.1 10*3/uL (ref 0.0–0.7)
Eosinophils Relative: 2 %
HEMATOCRIT: 34.4 % — AB (ref 36.0–46.0)
HEMOGLOBIN: 10.8 g/dL — AB (ref 12.0–15.0)
LYMPHS PCT: 20 %
Lymphs Abs: 1.1 10*3/uL (ref 0.7–4.0)
MCH: 28.3 pg (ref 26.0–34.0)
MCHC: 31.4 g/dL (ref 30.0–36.0)
MCV: 90.1 fL (ref 78.0–100.0)
MONOS PCT: 6 %
Monocytes Absolute: 0.3 10*3/uL (ref 0.1–1.0)
NEUTROS PCT: 72 %
Neutro Abs: 3.8 10*3/uL (ref 1.7–7.7)
Platelets: 241 10*3/uL (ref 150–400)
RBC: 3.82 MIL/uL — AB (ref 3.87–5.11)
RDW: 13.7 % (ref 11.5–15.5)
WBC: 5.4 10*3/uL (ref 4.0–10.5)

## 2015-11-04 LAB — COMPREHENSIVE METABOLIC PANEL
ALK PHOS: 80 U/L (ref 38–126)
ALT: 18 U/L (ref 14–54)
ANION GAP: 7 (ref 5–15)
AST: 17 U/L (ref 15–41)
Albumin: 3.6 g/dL (ref 3.5–5.0)
BILIRUBIN TOTAL: 0.8 mg/dL (ref 0.3–1.2)
BUN: 48 mg/dL — ABNORMAL HIGH (ref 6–20)
CO2: 24 mmol/L (ref 22–32)
CREATININE: 2.6 mg/dL — AB (ref 0.44–1.00)
Calcium: 9.2 mg/dL (ref 8.9–10.3)
Chloride: 112 mmol/L — ABNORMAL HIGH (ref 101–111)
GFR calc Af Amer: 19 mL/min — ABNORMAL LOW (ref >60)
GFR, EST NON AFRICAN AMERICAN: 16 mL/min — AB (ref >60)
Glucose, Bld: 109 mg/dL — ABNORMAL HIGH (ref 65–99)
Potassium: 4.3 mmol/L (ref 3.5–5.1)
Sodium: 143 mmol/L (ref 135–145)
Total Protein: 6.6 g/dL (ref 6.5–8.1)

## 2015-11-04 LAB — BRAIN NATRIURETIC PEPTIDE: B Natriuretic Peptide: 428 pg/mL — ABNORMAL HIGH (ref 0.0–100.0)

## 2015-11-04 LAB — PROTIME-INR
INR: 1.28 (ref 0.00–1.49)
Prothrombin Time: 16.1 seconds — ABNORMAL HIGH (ref 11.6–15.2)

## 2015-11-04 LAB — INFLUENZA PANEL BY PCR (TYPE A & B)
H1N1FLUPCR: NOT DETECTED
INFLBPCR: NEGATIVE
Influenza A By PCR: NEGATIVE

## 2015-11-04 LAB — TSH: TSH: 1.074 u[IU]/mL (ref 0.350–4.500)

## 2015-11-04 LAB — TROPONIN I: Troponin I: <0.03 ng/mL (ref <0.031)

## 2015-11-04 MED ORDER — IPRATROPIUM-ALBUTEROL 0.5-2.5 (3) MG/3ML IN SOLN: 3.0000 mL | Freq: Once | RESPIRATORY_TRACT | Status: DC

## 2015-11-04 MED ORDER — ALBUTEROL SULFATE HFA 108 (90 BASE) MCG/ACT IN AERS: 2.0000 | INHALATION_SPRAY | Freq: Four times a day (QID) | RESPIRATORY_TRACT | Status: DC | PRN

## 2015-11-04 MED ORDER — POTASSIUM CHLORIDE CRYS ER 20 MEQ PO TBCR
20.0000 meq | EXTENDED_RELEASE_TABLET | Freq: Two times a day (BID) | ORAL | Status: DC
  Administered 2015-11-04 – 2015-11-06 (×4): 20 meq via ORAL
  Filled 2015-11-04 (×4): qty 1

## 2015-11-04 MED ORDER — HYDRALAZINE HCL 20 MG/ML IJ SOLN
10.0000 mg | Freq: Four times a day (QID) | INTRAMUSCULAR | Status: DC | PRN
  Administered 2015-11-04 – 2015-11-05 (×3): 10 mg via INTRAVENOUS
  Filled 2015-11-04 (×4): qty 1

## 2015-11-04 MED ORDER — APIXABAN 5 MG PO TABS
2.5000 mg | ORAL_TABLET | Freq: Two times a day (BID) | ORAL | Status: DC
  Administered 2015-11-04 – 2015-11-06 (×5): 2.5 mg via ORAL
  Filled 2015-11-04 (×6): qty 1

## 2015-11-04 MED ORDER — METHYLPREDNISOLONE SODIUM SUCC 125 MG IJ SOLR
80.0000 mg | Freq: Once | INTRAMUSCULAR | Status: AC
  Administered 2015-11-04: 80 mg via INTRAVENOUS
  Filled 2015-11-04: qty 2

## 2015-11-04 MED ORDER — AMLODIPINE BESYLATE 5 MG PO TABS
7.5000 mg | ORAL_TABLET | Freq: Every day | ORAL | Status: DC
  Administered 2015-11-04 – 2015-11-06 (×3): 7.5 mg via ORAL
  Filled 2015-11-04 (×3): qty 2

## 2015-11-04 MED ORDER — FUROSEMIDE 10 MG/ML IJ SOLN
40.0000 mg | Freq: Once | INTRAMUSCULAR | Status: AC
  Administered 2015-11-04: 40 mg via INTRAVENOUS
  Filled 2015-11-04: qty 4

## 2015-11-04 MED ORDER — SIMVASTATIN 20 MG PO TABS
20.0000 mg | ORAL_TABLET | Freq: Every evening | ORAL | Status: DC
  Administered 2015-11-04 – 2015-11-06 (×3): 20 mg via ORAL
  Filled 2015-11-04 (×3): qty 1

## 2015-11-04 MED ORDER — LABETALOL HCL 200 MG PO TABS
300.0000 mg | ORAL_TABLET | Freq: Two times a day (BID) | ORAL | Status: DC
  Administered 2015-11-04 – 2015-11-06 (×5): 300 mg via ORAL
  Filled 2015-11-04 (×6): qty 2

## 2015-11-04 MED ORDER — GUAIFENESIN ER 600 MG PO TB12
1200.0000 mg | ORAL_TABLET | Freq: Two times a day (BID) | ORAL | Status: DC
  Administered 2015-11-04 – 2015-11-06 (×6): 1200 mg via ORAL
  Filled 2015-11-04 (×7): qty 2

## 2015-11-04 MED ORDER — FUROSEMIDE 40 MG PO TABS
40.0000 mg | ORAL_TABLET | Freq: Every day | ORAL | Status: DC
  Administered 2015-11-05 – 2015-11-06 (×2): 40 mg via ORAL
  Filled 2015-11-04 (×3): qty 1

## 2015-11-04 MED ORDER — IPRATROPIUM-ALBUTEROL 0.5-2.5 (3) MG/3ML IN SOLN
3.0000 mL | Freq: Once | RESPIRATORY_TRACT | Status: AC
  Administered 2015-11-04: 3 mL via RESPIRATORY_TRACT
  Filled 2015-11-04: qty 3

## 2015-11-04 MED ORDER — ONDANSETRON HCL 4 MG PO TABS: 4.0000 mg | ORAL_TABLET | Freq: Four times a day (QID) | ORAL | Status: DC | PRN

## 2015-11-04 MED ORDER — CETYLPYRIDINIUM CHLORIDE 0.05 % MT LIQD
7.0000 mL | Freq: Two times a day (BID) | OROMUCOSAL | Status: DC
  Administered 2015-11-04 – 2015-11-06 (×6): 7 mL via OROMUCOSAL

## 2015-11-04 MED ORDER — ASPIRIN EC 81 MG PO TBEC
81.0000 mg | DELAYED_RELEASE_TABLET | Freq: Every morning | ORAL | Status: DC
  Administered 2015-11-05 – 2015-11-06 (×2): 81 mg via ORAL
  Filled 2015-11-04 (×3): qty 1

## 2015-11-04 MED ORDER — ALBUTEROL SULFATE (2.5 MG/3ML) 0.083% IN NEBU
2.5000 mg | INHALATION_SOLUTION | Freq: Once | RESPIRATORY_TRACT | Status: AC
  Administered 2015-11-04: 2.5 mg via RESPIRATORY_TRACT
  Filled 2015-11-04: qty 3

## 2015-11-04 MED ORDER — VITAMIN D 1000 UNITS PO TABS
1000.0000 [IU] | ORAL_TABLET | Freq: Every day | ORAL | Status: DC
  Administered 2015-11-06: 1000 [IU] via ORAL
  Filled 2015-11-04 (×3): qty 1

## 2015-11-04 MED ORDER — ALBUTEROL SULFATE (2.5 MG/3ML) 0.083% IN NEBU: 2.5000 mg | INHALATION_SOLUTION | Freq: Four times a day (QID) | RESPIRATORY_TRACT | Status: DC | PRN

## 2015-11-04 MED ORDER — ACETAMINOPHEN 325 MG PO TABS: 650.0000 mg | ORAL_TABLET | Freq: Four times a day (QID) | ORAL | Status: DC | PRN

## 2015-11-04 MED ORDER — IPRATROPIUM BROMIDE 0.02 % IN SOLN
0.5000 mg | Freq: Four times a day (QID) | RESPIRATORY_TRACT | Status: DC
  Administered 2015-11-04 – 2015-11-07 (×11): 0.5 mg via RESPIRATORY_TRACT
  Filled 2015-11-04 (×11): qty 2.5

## 2015-11-04 MED ORDER — GUAIFENESIN-DM 100-10 MG/5ML PO SYRP: 5.0000 mL | ORAL_SOLUTION | ORAL | Status: DC | PRN

## 2015-11-04 MED ORDER — METHYLPREDNISOLONE SODIUM SUCC 125 MG IJ SOLR
80.0000 mg | Freq: Three times a day (TID) | INTRAMUSCULAR | Status: DC
Start: 2015-11-04 — End: 2015-11-06
  Administered 2015-11-04 – 2015-11-06 (×6): 80 mg via INTRAVENOUS
  Filled 2015-11-04 (×6): qty 2

## 2015-11-04 MED ORDER — LEVALBUTEROL HCL 0.63 MG/3ML IN NEBU
0.6300 mg | INHALATION_SOLUTION | Freq: Four times a day (QID) | RESPIRATORY_TRACT | Status: DC
  Administered 2015-11-04 – 2015-11-05 (×6): 0.63 mg via RESPIRATORY_TRACT
  Filled 2015-11-04 (×6): qty 3

## 2015-11-04 MED ORDER — FLUTICASONE FUROATE-VILANTEROL 100-25 MCG/INH IN AEPB
1.0000 | INHALATION_SPRAY | Freq: Every day | RESPIRATORY_TRACT | Status: DC
  Administered 2015-11-05: 1 via RESPIRATORY_TRACT

## 2015-11-04 MED ORDER — SODIUM CHLORIDE 0.9 % IJ SOLN
3.0000 mL | Freq: Two times a day (BID) | INTRAMUSCULAR | Status: DC
  Administered 2015-11-04 – 2015-11-06 (×6): 3 mL via INTRAVENOUS

## 2015-11-04 MED ORDER — ACETAMINOPHEN 650 MG RE SUPP: 650.0000 mg | Freq: Four times a day (QID) | RECTAL | Status: DC | PRN

## 2015-11-04 MED ORDER — MORPHINE SULFATE (PF) 2 MG/ML IV SOLN: 1.0000 mg | INTRAVENOUS | Status: DC | PRN

## 2015-11-04 MED ORDER — ONDANSETRON HCL 4 MG/2ML IJ SOLN
4.0000 mg | Freq: Four times a day (QID) | INTRAMUSCULAR | Status: DC | PRN
Start: 2015-11-04 — End: 2015-11-07

## 2015-11-04 MED ORDER — HYDROCODONE-ACETAMINOPHEN 5-325 MG PO TABS: 1.0000 | ORAL_TABLET | ORAL | Status: DC | PRN

## 2015-11-04 NOTE — ED Notes (Signed)
Pt up ambulatory to bathroom.  Sob with ambulation.

## 2015-11-04 NOTE — Progress Notes (Signed)
Utilization review completed.  L. J. Daxen Lanum RN, BSN, CM 

## 2015-11-04 NOTE — H&P (Signed)
[facility] History and Physical  [name]:[id number] DOB: [birth year] DOA: 11/04/2015  Referring physician: Dr. Lajean Saver PCP: Avon Gully, MD   Chief Complaint: SOB  HPI: [name] is a 81 y.o. female with past medical history of COPD, tobacco abuse, atrial fibrillation on Eliquis and history of PVD. Patient came into the hospital complaining about shortness of breath. She was in her usual state of health until about 2 days ago when she started to have SOB, wheezing, cough and sputum production so she came in today to the emergency department for further evaluation. Patient smoked in the morning before she came to the hospital. In the ED patient was found to be hypoxic O2 sats 88% on room air, hypertensive with blood pressures 198/86, and creatinine of 2.6. Patient admitted to the hospital for further evaluation.    Review of Systems:  Constitutional: negative for anorexia, fevers and sweats Eyes: negative for irritation, redness and visual disturbance Ears, nose, mouth, throat, and face: negative for earaches, epistaxis, nasal congestion and sore throat Respiratory: Positive for cough, dyspnea on exertion, sputum and wheezing Cardiovascular: negative for chest pain, dyspnea, lower extremity edema, orthopnea, palpitations and syncope Gastrointestinal: negative for abdominal pain, constipation, diarrhea, melena, nausea and vomiting Genitourinary:negative for dysuria, frequency and hematuria Hematologic/lymphatic: negative for bleeding, easy bruising and lymphadenopathy Musculoskeletal:negative for arthralgias, muscle weakness and stiff joints Neurological: negative for coordination problems, gait problems, headaches and weakness Endocrine: negative for diabetic symptoms including polydipsia, polyuria and weight loss Allergic/Immunologic: negative for anaphylaxis, hay fever and urticaria  Past Medical History  Diagnosis Date  . Mixed hyperlipidemia   .  Coronary artery disease     Followed by Dr. Allyson Sabal with [facility]  . PVD (peripheral vascular disease) (HCC)     Right ABI of 0.5 and left ABI 0.62, occluded bilateral SFAs  . Essential hypertension, benign   . DVT (deep venous thrombosis) (HCC) 05/2008  . Bilateral iliac artery occlusion (HCC)   . Aortic valve sclerosis   . Atrial fibrillation (HCC)     History of GI bleed on Coumadin  . Acute respiratory failure (HCC)   . COPD (chronic obstructive pulmonary disease) (HCC)   . Tobacco abuse   . Syncope, near   . Orthostatic hypotension   . CHF (congestive heart failure) (HCC)   . Stroke (HCC) 03/2013    Reportedly mild  . Renal artery stenosis (HCC)     70% right renal artery stenosis during angiography which I performed in 2009.   Past Surgical History  Procedure Laterality Date  . Iliac artery stent Left 05/21/2008    Dr. Allyson Sabal  . Iliac artery stent Right 01/07/2009    Dr. Allyson Sabal  . Brain tumor excision  1999    Hemangioma [facility])  . Cataract extraction Bilateral   . Lens replacement Left   . Doppler echocardiography  02/12/2010    EF =>55%  . Nm myocar perf wall motion  02/12/2010    PERSANTINE: EF 57%;LV norm  . Lea doppler  09/13/2012    Right EIA stent >60%;rgt SFA occluded at prox. and mid level w/reconstitution at distal SFA/pop.;rgt PTA occlude;lft EIAstent >50%;lft mid SFA occluded;lft ATA & PTA occluded  . Renal duplex doppler  02/25/2012    Right 60-99%;lft renal artery no evidence siginificant reduction;rgt & lft kidneys norm size    Social History:   reports that she has been smoking Cigarettes.  She started smoking about 63 years ago. She has a 35 pack-year smoking history. She  does not have any smokeless tobacco history on file. She reports that she does not drink alcohol or use illicit drugs.  No Known Allergies  Family History  Problem Relation Age of Onset  . Hypertension Mother   . Heart attack Father   . Heart disease Sister   . Colon cancer Sister   .  Heart attack Brother     Prior to Admission medications   Medication Sig Start Date End Date Taking? Authorizing Provider  albuterol (PROVENTIL HFA;VENTOLIN HFA) 108 (90 BASE) MCG/ACT inhaler Inhale 2 puffs into the lungs every 6 (six) hours as needed for wheezing. 01/15/13  Yes Avon Gully, MD  amLODipine (NORVASC) 2.5 MG tablet Take 3 tablets (7.5 mg total) by mouth at bedtime. 04/11/15  Yes Avon Gully, MD  apixaban (ELIQUIS) 2.5 MG TABS tablet Take 2.5 mg by mouth 2 (two) times daily.   Yes Historical Provider, MD  aspirin EC 81 MG tablet Take 81 mg by mouth every morning.    Yes Historical Provider, MD  Fluticasone Furoate-Vilanterol (BREO ELLIPTA) 100-25 MCG/INH AEPB Inhale 1 puff into the lungs daily.   Yes Historical Provider, MD  furosemide (LASIX) 40 MG tablet Take 1 tablet (40 mg total) by mouth daily. 04/11/15  Yes Avon Gully, MD  labetalol (NORMODYNE) 300 MG tablet Take 300 mg by mouth 2 (two) times daily.  11/13/13  Yes Avon Gully, MD  lisinopril (PRINIVIL,ZESTRIL) 40 MG tablet Take 1 tablet (40 mg total) by mouth daily. 11/13/13  Yes Avon Gully, MD  potassium chloride SA (K-DUR,KLOR-CON) 20 MEQ tablet Take 2 tablets (40 mEq total) by mouth 2 (two) times daily. 11/13/13  Yes Avon Gully, MD  simvastatin (ZOCOR) 20 MG tablet Take 20 mg by mouth every evening.   Yes Historical Provider, MD  tiotropium (SPIRIVA) 18 MCG inhalation capsule Place 18 mcg into inhaler and inhale every morning. 01/15/13  Yes Avon Gully, MD  Vitamin D, Cholecalciferol, 1000 UNITS TABS Take 1 capsule by mouth daily.   Yes Historical Provider, MD   Physical Exam: Filed Vitals:   11/04/15 1230 11/04/15 1300  BP: 189/101 198/86  Pulse: 60 61  Resp: 18 21   Constitutional: Oriented to person, place, and time. Well-developed and well-nourished. Cooperative.  Head: Normocephalic and atraumatic.  Nose: Nose normal.  Mouth/Throat: Uvula is midline, oropharynx is clear and moist and mucous membranes are  normal.  Eyes: Conjunctivae and EOM are normal. Pupils are equal, round, and reactive to light.  Neck: Trachea normal and normal range of motion. Neck supple.  Cardiovascular: Normal rate, regular rhythm, S1 normal, S2 normal, normal heart sounds and intact distal pulses.   Pulmonary/Chest: Effort normal and breath sounds normal.  Abdominal: Soft. Bowel sounds are normal. There is no hepatosplenomegaly. There is no tenderness.  Musculoskeletal: Normal range of motion.  Neurological: Alert and oriented to person, place, and time. Has normal strength. No cranial nerve deficit or sensory deficit.  Skin: Skin is warm, dry and intact.  Psychiatric: Has a normal mood and affect. Speech is normal and behavior is normal.   Labs on Admission:  Basic Metabolic Panel:  Recent Labs Lab 11/04/15 1049  NA 143  K 4.3  CL 112*  CO2 24  GLUCOSE 109*  BUN 48*  CREATININE 2.60*  CALCIUM 9.2   Liver Function Tests:  Recent Labs Lab 11/04/15 1049  AST 17  ALT 18  ALKPHOS 80  BILITOT 0.8  PROT 6.6  ALBUMIN 3.6   No results for input(s): LIPASE,  AMYLASE in the last 168 hours. No results for input(s): AMMONIA in the last 168 hours. CBC:  Recent Labs Lab 11/04/15 1049  WBC 5.4  NEUTROABS 3.8  HGB 10.8*  HCT 34.4*  MCV 90.1  PLT 241   Cardiac Enzymes:  Recent Labs Lab 11/04/15 1049  TROPONINI <0.03    BNP (last 3 results)  Recent Labs  04/08/15 0801 11/04/15 1049  BNP 456.0* 428.0*    ProBNP (last 3 results) No results for input(s): PROBNP in the last 8760 hours.  CBG: No results for input(s): GLUCAP in the last 168 hours.  Radiological Exams on Admission: Dg Chest Portable 1 View  11/04/2015  CLINICAL DATA:  Shortness of breath for several days.  Wheezing EXAM: PORTABLE CHEST 1 VIEW COMPARISON:  Apr 08, 2015 FINDINGS: There is cardiomegaly with mild pulmonary venous hypertension. There is a small right pleural effusion. There is interstitial prominence which is  primarily in the lower lung regions and stable. There is no airspace consolidation or adenopathy. IMPRESSION: Suspect a degree of chronic congestive heart failure. Essentially no change from prior study. Electronically Signed   By: Bretta BangWilliam  Woodruff III M.D.   On: 11/04/2015 10:28    EKG: Independently reviewed.   Assessment/Plan Principal Problem:   COPD exacerbation (HCC) Active Problems:   Permanent atrial fibrillation (HCC)   Long term (current) use of anticoagulants   Acute on chronic diastolic heart failure (HCC)   Acute on chronic kidney failure (HCC)   Acute respiratory failure with hypoxia (HCC)    Acute COPD exacerbation Patient presented to the hospital with cough, sputum production, wheezing and SOB. CXR did not show pneumonia, chronic-looking congestion consistent with chronic CHF. Patient is started on antibiotics or steroids, will continue. Added mucolytics, antitussives, bronchodilators and oxygen as needed.  Acute respiratory failure with hypoxia Patient oxygen saturation drop to 88 then to 75% on room air. Placed on 2 L of oxygen. This is secondary to acute COPD exacerbation. Probably need O2 saturation walking screen prior to discharge home.  Chronic diastolic CHF History of chronic diastolic CHF, last 2-D echo in 04/08/2015 showed LVEF of 65-70%. She had moderate to severe concentric left ventricular hypertrophy likely consistent with CHF with preserved LVEF. On 40 mg of Lasix daily, continue.   Permanent atrial fibrillation Rate is controlled with beta blockers. Patient is on Eliquis for anticoagulation. CHA2DS2-VASc score is at least 6 for HTN, hypertension and 2 points for each age of 81 and history of stroke.  Acute on chronic renal failure Has chronic kidney disease stage 3-4, baseline creatinine is 2.3, creatinine on admission is 2.6. Chest x-ray showed a slight congestion, reports she's taking her Lasix given one IV dose of Lasix in the ED. Continue  home dose of Lasix, follow BMP and intake/output closely.  Long-term use of anticoagulants Patient is on Eliquis for atrial fibrillation. Continued no evidence of bleeding.   Code Status: Full code Family Communication: I discussed the plan with the patient in the presence of her daughter at bedside. Disposition Plan: Telemetry  Time spent: 70 minutes  Toula Miyasaki A, MD [facility] Pager [phone]

## 2015-11-04 NOTE — ED Notes (Signed)
Pt ambulated around nurses station. Pt states short of breath while walking. O2 dropped to 84% hr 100. Pt placed back on O2

## 2015-11-04 NOTE — ED Provider Notes (Signed)
CSN: [id number]     Arrival date & time 11/04/15  8295 History  By signing my name below, I, [name], attest that this documentation has been prepared under the direction and in the presence of Avon Gully, MD. Electronically Signed: Marica Holden, ED Scribe. 11/04/2015. 11:13 AM.  Chief Complaint  Patient presents with  . Shortness of Breath   The history is provided by the patient. No language interpreter was used.   PCP: Avon Gully, MD HPI Comments: [name] is a 81 y.o. female, with PMHx noted below including COPD (confirms inhaler use at home), CHF (reports compliance with daily  lasix), CAD, malignant HTN, DVT, Afib, stroke, blood clot (currently on eliquis)  and daily tobacco use (1 ppd), who presents to the Emergency Department complaining of worsening SOB onset couple of days ago. Associated Sx include wheezing. Pt reports lying down aggravates her Sx. Pt denies cough, chest pain, abd pain, rhinorrhea, or any cold like Sx. Pt denies home O2 use. Pt also denies Hx of DM. Pt denies getting a flu vaccine this season.   Past Medical History  Diagnosis Date  . Mixed hyperlipidemia   . Coronary artery disease     Followed by Dr. Allyson Sabal with [facility]  . PVD (peripheral vascular disease) (HCC)     Right ABI of 0.5 and left ABI 0.62, occluded bilateral SFAs  . Essential hypertension, benign   . DVT (deep venous thrombosis) (HCC) 05/2008  . Bilateral iliac artery occlusion (HCC)   . Aortic valve sclerosis   . Atrial fibrillation (HCC)     History of GI bleed on Coumadin  . Acute respiratory failure (HCC)   . COPD (chronic obstructive pulmonary disease) (HCC)   . Tobacco abuse   . Syncope, near   . Orthostatic hypotension   . CHF (congestive heart failure) (HCC)   . Stroke (HCC) 03/2013    Reportedly mild  . Renal artery stenosis (HCC)     70% right renal artery stenosis during angiography which I performed in 2009.   Past Surgical History  Procedure Laterality  Date  . Iliac artery stent Left 05/21/2008    Dr. Allyson Sabal  . Iliac artery stent Right 01/07/2009    Dr. Allyson Sabal  . Brain tumor excision  1999    Hemangioma [facility])  . Cataract extraction Bilateral   . Lens replacement Left   . Doppler echocardiography  02/12/2010    EF =>55%  . Nm myocar perf wall motion  02/12/2010    PERSANTINE: EF 57%;LV norm  . Lea doppler  09/13/2012    Right EIA stent >60%;rgt SFA occluded at prox. and mid level w/reconstitution at distal SFA/pop.;rgt PTA occlude;lft EIAstent >50%;lft mid SFA occluded;lft ATA & PTA occluded  . Renal duplex doppler  02/25/2012    Right 60-99%;lft renal artery no evidence siginificant reduction;rgt & lft kidneys norm size    Family History  Problem Relation Age of Onset  . Hypertension Mother   . Heart attack Father   . Heart disease Sister   . Colon cancer Sister   . Heart attack Brother    Social History  Substance Use Topics  . Smoking status: Current Every Day Smoker -- 1.00 packs/day for 35 years    Types: Cigarettes    Start date: 04/16/1952  . Smokeless tobacco: None     Comment: stopped 29 days ago as of 12-04-13  . Alcohol Use: No   OB History    No data available  Review of Systems A complete 10 system review of systems was obtained and all systems are negative except as noted in the HPI and PMH.  Allergies  Review of patient's allergies indicates no known allergies.  Home Medications   Prior to Admission medications   Medication Sig Start Date End Date Taking? Authorizing Provider  albuterol (PROVENTIL HFA;VENTOLIN HFA) 108 (90 BASE) MCG/ACT inhaler Inhale 2 puffs into the lungs every 6 (six) hours as needed for wheezing. 01/15/13  Yes Avon Gullyesfaye Fanta, MD  amLODipine (NORVASC) 2.5 MG tablet Take 3 tablets (7.5 mg total) by mouth at bedtime. 04/11/15  Yes Avon Gullyesfaye Fanta, MD  apixaban (ELIQUIS) 2.5 MG TABS tablet Take 2.5 mg by mouth 2 (two) times daily.   Yes Historical Provider, MD  aspirin EC 81 MG tablet  Take 81 mg by mouth every morning.    Yes Historical Provider, MD  Fluticasone Furoate-Vilanterol (BREO ELLIPTA) 100-25 MCG/INH AEPB Inhale 1 puff into the lungs daily.   Yes Historical Provider, MD  furosemide (LASIX) 40 MG tablet Take 1 tablet (40 mg total) by mouth daily. 04/11/15  Yes Avon Gullyesfaye Fanta, MD  labetalol (NORMODYNE) 300 MG tablet Take 300 mg by mouth 2 (two) times daily.  11/13/13  Yes Avon Gullyesfaye Fanta, MD  lisinopril (PRINIVIL,ZESTRIL) 40 MG tablet Take 1 tablet (40 mg total) by mouth daily. 11/13/13  Yes Avon Gullyesfaye Fanta, MD  potassium chloride SA (K-DUR,KLOR-CON) 20 MEQ tablet Take 2 tablets (40 mEq total) by mouth 2 (two) times daily. 11/13/13  Yes Avon Gullyesfaye Fanta, MD  simvastatin (ZOCOR) 20 MG tablet Take 20 mg by mouth every evening.   Yes Historical Provider, MD  tiotropium (SPIRIVA) 18 MCG inhalation capsule Place 18 mcg into inhaler and inhale every morning. 01/15/13  Yes Avon Gullyesfaye Fanta, MD  Vitamin D, Cholecalciferol, 1000 UNITS TABS Take 1 capsule by mouth daily.   Yes Historical Provider, MD   Triage Vitals: BP 196/95 mmHg  Pulse 60  Resp 24  Ht 5' 7.5" (1.715 m)  Wt 168 lb (76.204 kg)  BMI 25.91 kg/m2  SpO2 96% Physical Exam  Constitutional: She is oriented to person, place, and time. She appears well-developed and well-nourished.  Moderate respiratory distress with audible wheezing.   HENT:  Head: Normocephalic and atraumatic.  Mouth/Throat: Oropharynx is clear and moist. No oropharyngeal exudate.  Eyes: Conjunctivae and EOM are normal. Pupils are equal, round, and reactive to light.  Neck: Normal range of motion. Neck supple.  No meningismus.  Cardiovascular: Normal rate, regular rhythm, normal heart sounds and intact distal pulses.   No murmur heard. Pulmonary/Chest: She is in respiratory distress. She has wheezes.  Decreased air movement with expiratory wheezing. No peripheral edema.   Abdominal: Soft. There is no tenderness. There is no rebound and no guarding.   Musculoskeletal: Normal range of motion. She exhibits no edema or tenderness.  Neurological: She is alert and oriented to person, place, and time. No cranial nerve deficit. She exhibits normal muscle tone. Coordination normal.  No ataxia on finger to nose bilaterally. No pronator drift. 5/5 strength throughout. CN 2-12 intact.Equal grip strength. Sensation intact.   Skin: Skin is warm.  Psychiatric: She has a normal mood and affect. Her behavior is normal.  Nursing note and vitals reviewed.   ED Course  Procedures (including critical care time) DIAGNOSTIC STUDIES: Oxygen Saturation is 96% on ra, nl by my interpretation.    COORDINATION OF CARE: 10:17 AM: Discussed treatment plan which includes EKG, labs, imaging, and meds with pt at bedside;  patient verbalizes understanding and agrees with treatment plan. 11:12 AM: Recheck, discussed imaging results with pt. Also discussed additional treatment with pt.  12:36 PM: Recheck, pt reports she is feeling better. Pt denies any pain. Informed pt of hospital admission due to dropping 02 levels.  Labs Review Labs Reviewed  CBC WITH DIFFERENTIAL/PLATELET - Abnormal; Notable for the following:    RBC 3.82 (*)    Hemoglobin 10.8 (*)    HCT 34.4 (*)    All other components within normal limits  COMPREHENSIVE METABOLIC PANEL - Abnormal; Notable for the following:    Chloride 112 (*)    Glucose, Bld 109 (*)    BUN 48 (*)    Creatinine, Ser 2.60 (*)    GFR calc non Af Amer 16 (*)    GFR calc Af Amer 19 (*)    All other components within normal limits  PROTIME-INR - Abnormal; Notable for the following:    Prothrombin Time 16.1 (*)    All other components within normal limits  BRAIN NATRIURETIC PEPTIDE - Abnormal; Notable for the following:    B Natriuretic Peptide 428.0 (*)    All other components within normal limits  TROPONIN I  TSH  HEMOGLOBIN A1C  INFLUENZA PANEL BY PCR (TYPE A & B, H1N1)    Imaging Review Dg Chest Portable 1  View  11/04/2015  CLINICAL DATA:  Shortness of breath for several days.  Wheezing EXAM: PORTABLE CHEST 1 VIEW COMPARISON:  Apr 08, 2015 FINDINGS: There is cardiomegaly with mild pulmonary venous hypertension. There is a small right pleural effusion. There is interstitial prominence which is primarily in the lower lung regions and stable. There is no airspace consolidation or adenopathy. IMPRESSION: Suspect a degree of chronic congestive heart failure. Essentially no change from prior study. Electronically Signed   By: Bretta Bang III M.D.   On: 11/04/2015 10:28   I have personally reviewed and evaluated these images and lab results as part of my medical decision-making.   EKG Interpretation   Date/Time:  Tuesday November 04 2015 10:27:24 EST Ventricular Rate:  60 PR Interval:    QRS Duration: 109 QT Interval:  423 QTC Calculation: 423 R Axis:   68 Text Interpretation:  Atrial fibrillation Anterior infarct, old Baseline  wander in lead(s) V2 V3 V5 V6 Artifact No significant change was found  Confirmed by Manus Gunning  MD, Takoda Siedlecki [phone]) on 11/04/2015 11:02:01 AM      MDM   Final diagnoses:  COPD exacerbation (HCC)  Acute on chronic systolic congestive heart failure (HCC)   history of COPD, CHF, atrial fibrillation presenting with 2 day history of worsening shortness of breath with coughing and wheezing. Denies chest pain or fever.  Decreased breath sounds and wheezing on exam. She is given nebulizers and steroids.  Chest x-ray shows what appears to be chronic congestion. Patient with increased air movement after nebulizers.  Cr 2.6 which is slightly worse than baseline of 2.3.  Desaturates to 84% with ambulation. New O2 requirement. May have component of CHF on top of COPD. She is given dose of IV lasix as well. Will need admission for ongoing treatment.  Discussed with Dr. Rich Brave.     I personally performed the services described in this documentation, which was scribed  in my presence. The recorded information has been reviewed and is accurate.    Glynn Octave, MD 11/04/15 [phone]

## 2015-11-04 NOTE — Progress Notes (Signed)
Discussed patients elevated BP with Dr. Arthor CaptainElmahi. Will give evening dose of by mouth norvasc and a dose of IV hydralazine at this time.

## 2015-11-04 NOTE — ED Notes (Signed)
Pt states she is breathing better.  No visible distress.

## 2015-11-04 NOTE — ED Notes (Addendum)
Placed on 2 liters [facility]  

## 2015-11-04 NOTE — ED Notes (Signed)
Pt reports hx of COPD and CHF. C/o shortness of breath increasing over past few days. Audible wheezing noted.

## 2015-11-05 LAB — CBC
HCT: 37.4 % (ref 36.0–46.0)
HEMOGLOBIN: 12 g/dL (ref 12.0–15.0)
MCH: 28.7 pg (ref 26.0–34.0)
MCHC: 32.1 g/dL (ref 30.0–36.0)
MCV: 89.5 fL (ref 78.0–100.0)
Platelets: 273 10*3/uL (ref 150–400)
RBC: 4.18 MIL/uL (ref 3.87–5.11)
RDW: 13.7 % (ref 11.5–15.5)
WBC: 6.4 10*3/uL (ref 4.0–10.5)

## 2015-11-05 LAB — BASIC METABOLIC PANEL
ANION GAP: 8 (ref 5–15)
BUN: 52 mg/dL — ABNORMAL HIGH (ref 6–20)
CHLORIDE: 108 mmol/L (ref 101–111)
CO2: 25 mmol/L (ref 22–32)
CREATININE: 2.37 mg/dL — AB (ref 0.44–1.00)
Calcium: 9.3 mg/dL (ref 8.9–10.3)
GFR calc non Af Amer: 18 mL/min — ABNORMAL LOW (ref >60)
GFR, EST AFRICAN AMERICAN: 21 mL/min — AB (ref >60)
Glucose, Bld: 152 mg/dL — ABNORMAL HIGH (ref 65–99)
Potassium: 4.3 mmol/L (ref 3.5–5.1)
Sodium: 141 mmol/L (ref 135–145)

## 2015-11-05 LAB — HEMOGLOBIN A1C
Hgb A1c MFr Bld: 5.8 % — ABNORMAL HIGH (ref 4.8–5.6)
Mean Plasma Glucose: 120 mg/dL

## 2015-11-05 NOTE — Progress Notes (Signed)
Subjective: Patient was admitted due to shortness of breath due to exacerbations of COPD. She was started on IV steroid and nebulizer treatment. Patient feels better today. No fever or chills.  Objective: Vital signs in last 24 hours: Temp:  [97.6 F (36.4 C)-98.1 F (36.7 C)] 98.1 F (36.7 C) (12/28 0514) Pulse Rate:  [60-102] 68 (12/28 0514) Resp:  [18-25] 20 (12/28 0514) BP: (139-198)/(58-101) 150/62 mmHg (12/28 0514) SpO2:  [75 %-100 %] 95 % (12/28 0514) Weight:  [76.204 kg (168 lb)-76.6 kg (168 lb 14 oz)] 76.6 kg (168 lb 14 oz) (12/27 1421) Weight change:  Last BM Date: 11/03/15  Intake/Output from previous day: 12/27 0701 - 12/28 0700 In: 480 [P.O.:480] Out: 750 [Urine:750]  PHYSICAL EXAM General appearance: alert and no distress Resp: diminished breath sounds bilaterally and wheezes bilaterally Cardio: irregularly irregular rhythm GI: soft, non-tender; bowel sounds normal; no masses,  no organomegaly Extremities: extremities normal, atraumatic, no cyanosis or edema  Lab Results:  Results for orders placed or performed during the hospital encounter of 11/04/15 (from the past 48 hour(s))  CBC with Differential/Platelet     Status: Abnormal   Collection Time: 11/04/15 10:49 AM  Result Value Ref Range   WBC 5.4 4.0 - 10.5 K/uL   RBC 3.82 (L) 3.87 - 5.11 MIL/uL   Hemoglobin 10.8 (L) 12.0 - 15.0 g/dL   HCT 34.4 (L) 36.0 - 46.0 %   MCV 90.1 78.0 - 100.0 fL   MCH 28.3 26.0 - 34.0 pg   MCHC 31.4 30.0 - 36.0 g/dL   RDW 13.7 11.5 - 15.5 %   Platelets 241 150 - 400 K/uL   Neutrophils Relative % 72 %   Neutro Abs 3.8 1.7 - 7.7 K/uL   Lymphocytes Relative 20 %   Lymphs Abs 1.1 0.7 - 4.0 K/uL   Monocytes Relative 6 %   Monocytes Absolute 0.3 0.1 - 1.0 K/uL   Eosinophils Relative 2 %   Eosinophils Absolute 0.1 0.0 - 0.7 K/uL   Basophils Relative 0 %   Basophils Absolute 0.0 0.0 - 0.1 K/uL  Comprehensive metabolic panel     Status: Abnormal   Collection Time: 11/04/15  10:49 AM  Result Value Ref Range   Sodium 143 135 - 145 mmol/L   Potassium 4.3 3.5 - 5.1 mmol/L   Chloride 112 (H) 101 - 111 mmol/L   CO2 24 22 - 32 mmol/L   Glucose, Bld 109 (H) 65 - 99 mg/dL   BUN 48 (H) 6 - 20 mg/dL   Creatinine, Ser 2.60 (H) 0.44 - 1.00 mg/dL   Calcium 9.2 8.9 - 10.3 mg/dL   Total Protein 6.6 6.5 - 8.1 g/dL   Albumin 3.6 3.5 - 5.0 g/dL   AST 17 15 - 41 U/L   ALT 18 14 - 54 U/L   Alkaline Phosphatase 80 38 - 126 U/L   Total Bilirubin 0.8 0.3 - 1.2 mg/dL   GFR calc non Af Amer 16 (L) >60 mL/min   GFR calc Af Amer 19 (L) >60 mL/min    Comment: (NOTE) The eGFR has been calculated using the CKD EPI equation. This calculation has not been validated in all clinical situations. eGFR's persistently <60 mL/min signify possible Chronic Kidney Disease.    Anion gap 7 5 - 15  Protime-INR     Status: Abnormal   Collection Time: 11/04/15 10:49 AM  Result Value Ref Range   Prothrombin Time 16.1 (H) 11.6 - 15.2 seconds   INR 1.28  0.00 - 1.49  Troponin I     Status: None   Collection Time: 11/04/15 10:49 AM  Result Value Ref Range   Troponin I <0.03 <0.031 ng/mL    Comment:        NO INDICATION OF MYOCARDIAL INJURY.   Brain natriuretic peptide     Status: Abnormal   Collection Time: 11/04/15 10:49 AM  Result Value Ref Range   B Natriuretic Peptide 428.0 (H) 0.0 - 100.0 pg/mL  TSH     Status: None   Collection Time: 11/04/15 10:49 AM  Result Value Ref Range   TSH 1.074 0.350 - 4.500 uIU/mL  Hemoglobin A1c     Status: Abnormal   Collection Time: 11/04/15 10:49 AM  Result Value Ref Range   Hgb A1c MFr Bld 5.8 (H) 4.8 - 5.6 %    Comment: (NOTE)         Pre-diabetes: 5.7 - 6.4         Diabetes: >6.4         Glycemic control for adults with diabetes: <7.0    Mean Plasma Glucose 120 mg/dL    Comment: (NOTE) Performed At: [facility] [address] [place], [place] [id number] Lindon Romp MD KX:[id number]   Influenza panel by PCR (type A & B, H1N1)      Status: None   Collection Time: 11/04/15  2:30 PM  Result Value Ref Range   Influenza A By PCR NEGATIVE NEGATIVE   Influenza B By PCR NEGATIVE NEGATIVE   H1N1 flu by pcr NOT DETECTED NOT DETECTED    Comment:        The Xpert Flu assay (FDA approved for nasal aspirates or washes and nasopharyngeal swab specimens), is intended as an aid in the diagnosis of influenza and should not be used as a sole basis for treatment.   Basic metabolic panel     Status: Abnormal   Collection Time: 11/05/15  6:21 AM  Result Value Ref Range   Sodium 141 135 - 145 mmol/L   Potassium 4.3 3.5 - 5.1 mmol/L   Chloride 108 101 - 111 mmol/L   CO2 25 22 - 32 mmol/L   Glucose, Bld 152 (H) 65 - 99 mg/dL   BUN 52 (H) 6 - 20 mg/dL   Creatinine, Ser 2.37 (H) 0.44 - 1.00 mg/dL   Calcium 9.3 8.9 - 10.3 mg/dL   GFR calc non Af Amer 18 (L) >60 mL/min   GFR calc Af Amer 21 (L) >60 mL/min    Comment: (NOTE) The eGFR has been calculated using the CKD EPI equation. This calculation has not been validated in all clinical situations. eGFR's persistently <60 mL/min signify possible Chronic Kidney Disease.    Anion gap 8 5 - 15  CBC     Status: None   Collection Time: 11/05/15  6:21 AM  Result Value Ref Range   WBC 6.4 4.0 - 10.5 K/uL   RBC 4.18 3.87 - 5.11 MIL/uL   Hemoglobin 12.0 12.0 - 15.0 g/dL   HCT 37.4 36.0 - 46.0 %   MCV 89.5 78.0 - 100.0 fL   MCH 28.7 26.0 - 34.0 pg   MCHC 32.1 30.0 - 36.0 g/dL   RDW 13.7 11.5 - 15.5 %   Platelets 273 150 - 400 K/uL    ABGS No results for input(s): PHART, PO2ART, TCO2, HCO3 in the last 72 hours.  Invalid input(s): PCO2 CULTURES No results found for this or any previous visit (from the  past 240 hour(s)). Studies/Results: Dg Chest Portable 1 View  11/04/2015  CLINICAL DATA:  Shortness of breath for several days.  Wheezing EXAM: PORTABLE CHEST 1 VIEW COMPARISON:  Apr 08, 2015 FINDINGS: There is cardiomegaly with mild pulmonary venous hypertension. There is a  small right pleural effusion. There is interstitial prominence which is primarily in the lower lung regions and stable. There is no airspace consolidation or adenopathy. IMPRESSION: Suspect a degree of chronic congestive heart failure. Essentially no change from prior study. Electronically Signed   By: Lowella Grip III M.D.   On: 11/04/2015 10:28    Medications: I have reviewed the patient's current medications.  Assesment:   Principal Problem:   COPD exacerbation ([place]) Active Problems:   Permanent atrial fibrillation ([place])   Long term (current) use of anticoagulants   Acute on chronic diastolic heart failure (HCC)   Acute on chronic kidney failure (HCC)   Acute respiratory failure with hypoxia (HCC)    Plan:  Medications reviewed Will continue nebulizer treatment,IV steroids and diuretics Continue regular treatment Continue oxygen therapy.     LOS: 1 day   Ruqayya Ventress 11/05/2015, 7:59 AM

## 2015-11-05 NOTE — Care Management Note (Signed)
Case Management Note  Patient Details  Name: Gentry FitzRichetta N Barnfield MRN: [id number] Date of Birth: 21-Mar-1931  Subjective/Objective:                  Pt admitted from home with COPD exacerbation. Pt lives alone and will return home at discharge. Pt is independent with ADL's. Pt has a neb machine for home use.  Action/Plan: Will need home O2 assessment prior to discharge.  Expected Discharge Date:                  Expected Discharge Plan:  Home/Self Care  In-House Referral:  NA  Discharge planning Services  CM Consult  Post Acute Care Choice:  NA Choice offered to:  NA  DME Arranged:    DME Agency:     HH Arranged:    HH Agency:     Status of Service:  In process, will continue to follow  Medicare Important Message Given:    Date Medicare IM Given:    Medicare IM give by:    Date Additional Medicare IM Given:    Additional Medicare Important Message give by:     If discussed at Long Length of Stay Meetings, dates discussed:    Additional Comments:  Cheryl FlashBlackwell, Taesean Reth Crowder, RN 11/05/2015, 1:26 PM

## 2015-11-06 ENCOUNTER — Inpatient Hospital Stay (HOSPITAL_COMMUNITY): Payer: Medicare Other

## 2015-11-06 LAB — CBC
HEMATOCRIT: 37.1 % (ref 36.0–46.0)
HEMOGLOBIN: 11.6 g/dL — AB (ref 12.0–15.0)
MCH: 27.9 pg (ref 26.0–34.0)
MCHC: 31.3 g/dL (ref 30.0–36.0)
MCV: 89.2 fL (ref 78.0–100.0)
Platelets: 235 10*3/uL (ref 150–400)
RBC: 4.16 MIL/uL (ref 3.87–5.11)
RDW: 13.6 % (ref 11.5–15.5)
WBC: 14.4 10*3/uL — ABNORMAL HIGH (ref 4.0–10.5)

## 2015-11-06 LAB — BASIC METABOLIC PANEL
ANION GAP: 8 (ref 5–15)
BUN: 65 mg/dL — AB (ref 6–20)
CALCIUM: 9.3 mg/dL (ref 8.9–10.3)
CO2: 24 mmol/L (ref 22–32)
Chloride: 109 mmol/L (ref 101–111)
Creatinine, Ser: 2.54 mg/dL — ABNORMAL HIGH (ref 0.44–1.00)
GFR calc Af Amer: 19 mL/min — ABNORMAL LOW (ref >60)
GFR calc non Af Amer: 17 mL/min — ABNORMAL LOW (ref >60)
GLUCOSE: 153 mg/dL — AB (ref 65–99)
POTASSIUM: 5.1 mmol/L (ref 3.5–5.1)
Sodium: 141 mmol/L (ref 135–145)

## 2015-11-06 MED ORDER — LEVALBUTEROL HCL 0.63 MG/3ML IN NEBU
0.6300 mg | INHALATION_SOLUTION | Freq: Four times a day (QID) | RESPIRATORY_TRACT | Status: DC
  Administered 2015-11-06 – 2015-11-07 (×5): 0.63 mg via RESPIRATORY_TRACT
  Filled 2015-11-06 (×5): qty 3

## 2015-11-06 MED ORDER — POTASSIUM CHLORIDE CRYS ER 20 MEQ PO TBCR
20.0000 meq | EXTENDED_RELEASE_TABLET | Freq: Two times a day (BID) | ORAL | Status: DC
  Filled 2015-11-06: qty 1

## 2015-11-06 MED ORDER — METHYLPREDNISOLONE SODIUM SUCC 40 MG IJ SOLR
40.0000 mg | Freq: Two times a day (BID) | INTRAMUSCULAR | Status: DC
  Administered 2015-11-06 – 2015-11-07 (×2): 40 mg via INTRAVENOUS
  Filled 2015-11-06 (×2): qty 1

## 2015-11-06 NOTE — Progress Notes (Signed)
Subjective: Patient is resting. Her breathing is better. Her wheezing and cough is improving.  Objective: Vital signs in last 24 hours: Temp:  [98.1 F (36.7 C)-98.5 F (36.9 C)] 98.1 F (36.7 C) (12/29 0553) Pulse Rate:  [79-110] 79 (12/29 0553) Resp:  [20] 20 (12/29 0553) BP: (152-186)/(58-85) 152/78 mmHg (12/29 0553) SpO2:  [90 %-99 %] 98 % (12/29 0750) Weight change:  Last BM Date: 11/04/15  Intake/Output from previous day: 12/28 0701 - 12/29 0700 In: 726 [P.O.:720; I.V.:6] Out: -   PHYSICAL EXAM General appearance: alert and no distress Resp: diminished breath sounds bilaterally and wheezes bilaterally Cardio: irregularly irregular rhythm GI: soft, non-tender; bowel sounds normal; no masses,  no organomegaly Extremities: extremities normal, atraumatic, no cyanosis or edema  Lab Results:  Results for orders placed or performed during the hospital encounter of 11/04/15 (from the past 48 hour(s))  CBC with Differential/Platelet     Status: Abnormal   Collection Time: 11/04/15 10:49 AM  Result Value Ref Range   WBC 5.4 4.0 - 10.5 K/uL   RBC 3.82 (L) 3.87 - 5.11 MIL/uL   Hemoglobin 10.8 (L) 12.0 - 15.0 g/dL   HCT 34.4 (L) 36.0 - 46.0 %   MCV 90.1 78.0 - 100.0 fL   MCH 28.3 26.0 - 34.0 pg   MCHC 31.4 30.0 - 36.0 g/dL   RDW 13.7 11.5 - 15.5 %   Platelets 241 150 - 400 K/uL   Neutrophils Relative % 72 %   Neutro Abs 3.8 1.7 - 7.7 K/uL   Lymphocytes Relative 20 %   Lymphs Abs 1.1 0.7 - 4.0 K/uL   Monocytes Relative 6 %   Monocytes Absolute 0.3 0.1 - 1.0 K/uL   Eosinophils Relative 2 %   Eosinophils Absolute 0.1 0.0 - 0.7 K/uL   Basophils Relative 0 %   Basophils Absolute 0.0 0.0 - 0.1 K/uL  Comprehensive metabolic panel     Status: Abnormal   Collection Time: 11/04/15 10:49 AM  Result Value Ref Range   Sodium 143 135 - 145 mmol/L   Potassium 4.3 3.5 - 5.1 mmol/L   Chloride 112 (H) 101 - 111 mmol/L   CO2 24 22 - 32 mmol/L   Glucose, Bld 109 (H) 65 - 99 mg/dL   BUN 48 (H) 6 - 20 mg/dL   Creatinine, Ser 2.60 (H) 0.44 - 1.00 mg/dL   Calcium 9.2 8.9 - 10.3 mg/dL   Total Protein 6.6 6.5 - 8.1 g/dL   Albumin 3.6 3.5 - 5.0 g/dL   AST 17 15 - 41 U/L   ALT 18 14 - 54 U/L   Alkaline Phosphatase 80 38 - 126 U/L   Total Bilirubin 0.8 0.3 - 1.2 mg/dL   GFR calc non Af Amer 16 (L) >60 mL/min   GFR calc Af Amer 19 (L) >60 mL/min    Comment: (NOTE) The eGFR has been calculated using the CKD EPI equation. This calculation has not been validated in all clinical situations. eGFR's persistently <60 mL/min signify possible Chronic Kidney Disease.    Anion gap 7 5 - 15  Protime-INR     Status: Abnormal   Collection Time: 11/04/15 10:49 AM  Result Value Ref Range   Prothrombin Time 16.1 (H) 11.6 - 15.2 seconds   INR 1.28 0.00 - 1.49  Troponin I     Status: None   Collection Time: 11/04/15 10:49 AM  Result Value Ref Range   Troponin I <0.03 <0.031 ng/mL    Comment:  NO INDICATION OF MYOCARDIAL INJURY.   Brain natriuretic peptide     Status: Abnormal   Collection Time: 11/04/15 10:49 AM  Result Value Ref Range   B Natriuretic Peptide 428.0 (H) 0.0 - 100.0 pg/mL  TSH     Status: None   Collection Time: 11/04/15 10:49 AM  Result Value Ref Range   TSH 1.074 0.350 - 4.500 uIU/mL  Hemoglobin A1c     Status: Abnormal   Collection Time: 11/04/15 10:49 AM  Result Value Ref Range   Hgb A1c MFr Bld 5.8 (H) 4.8 - 5.6 %    Comment: (NOTE)         Pre-diabetes: 5.7 - 6.4         Diabetes: >6.4         Glycemic control for adults with diabetes: <7.0    Mean Plasma Glucose 120 mg/dL    Comment: (NOTE) Performed At: [facility] [address] [place], [place] [id number] Lindon Romp MD [id number]   Influenza panel by PCR (type A & B, H1N1)     Status: None   Collection Time: 11/04/15  2:30 PM  Result Value Ref Range   Influenza A By PCR NEGATIVE NEGATIVE   Influenza B By PCR NEGATIVE NEGATIVE   H1N1 flu by pcr NOT DETECTED NOT  DETECTED    Comment:        The Xpert Flu assay (FDA approved for nasal aspirates or washes and nasopharyngeal swab specimens), is intended as an aid in the diagnosis of influenza and should not be used as a sole basis for treatment.   Basic metabolic panel     Status: Abnormal   Collection Time: 11/05/15  6:21 AM  Result Value Ref Range   Sodium 141 135 - 145 mmol/L   Potassium 4.3 3.5 - 5.1 mmol/L   Chloride 108 101 - 111 mmol/L   CO2 25 22 - 32 mmol/L   Glucose, Bld 152 (H) 65 - 99 mg/dL   BUN 52 (H) 6 - 20 mg/dL   Creatinine, Ser 2.37 (H) 0.44 - 1.00 mg/dL   Calcium 9.3 8.9 - 10.3 mg/dL   GFR calc non Af Amer 18 (L) >60 mL/min   GFR calc Af Amer 21 (L) >60 mL/min    Comment: (NOTE) The eGFR has been calculated using the CKD EPI equation. This calculation has not been validated in all clinical situations. eGFR's persistently <60 mL/min signify possible Chronic Kidney Disease.    Anion gap 8 5 - 15  CBC     Status: None   Collection Time: 11/05/15  6:21 AM  Result Value Ref Range   WBC 6.4 4.0 - 10.5 K/uL   RBC 4.18 3.87 - 5.11 MIL/uL   Hemoglobin 12.0 12.0 - 15.0 g/dL   HCT 37.4 36.0 - 46.0 %   MCV 89.5 78.0 - 100.0 fL   MCH 28.7 26.0 - 34.0 pg   MCHC 32.1 30.0 - 36.0 g/dL   RDW 13.7 11.5 - 15.5 %   Platelets 273 150 - 400 K/uL  Basic metabolic panel     Status: Abnormal   Collection Time: 11/06/15  6:01 AM  Result Value Ref Range   Sodium 141 135 - 145 mmol/L   Potassium 5.1 3.5 - 5.1 mmol/L   Chloride 109 101 - 111 mmol/L   CO2 24 22 - 32 mmol/L   Glucose, Bld 153 (H) 65 - 99 mg/dL   BUN 65 (H) 6 - 20 mg/dL  Creatinine, Ser 2.54 (H) 0.44 - 1.00 mg/dL   Calcium 9.3 8.9 - 10.3 mg/dL   GFR calc non Af Amer 17 (L) >60 mL/min   GFR calc Af Amer 19 (L) >60 mL/min    Comment: (NOTE) The eGFR has been calculated using the CKD EPI equation. This calculation has not been validated in all clinical situations. eGFR's persistently <60 mL/min signify possible Chronic  Kidney Disease.    Anion gap 8 5 - 15  CBC     Status: Abnormal   Collection Time: 11/06/15  6:01 AM  Result Value Ref Range   WBC 14.4 (H) 4.0 - 10.5 K/uL   RBC 4.16 3.87 - 5.11 MIL/uL   Hemoglobin 11.6 (L) 12.0 - 15.0 g/dL   HCT 37.1 36.0 - 46.0 %   MCV 89.2 78.0 - 100.0 fL   MCH 27.9 26.0 - 34.0 pg   MCHC 31.3 30.0 - 36.0 g/dL   RDW 13.6 11.5 - 15.5 %   Platelets 235 150 - 400 K/uL    ABGS No results for input(s): PHART, PO2ART, TCO2, HCO3 in the last 72 hours.  Invalid input(s): PCO2 CULTURES No results found for this or any previous visit (from the past 240 hour(s)). Studies/Results: Dg Chest Portable 1 View  11/04/2015  CLINICAL DATA:  Shortness of breath for several days.  Wheezing EXAM: PORTABLE CHEST 1 VIEW COMPARISON:  Apr 08, 2015 FINDINGS: There is cardiomegaly with mild pulmonary venous hypertension. There is a small right pleural effusion. There is interstitial prominence which is primarily in the lower lung regions and stable. There is no airspace consolidation or adenopathy. IMPRESSION: Suspect a degree of chronic congestive heart failure. Essentially no change from prior study. Electronically Signed   By: Lowella Grip III M.D.   On: 11/04/2015 10:28    Medications: I have reviewed the patient's current medications.  Assesment:   Principal Problem:   COPD exacerbation ([place]) Active Problems:   Permanent atrial fibrillation ([place])   Long term (current) use of anticoagulants   Acute on chronic diastolic heart failure (HCC)   Acute on chronic kidney failure (HCC)   Acute respiratory failure with hypoxia (HCC)    Plan:  Medications reviewed Will taper IV steroid Nephrology consult pending Continue regular treatment Continue oxygen therapy.     LOS: 2 days   Judge Duque 11/06/2015, 8:10 AM

## 2015-11-06 NOTE — Consult Note (Signed)
[name] MRN: [id number] DOB/AGE: 79-Aug-1932 79 y.o. Primary Care Physician:FANTA,[name], MD Admit date: 11/04/2015 Chief Complaint:  Chief Complaint  Patient presents with  . Shortness of Breath   HPI: Pt is 81 year old [place] female with past medical hx of CHF who came to ER with c/o dyspnea.  HPI dates back to 2-3 days ago when pt started feeling short of breath. Pt later came to ER and was admitted ith possible CHF/COPD exacerbation. Pt seen today on 3rd floor. Pt is accompanied by her daughter and brother. Pt says " I am much better "  Pt main concern is " Will I go home in morning" NO c/o chest pain  NO c/o fever/cough/chills NO c/o syncope NO c/o raynaud phenomenon.  NO c/o Freq/urgency/dysuria     Past Medical History  Diagnosis Date  . Mixed hyperlipidemia   . Coronary artery disease     Followed by Dr. Allyson Sabal with [facility]  . PVD (peripheral vascular disease) (HCC)     Right ABI of 0.5 and left ABI 0.62, occluded bilateral SFAs  . Essential hypertension, benign   . DVT (deep venous thrombosis) (HCC) 05/2008  . Bilateral iliac artery occlusion (HCC)   . Aortic valve sclerosis   . Atrial fibrillation (HCC)     History of GI bleed on Coumadin  . Acute respiratory failure (HCC)   . COPD (chronic obstructive pulmonary disease) (HCC)   . Tobacco abuse   . Syncope, near   . Orthostatic hypotension   . CHF (congestive heart failure) (HCC)   . Stroke (HCC) 03/2013    Reportedly mild  . Renal artery stenosis (HCC)     70% right renal artery stenosis during angiography which I performed in 2009.        Family History  Problem Relation Age of Onset  . Hypertension Mother   . Heart attack Father   . Heart disease Sister   . Colon cancer Sister   . Heart attack Brother     Social History:  reports that she has been smoking Cigarettes.  She started smoking about 63 years ago. She has a 35 pack-year smoking history. She does not have any  smokeless tobacco history on file. She reports that she does not drink alcohol or use illicit drugs.   Allergies: No Known Allergies  Medications Prior to Admission  Medication Sig Dispense Refill  . albuterol (PROVENTIL HFA;VENTOLIN HFA) 108 (90 BASE) MCG/ACT inhaler Inhale 2 puffs into the lungs every 6 (six) hours as needed for wheezing. 1 Inhaler 2  . amLODipine (NORVASC) 2.5 MG tablet Take 3 tablets (7.5 mg total) by mouth at bedtime. 30 tablet 3  . apixaban (ELIQUIS) 2.5 MG TABS tablet Take 2.5 mg by mouth 2 (two) times daily.    Marland Kitchen aspirin EC 81 MG tablet Take 81 mg by mouth every morning.     . Fluticasone Furoate-Vilanterol (BREO ELLIPTA) 100-25 MCG/INH AEPB Inhale 1 puff into the lungs daily.    . furosemide (LASIX) 40 MG tablet Take 1 tablet (40 mg total) by mouth daily. 30 tablet 3  . labetalol (NORMODYNE) 300 MG tablet Take 300 mg by mouth 2 (two) times daily.     Marland Kitchen lisinopril (PRINIVIL,ZESTRIL) 40 MG tablet Take 1 tablet (40 mg total) by mouth daily. 30 tablet 3  . potassium chloride SA (K-DUR,KLOR-CON) 20 MEQ tablet Take 2 tablets (40 mEq total) by mouth 2 (two) times daily. 60 tablet 3  . simvastatin (ZOCOR) 20 MG  tablet Take 20 mg by mouth every evening.    . tiotropium (SPIRIVA) 18 MCG inhalation capsule Place 18 mcg into inhaler and inhale every morning.    . Vitamin D, Cholecalciferol, 1000 UNITS TABS Take 1 capsule by mouth daily.         ZOX:WRUEAROS:apart from the symptoms mentioned above,there are no other symptoms referable to all systems reviewed.  Marland Kitchen. amLODipine  7.5 mg Oral QHS  . antiseptic oral rinse  7 mL Mouth Rinse BID  . apixaban  2.5 mg Oral BID  . aspirin EC  81 mg Oral q morning - 10a  . cholecalciferol  1,000 Units Oral Daily  . furosemide  40 mg Oral Daily  . guaiFENesin  1,200 mg Oral BID  . ipratropium  0.5 mg Nebulization QID  . labetalol  300 mg Oral BID  . levalbuterol  0.63 mg Nebulization QID  . methylPREDNISolone (SOLU-MEDROL) injection  40 mg  Intravenous Q12H  . potassium chloride SA  20 mEq Oral BID  . simvastatin  20 mg Oral QPM  . sodium chloride  3 mL Intravenous Q12H      Physical Exam: Vital signs in last 24 hours: Temp:  [98.1 F (36.7 C)-98.5 F (36.9 C)] 98.1 F (36.7 C) (12/29 0553) Pulse Rate:  [79-110] 79 (12/29 0553) Resp:  [20] 20 (12/29 0553) BP: (152-186)/(58-85) 152/78 mmHg (12/29 0553) SpO2:  [90 %-99 %] 98 % (12/29 0750) Weight change:  Last BM Date: 11/04/15  Intake/Output from previous day: 12/28 0701 - 12/29 0700 In: 726 [P.O.:720; I.V.:6] Out: -  Total I/O In: 240 [P.O.:240] Out: -    Physical Exam: General- pt is awake,alert, oriented to time place and person Resp- No acute REsp distress, CTA B/L NO Rhonchi CVS- S1S2 regular in rate and rhythm GIT- BS+, soft, NT, ND EXT- NO LE Edema, Cyanosis CNS- CN 2-12 grossly intact. Moving all 4 extremities Psych- normal mood and affect    Lab Results: CBC  Recent Labs  11/05/15 0621 11/06/15 0601  WBC 6.4 14.4*  HGB 12.0 11.6*  HCT 37.4 37.1  PLT 273 235    BMET  Recent Labs  11/05/15 0621 11/06/15 0601  NA 141 141  K 4.3 5.1  CL 108 109  CO2 25 24  GLUCOSE 152* 153*  BUN 52* 65*  CREATININE 2.37* 2.54*  CALCIUM 9.3 9.3    Creat trend 2016  2.3--2.6          2.3 ( baseline) 2015 1.4--1.5 2014  1.2--1.6 2009  1.4--1.9  MICRO No results found for this or any previous visit (from the past 240 hour(s)).    Lab Results  Component Value Date   CALCIUM 9.3 11/06/2015      Impression: 1)Renal  AKI secondary to CHF                AKI vs CKD progresion               CKD stage 4 .               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   Plan:  Agree with current tx and plan Agree with current diuresis Will ask for renal U/s Will ask for Fena Will follow Bm,et      BHUTANI,MANPREET S 11/06/2015, 11:23 AM

## 2015-11-07 LAB — BASIC METABOLIC PANEL
Anion gap: 7 (ref 5–15)
BUN: 82 mg/dL — AB (ref 6–20)
CALCIUM: 8.9 mg/dL (ref 8.9–10.3)
CO2: 24 mmol/L (ref 22–32)
CREATININE: 3.03 mg/dL — AB (ref 0.44–1.00)
Chloride: 112 mmol/L — ABNORMAL HIGH (ref 101–111)
GFR calc Af Amer: 15 mL/min — ABNORMAL LOW (ref >60)
GFR, EST NON AFRICAN AMERICAN: 13 mL/min — AB (ref >60)
Glucose, Bld: 134 mg/dL — ABNORMAL HIGH (ref 65–99)
Potassium: 5.3 mmol/L — ABNORMAL HIGH (ref 3.5–5.1)
SODIUM: 143 mmol/L (ref 135–145)

## 2015-11-07 LAB — CBC
HCT: 37 % (ref 36.0–46.0)
Hemoglobin: 11.4 g/dL — ABNORMAL LOW (ref 12.0–15.0)
MCH: 27.8 pg (ref 26.0–34.0)
MCHC: 30.8 g/dL (ref 30.0–36.0)
MCV: 90.2 fL (ref 78.0–100.0)
PLATELETS: 247 10*3/uL (ref 150–400)
RBC: 4.1 MIL/uL (ref 3.87–5.11)
RDW: 14 % (ref 11.5–15.5)
WBC: 12.8 10*3/uL — AB (ref 4.0–10.5)

## 2015-11-07 LAB — PHOSPHORUS: Phosphorus: 4.8 mg/dL — ABNORMAL HIGH (ref 2.5–4.6)

## 2015-11-07 MED ORDER — PREDNISONE 10 MG (21) PO TBPK: 10.0000 mg | ORAL_TABLET | Freq: Every day | ORAL | Status: DC

## 2015-11-07 NOTE — Care Management Note (Signed)
Case Management Note  Patient Details  Name: [name] MRN: [id number] Date of Birth: [birth year]  Subjective/Objective:                    Action/Plan:   Expected Discharge Date:                  Expected Discharge Plan:  Home/Self Care  In-House Referral:  NA  Discharge planning Services  CM Consult  Post Acute Care Choice:  NA Choice offered to:  NA  DME Arranged:    DME Agency:     HH Arranged:    HH Agency:     Status of Service:  Completed, signed off  Medicare Important Message Given:  Yes Date Medicare IM Given:    Medicare IM give by:    Date Additional Medicare IM Given:    Additional Medicare Important Message give by:     If discussed at Long Length of Stay Meetings, dates discussed:    Additional Comments: Pt discharged home today. Pt does not qualify for home O2 at this time. [name], Bethany Sauseda [place], RN 11/07/2015, 10:43 AM

## 2015-11-07 NOTE — Progress Notes (Signed)
SATURATION QUALIFICATIONS: (This note is used to comply with regulatory documentation for home oxygen)  Patient Saturations on Room Air at Rest = 97%  Patient Saturations on Room Air while Ambulating = 92%  Patient Saturations on 2 Liters of oxygen while Ambulating = 98%  Please briefly explain why patient needs home oxygen:  No need for home O2 noted. Pt is wheezing and verified that she uses NEB Tx at home for wheezing.

## 2015-11-07 NOTE — Care Management Important Message (Signed)
Important Message  Patient Details  Name: Gentry FitzRichetta N Hecht MRN: [id number] Date of Birth: Dec 09, 1930   Medicare Important Message Given:  Yes    Cheryl FlashBlackwell, Dameon Soltis Crowder, RN 11/07/2015, 10:42 AM

## 2015-11-07 NOTE — Progress Notes (Signed)
1120 d/c paperwork, instructions and hard Rx given to patient and patient's daughter. IV catheter removed from LEFT wrist region, IV catheter intact, no s/s of infection noted, patient tolerated well w/no c/o pain or discomfort noted. Tele monitor and wiring removed from patient and central tele office notified and made aware. Patient assisted to vehicle via w/c by staff. Daughter to transport patient home.

## 2015-11-08 DIAGNOSIS — J449 Chronic obstructive pulmonary disease, unspecified: Secondary | ICD-10-CM | POA: Diagnosis not present

## 2015-11-08 DIAGNOSIS — I4891 Unspecified atrial fibrillation: Secondary | ICD-10-CM | POA: Diagnosis not present

## 2015-11-08 DIAGNOSIS — I10 Essential (primary) hypertension: Secondary | ICD-10-CM | POA: Diagnosis not present

## 2015-11-11 NOTE — Discharge Summary (Signed)
Physician Discharge Summary  Patient ID: [name] MRN: [id number] DOB/AGE: Jun 12, 1931 82 y.o. Primary Care Physician:Zairah Arista, MD Admit date: 11/04/2015 Discharge date: 11/11/2015    Discharge Diagnoses:   Principal Problem:   COPD exacerbation (HCC) Active Problems:   Permanent atrial fibrillation (HCC)   Long term (current) use of anticoagulants   Acute on chronic diastolic heart failure (HCC)   Acute on chronic kidney failure (HCC)   Acute respiratory failure with hypoxia (HCC)     Medication List    STOP taking these medications        ELIQUIS 2.5 MG Tabs tablet  Generic drug:  apixaban      TAKE these medications        albuterol 108 (90 Base) MCG/ACT inhaler  Commonly known as:  PROVENTIL HFA;VENTOLIN HFA  Inhale 2 puffs into the lungs every 6 (six) hours as needed for wheezing.     amLODipine 2.5 MG tablet  Commonly known as:  NORVASC  Take 3 tablets (7.5 mg total) by mouth at bedtime.     aspirin EC 81 MG tablet  Take 81 mg by mouth every morning.     BREO ELLIPTA 100-25 MCG/INH Aepb  Generic drug:  Fluticasone Furoate-Vilanterol  Inhale 1 puff into the lungs daily.     furosemide 40 MG tablet  Commonly known as:  LASIX  Take 1 tablet (40 mg total) by mouth daily.     labetalol 300 MG tablet  Commonly known as:  NORMODYNE  Take 300 mg by mouth 2 (two) times daily.     lisinopril 40 MG tablet  Commonly known as:  PRINIVIL,ZESTRIL  Take 1 tablet (40 mg total) by mouth daily.     potassium chloride SA 20 MEQ tablet  Commonly known as:  K-DUR,KLOR-CON  Take 2 tablets (40 mEq total) by mouth 2 (two) times daily.     predniSONE 10 MG (21) Tbpk tablet  Commonly known as:  STERAPRED UNI-PAK 21 TAB  Take 1 tablet (10 mg total) by mouth daily. 4 tab po daily for 3 days, then 3 tab po daily for 3 days, then 2 tab po daily for 3 days, then 1 tab po daily for 3 days     simvastatin 20 MG tablet  Commonly known as:  ZOCOR  Take 20 mg by  mouth every evening.     tiotropium 18 MCG inhalation capsule  Commonly known as:  SPIRIVA  Place 18 mcg into inhaler and inhale every morning.     Vitamin D (Cholecalciferol) 1000 units Tabs  Take 1 capsule by mouth daily.        Discharged Condition: improved    Consults: none  Significant Diagnostic Studies: US Renal  11/06/2015  CLINICAL DATA:  Patient with ATN. EXAM: RENAL / URINARY TRACT ULTRASOUND COMPLETE COMPARISON:  Renal ultrasound 07/24/2014. FINDINGS: Right Kidney: Length: 10.8 cm. No hydronephrosis. Normal renal cortical thickness. Diffusely increased renal cortical echogenicity. There is an exophytic 1.3 cm lesion off of the interpolar region of the right kidney. Left Kidney: Length: 8.2 cm. No hydronephrosis. Normal renal cortical thickness. Diffusely increased renal cortical echogenicity. Bladder: Appears normal for degree of bladder distention. IMPRESSION: Diffusely increased renal cortical echogenicity compatible with chronic medical renal disease. No hydronephrosis. Electronically Signed   By: Annia Belt M.D.   On: 11/06/2015 15:26   Dg Chest Portable 1 View  11/04/2015  CLINICAL DATA:  Shortness of breath for several days.  Wheezing EXAM: PORTABLE CHEST 1 VIEW COMPARISON:  Apr 08, 2015 FINDINGS: There is cardiomegaly with mild pulmonary venous hypertension. There is a small right pleural effusion. There is interstitial prominence which is primarily in the lower lung regions and stable. There is no airspace consolidation or adenopathy. IMPRESSION: Suspect a degree of chronic congestive heart failure. Essentially no change from prior study. Electronically Signed   By: Bretta BangWilliam  Woodruff III M.D.   On: 11/04/2015 10:28    Lab Results: Basic Metabolic Panel: No results for input(s): NA, K, CL, CO2, GLUCOSE, BUN, CREATININE, CALCIUM, MG, PHOS in the last 72 hours. Liver Function Tests: No results for input(s): AST, ALT, ALKPHOS, BILITOT, PROT, ALBUMIN in the last 72  hours.   CBC: No results for input(s): WBC, NEUTROABS, HGB, HCT, MCV, PLT in the last 72 hours.  No results found for this or any previous visit (from the past 240 hour(s)).   Hospital Course:   This is an 82 years old female with history of multiple medical illnesses was admitted due to acute exacerbation of COPD. Patient was treated with IV steroid and nebulizer. Patient improved over the hospital stay. She was advised to stop tobacco smoking and discharged home to be followed in out patient.  Discharge Exam: Blood pressure 165/81, pulse 61, temperature 97.7 F (36.5 C), temperature source Oral, resp. rate 20, height 5' 7.5" (1.715 m), weight 76.6 kg (168 lb 14 oz), SpO2 92 %.   Disposition:  home        Follow-up Information    [facility] Galicia, [facility] In 1 week.   Specialty:  Internal Medicine   Contact information:   [address] [place] [id number] [phone]       Signed: [name],Trafton Roker   11/11/2015, 8:11 AM

## 2015-12-26 ENCOUNTER — Ambulatory Visit (INDEPENDENT_AMBULATORY_CARE_PROVIDER_SITE_OTHER): Payer: Medicare Other | Admitting: Physician Assistant

## 2015-12-26 ENCOUNTER — Encounter: Payer: Self-pay | Admitting: Physician Assistant

## 2015-12-26 ENCOUNTER — Other Ambulatory Visit (HOSPITAL_COMMUNITY)
Admission: RE | Admit: 2015-12-26 | Discharge: 2015-12-26 | Disposition: A | Discharge status: Home / Self Care | Payer: Medicare Other | Source: Ambulatory Visit | Attending: Physician Assistant | Admitting: Physician Assistant

## 2015-12-26 VITALS — BP 154/80 | HR 60 | Ht 67.5 in | Wt 159.0 lb

## 2015-12-26 DIAGNOSIS — I739 Peripheral vascular disease, unspecified: Secondary | ICD-10-CM | POA: Diagnosis not present

## 2015-12-26 DIAGNOSIS — N184 Chronic kidney disease, stage 4 (severe): Secondary | ICD-10-CM

## 2015-12-26 DIAGNOSIS — I5033 Acute on chronic diastolic (congestive) heart failure: Secondary | ICD-10-CM | POA: Diagnosis not present

## 2015-12-26 DIAGNOSIS — Z79899 Other long term (current) drug therapy: Secondary | ICD-10-CM | POA: Insufficient documentation

## 2015-12-26 DIAGNOSIS — I10 Essential (primary) hypertension: Secondary | ICD-10-CM | POA: Diagnosis not present

## 2015-12-26 DIAGNOSIS — Z720 Tobacco use: Secondary | ICD-10-CM

## 2015-12-26 LAB — BASIC METABOLIC PANEL
Anion gap: 8 (ref 5–15)
BUN: 31 mg/dL — AB (ref 6–20)
CALCIUM: 9 mg/dL (ref 8.9–10.3)
CO2: 28 mmol/L (ref 22–32)
CREATININE: 2.26 mg/dL — AB (ref 0.44–1.00)
Chloride: 108 mmol/L (ref 101–111)
GFR calc Af Amer: 22 mL/min — ABNORMAL LOW (ref >60)
GFR, EST NON AFRICAN AMERICAN: 19 mL/min — AB (ref >60)
Glucose, Bld: 108 mg/dL — ABNORMAL HIGH (ref 65–99)
POTASSIUM: 3.8 mmol/L (ref 3.5–5.1)
SODIUM: 144 mmol/L (ref 135–145)

## 2015-12-26 LAB — CBC WITH DIFFERENTIAL/PLATELET
BASOS ABS: 0 10*3/uL (ref 0.0–0.1)
BASOS PCT: 0 %
EOS ABS: 0.1 10*3/uL (ref 0.0–0.7)
EOS PCT: 1 %
HCT: 39.3 % (ref 36.0–46.0)
Hemoglobin: 12.2 g/dL (ref 12.0–15.0)
LYMPHS PCT: 21 %
Lymphs Abs: 1.1 10*3/uL (ref 0.7–4.0)
MCH: 27.7 pg (ref 26.0–34.0)
MCHC: 31 g/dL (ref 30.0–36.0)
MCV: 89.3 fL (ref 78.0–100.0)
Monocytes Absolute: 0.5 10*3/uL (ref 0.1–1.0)
Monocytes Relative: 9 %
Neutro Abs: 3.7 10*3/uL (ref 1.7–7.7)
Neutrophils Relative %: 69 %
PLATELETS: 274 10*3/uL (ref 150–400)
RBC: 4.4 MIL/uL (ref 3.87–5.11)
RDW: 14.2 % (ref 11.5–15.5)
WBC: 5.4 10*3/uL (ref 4.0–10.5)

## 2015-12-26 NOTE — Progress Notes (Addendum)
Cardiology Office Note Date:  12/26/2015  Patient ID:  [name], [name] Jan 17, 1931, MRN [id number] PCP:  Avon Gully, MD  Cardiologist:  Haze RushingAllyson Sabal   Chief Complaint: f/u CHF  History of Present Illness: SHEINA MCLEISH is a 82 y.o. female with history of chronic diastolic CHF with hypertensive heart disease, persistent atrial fib, HTN, CKD stage IV, PAD s/p bilateral iliac stenting (followed by Dr. Allyson Sabal), renal artery stenosis, COPD, tobacco abuse, stroke, dyslipidemia, small-mod pericardial effusion (stable by echo 03/2015) who presents for post-hospital follow-up.   Per review of chart, last renal duplex 07/2014: 1-59% reduction in R renal artery, >60% diameter reduction in L renal artery, repeat recommended 6 months. 2D Echo 03/2015: EF 65-70%, mod-severe concentric LVH, mild-mod MR, severely dilated LA, mildly reduced RV systolic function, mod dilated RA, small-moderate pericardial effusion (stable).   She was recently admitted to the hospital 12/27-11/11/15 with COPD exacerbation. The discharge summary is brief and indicates the patient was treated with IV steroids and nebulizer. From the daily progress notes it appears she may have also been diuresed with renal. D/c BUN/Cr 82/3.03 and K 5.3 (most recent baseline Cr 2.2-2.6 range). Apixaban was discontinued at discharge but there is further mention of this in notes.  The patient comes in today for f/u accompanied by her two daughters. All 3 were surprised to hear she has chronic kidney insufficiency. She does not follow with a nephrologist. She reports she did follow up with her PCP after discharge. She does not believe she's had any repeat bloodwork. She is still on Eliquis because she did not notice it had fallen off her med list. She denies any bleeding. She has not had any chest pain and says she's actually feeling quite well. She made this f/u appointment due to mild increase in BLE edema starting 5 days ago. No  change in UOP. She eats a lot of processed foods including cold cuts. She has cut back her smoking to 1/2 ppd but continues to struggle with fully quitting. She has chronic unchanged DOE.   Past Medical History  Diagnosis Date  . Mixed hyperlipidemia   . Normal cardiac stress test 2011  . PVD (peripheral vascular disease) (HCC)     a. s/p bilateral iliac stenting (followed by Dr. Allyson Sabal).  . Essential hypertension, benign   . DVT (deep venous thrombosis) (HCC) 05/2008  . Bilateral iliac artery occlusion (HCC)   . Aortic valve sclerosis   . Persistent atrial fibrillation (HCC)     History of GI bleed on Coumadin  . Acute respiratory failure (HCC)   . COPD (chronic obstructive pulmonary disease) (HCC)   . Tobacco abuse   . Syncope, near   . Orthostatic hypotension   . Chronic diastolic CHF (congestive heart failure) (HCC)   . Stroke (HCC) 03/2013    Reportedly mild  . Renal artery stenosis (HCC)     a. 70% right renal artery stenosis during angiography performed in 2009. b. Last renal duplex 07/2014: 1-59% reduction in R renal artery, >60% diameter reduction in L renal artery, repeat recommended 6 months.  . Hypertensive heart disease   . CKD (chronic kidney disease), stage IV (HCC)   . Pericardial effusion     a. 2D Echo 03/2015: EF 65-70%, mod-severe concentric LVH, mild-mod MR, severely dilated LA, mildly reduced RV systolic function, mod dilated RA, small-moderate pericardial effusion (stable).    Past Surgical History  Procedure Laterality Date  . Iliac artery stent Left 05/21/2008  Dr. Allyson Sabal  . Iliac artery stent Right 01/07/2009    Dr. Allyson Sabal  . Brain tumor excision  1999    Hemangioma [facility])  . Cataract extraction Bilateral   . Lens replacement Left   . Doppler echocardiography  02/12/2010    EF =>55%  . Nm myocar perf wall motion  02/12/2010    PERSANTINE: EF 57%;LV norm  . Lea doppler  09/13/2012    Right EIA stent >60%;rgt SFA occluded at prox. and mid level  w/reconstitution at distal SFA/pop.;rgt PTA occlude;lft EIAstent >50%;lft mid SFA occluded;lft ATA & PTA occluded  . Renal duplex doppler  02/25/2012    Right 60-99%;lft renal artery no evidence siginificant reduction;rgt & lft kidneys norm size     Current Outpatient Prescriptions  Medication Sig Dispense Refill  . albuterol (PROVENTIL HFA;VENTOLIN HFA) 108 (90 BASE) MCG/ACT inhaler Inhale 2 puffs into the lungs every 6 (six) hours as needed for wheezing. 1 Inhaler 2  . amLODipine (NORVASC) 2.5 MG tablet Take 3 tablets (7.5 mg total) by mouth at bedtime. 30 tablet 3  . apixaban (ELIQUIS) 2.5 MG TABS tablet Take 2.5 mg by mouth 2 (two) times daily.    Marland Kitchen aspirin EC 81 MG tablet Take 81 mg by mouth every morning.     . Fluticasone Furoate-Vilanterol (BREO ELLIPTA) 100-25 MCG/INH AEPB Inhale 1 puff into the lungs daily.    . furosemide (LASIX) 40 MG tablet Take 1 tablet (40 mg total) by mouth daily. 30 tablet 3  . labetalol (NORMODYNE) 300 MG tablet Take 300 mg by mouth 2 (two) times daily.     Marland Kitchen lisinopril (PRINIVIL,ZESTRIL) 40 MG tablet Take 1 tablet (40 mg total) by mouth daily. 30 tablet 3  . potassium chloride SA (K-DUR,KLOR-CON) 20 MEQ tablet Take 2 tablets (40 mEq total) by mouth 2 (two) times daily. 60 tablet 3  . simvastatin (ZOCOR) 20 MG tablet Take 20 mg by mouth every evening.    . tiotropium (SPIRIVA) 18 MCG inhalation capsule Place 18 mcg into inhaler and inhale every morning.    . Vitamin D, Cholecalciferol, 1000 UNITS TABS Take 1 capsule by mouth daily.     No current facility-administered medications for this visit.    Allergies:   Review of patient's allergies indicates no known allergies.   Social History:  The patient  reports that she has been smoking Cigarettes.  She started smoking about 63 years ago. She has a 17.5 pack-year smoking history. She does not have any smokeless tobacco history on file. She reports that she does not drink alcohol or use illicit drugs.    Family History:  The patient's family history includes Colon cancer in her sister; Heart attack in her brother and father; Heart disease in her sister; Hypertension in her mother.  ROS:  Please see the history of present illness.  All other systems are reviewed and otherwise negative.   PHYSICAL EXAM: VS:  BP 154/80 mmHg  Pulse 60  Ht 5' 7.5" (1.715 m)  Wt 159 lb (72.122 kg)  BMI 24.52 kg/m2  SpO2 91% BMI: Body mass index is 24.52 kg/(m^2). Well nourished, well developed AAF in no acute distress HEENT: normocephalic, atraumatic Neck: no JVD, carotid bruits or masses Cardiac:  normal S1, S2; RRR; no murmurs, rubs, or gallops Lungs:  clear to auscultation bilaterally, no wheezing, rhonchi or rales Abd: soft, nontender, no hepatomegaly, + BS MS: no deformity or atrophy Ext: trace-borderline 1+ BLE pedal/ankle edema; no erythema. Striations present indicative of prior  fuller volume status that has improved Skin: warm and dry, no rash Neuro:  moves all extremities spontaneously, no focal abnormalities noted, follows commands Psych: euthymic mood, full affect  EKG:  Done today shows atrial fib 61bpm, nonspecific ST-T changes  Recent Labs: 04/08/2015: Magnesium 2.1 11/04/2015: ALT 18; B Natriuretic Peptide 428.0*; TSH 1.074 11/07/2015: BUN 82*; Creatinine, Ser 3.03*; Hemoglobin 11.4*; Platelets 247; Potassium 5.3*; Sodium 143  No results found for requested labs within last 365 days.   CrCl cannot be calculated (Patient has no serum creatinine result on file.).   Wt Readings from Last 3 Encounters:  12/26/15 159 lb (72.122 kg)  11/04/15 168 lb 14 oz (76.6 kg)  04/17/15 168 lb (76.204 kg)     Other studies reviewed: Additional studies/records reviewed today include: summarized above  ASSESSMENT AND PLAN:  1. Acute on chronic diastolic CHF - suspect exacerbated by high sodium intake. Her weight is actually down a few pounds. Breathing is stable. We discussed importance of low  sodium diet and restriction of fluid to <64 oz per day. I do not feel comfortable empirically adjusting her diuretics until we see what her kidney function looks like.  2. CKD stage IV-V - check BMET today. Note she recently had hyperkalemia during recent admission as well. I feel she would probably benefit from referral to nephrology depending on this information. 3. Persistent atrial fib - likely permanent at this point. She remains on Eliquis despite recent discontinuation off med list during recent admission for unclear reason. Recheck BMET, CBC. I plan to review the strategy for anticoagulation with her primary cardiologist once labs are back (as some of our cardiologists are not comfortable using NOACs in advanced renal disease). I also plan to review whether she should be continued on aspirin along with Eliquis. 4. Essential HTN - BP mildly elevated in clinic. Will f/u renal function before making further adjustments. 5. PVD - will refer back to Dr. Allyson Sabal. She was due to f/u with him in December but missed this due to hospitalization. 6. Tobacco abuse - we reviewed the importance of complete cessation.  Disposition: F/u with Dr. Allyson Sabal in 6 weeks. F/u with Dr. Purvis Sheffield will be TBD based on outcome of above.  Current medicines are reviewed at length with the patient today.  The patient did not have any concerns regarding medicines.   Addendum: Kidney function appears to have come back to baseline of around ~2.2-2.3 (which still represents significant chronic kidney disease stage IV). Given how minimal her edema was on exam, I would prefer to try conservative measures first to improve her swelling. I asked the nurse to have her limit sodium to 2000mg  per day (she reported eating a LOT of processed foods including cold cuts), limiting fluid to less than 64 oz per day, keep legs elevated and use compression hose. If swelling is no better within 1 week she can call back to discuss with Dr.  Purvis Sheffield. Will also refer to nephrology to establish care.   I discussed the patient's case further with Dr. Purvis Sheffield and Dr. Allyson Sabal as I told the patient/family I would. Per our discussion, 1) Dr. Purvis Sheffield recommends to continue the Eliquis for now (2.5mg  twice a day). Since she is on Eliquis, both doctors are OK with her stopping baby aspirin to reduce her bleeding risk. 2) She has f/u with Dr. Allyson Sabal scheduled in April - he thinks it would be helpful to get the studies she is overdue for prior to her appointments. She needs  bilateral renal artery duplex and LE arterial dopplers prior to that appointment. Nurse to arrange. 3) The patient's blood pressure was somewhat elevated at recent office visit. Per review of her hospital stay it was also up at that time. Now that I know what her labs look like, will increase amlodipine to 10mg  daily. The nurse will offer her the option to either return for nurse BP check at the end of this week or follow BP daily at home (mid-day, several hours after medications) and call in with her list of readings at the end of this week for [place] to review and adjust meds as needed. BP may improve with lower sodium and fluid intake. But if it remains up, we may need to add hydralazine. 4) As far as follow-up goes with Dr. Purvis Sheffield, she would benefit from seeing him in f/u in 1 month.  These recommendations were conveyed in result note to nurse at [facility].     Signed, Ronie Spies PA-C 12/26/2015 3:57 PM     [facility] [place] Location [address] [place], [place] [address] [phone]

## 2015-12-26 NOTE — Patient Instructions (Signed)
Medication Instructions:  Your physician recommends that you continue on your current medications as directed. Please refer to the Current Medication list given to you today.   Labwork: Your physician recommends that you return for lab work in: today Cbc bmet   Testing/Procedures: none  Follow-Up: Your physician recommends that you schedule a follow-up appointment in: 6 weeks with Dr.Berry    Any Other Special Instructions Will Be Listed Below (If Applicable).     If you need a refill on your cardiac medications before your next appointment, please call your pharmacy.

## 2015-12-29 ENCOUNTER — Other Ambulatory Visit: Payer: Self-pay

## 2015-12-29 ENCOUNTER — Telehealth: Payer: Self-pay

## 2015-12-29 DIAGNOSIS — I10 Essential (primary) hypertension: Secondary | ICD-10-CM

## 2015-12-29 DIAGNOSIS — I739 Peripheral vascular disease, unspecified: Secondary | ICD-10-CM

## 2015-12-29 DIAGNOSIS — N184 Chronic kidney disease, stage 4 (severe): Secondary | ICD-10-CM

## 2015-12-29 MED ORDER — AMLODIPINE BESYLATE 10 MG PO TABS: 10.0000 mg | ORAL_TABLET | Freq: Every day | ORAL | Status: DC

## 2015-12-29 NOTE — Telephone Encounter (Signed)
Will forward the phone note to Raechel Ache to schedule patient tests.

## 2015-12-29 NOTE — Progress Notes (Signed)
Quick Note:  I discussed the patient's case further with Dr. Purvis Sheffield and Dr. Allyson Sabal as I told the patient/family I would.  Per our discussion, 1) Dr. Purvis Sheffield recommends to continue the Eliquis for now (2.5mg  twice a day). Since she is on Eliquis, both doctors are OK with her stopping baby aspirin to reduce her bleeding risk. 2) She has f/u with Dr. Allyson Sabal scheduled in April - he thinks it would be helpful to get the studies she is overdue for prior to her appointments. She needs bilateral renal artery duplex and LE arterial dopplers prior to that appointment (see Procedures for orders). 3) The patient's blood pressure was somewhat elevated at recent office visit. Per review of her hospital stay it was also up at that time. Now that I know what her labs look like, please increase amlodipine to  daily. She can either return for nurse BP check at the end of this week or follow BP daily at home (mid-day, several hours after medications) and call in with her list of readings at the end of this week for [place] to review and adjust meds as needed. BP may improve with lower sodium and fluid intake. But if it remains up, we may need to add hydralazine. 4) As far as follow-up goes with Dr. Purvis Sheffield, she would benefit from seeing him in f/u in 1 month.  Dayna Dunn PA-C      ______

## 2015-12-29 NOTE — Telephone Encounter (Signed)
-----   Message from Nori Riis, RN sent at 12/29/2015  8:28 AM EST -----   ----- Message -----    From: Laurann Montana, PA-C    Sent: 12/29/2015   7:59 AM      To: Nori Riis, RN  Please call patient. Kidney function appears to have come back to baseline of around ~2.2-2.3 (which still represents significant chronic kidney disease stage IV). Given how minimal her edema was on exam, I would prefer to try conservative measures first to improve her swelling. As we discussed in clinic, need to limit sodium to 2000mg  per day. (She is eating a LOT of processed foods including cold cuts.) and limit fluid to less than 64 oz per day. Please keep legs elevated and use compression hose. If swelling is no better within 1 week she can call back to discuss with Dr. Purvis Sheffield. She would also benefit from a referral to nephrology for her kidney disease - please arrange.  Dayna Dunn PA-C

## 2015-12-29 NOTE — Telephone Encounter (Signed)
-----   Message from Catherine A Carlton, RN sent at 12/29/2015  8:28 AM EST -----   ----- Message -----    From: Dayna N Dunn, PA-C    Sent: 12/29/2015   7:59 AM      To: Catherine A Carlton, RN  Please call patient. Kidney function appears to have come back to baseline of around ~2.2-2.3 (which still represents significant chronic kidney disease stage IV). Given how minimal her edema was on exam, I would prefer to try conservative measures first to improve her swelling. As we discussed in clinic, need to limit sodium to <2000mg per day. (She is eating a LOT of processed foods including cold cuts.) and limit fluid to less than 64 oz per day. Please keep legs elevated and use compression hose. If swelling is no better within 1 week she can call back to discuss with Dr. Koneswaran. She would also benefit from a referral to nephrology for her kidney disease - please arrange.  Dayna Dunn PA-C  

## 2015-12-29 NOTE — Telephone Encounter (Signed)
-----   Message from Laurann Montana, [place] sent at 12/29/2015 12:10 PM EST ----- I discussed the patient's case further with Dr. Purvis Sheffield and Dr. Allyson Sabal as I told the patient/family I would.  Per our discussion, 1) Dr. Purvis Sheffield recommends to continue the Eliquis for now (2.5mg  twice a day). Since she is on Eliquis, both doctors are OK with her stopping baby aspirin to reduce her bleeding risk. 2) She has f/u with Dr. Allyson Sabal scheduled in April - he thinks it would be helpful to get the studies she is overdue for prior to her appointments. She needs bilateral renal artery duplex and LE arterial dopplers prior to that appointment (see Procedures for orders). 3) The patient's blood pressure was somewhat elevated at recent office visit. Per review of her hospital stay it was also up at that time. Now that I know what her labs look like, please increase amlodipine to  daily. She can either return for nurse BP check at the end of this week or follow BP daily at home (mid-day, several hours after medications) and call in with her list of readings at the end of this week for [place] to review and adjust meds as needed. BP may improve with lower sodium and fluid intake. But if it remains up, we may need to add hydralazine. 4) As far as follow-up goes with Dr. Purvis Sheffield, she would benefit from seeing him in f/u in 1 month.  Dayna Dunn PA-C

## 2015-12-29 NOTE — Telephone Encounter (Signed)
Pt made aware. Sent in referral to nephrology. Discussed cutting back on sodium and using compression hose and elevating legs for swelling. Pt voiced understanding.

## 2015-12-29 NOTE — Telephone Encounter (Signed)
Sent to TG TO SCHEDULE TESTS.

## 2015-12-29 NOTE — Progress Notes (Signed)
Quick Note:  Please call patient. Kidney function appears to have come back to baseline of around ~2.2-2.3 (which still represents significant chronic kidney disease stage IV). Given how minimal her edema was on exam, I would prefer to try conservative measures first to improve her swelling. As we discussed in clinic, need to limit sodium to 2000mg  per day. (She is eating a LOT of processed foods including cold cuts.) and limit fluid to less than 64 oz per day. Please keep legs elevated and use compression hose. If swelling is no better within 1 week she can call back to discuss with Dr. Purvis Sheffield. She would also benefit from a referral to nephrology for her kidney disease - please arrange.  Dontavia Brand PA-C  ______

## 2016-02-10 ENCOUNTER — Encounter: Payer: Self-pay | Admitting: Cardiovascular Disease

## 2016-02-10 ENCOUNTER — Ambulatory Visit (INDEPENDENT_AMBULATORY_CARE_PROVIDER_SITE_OTHER): Payer: Medicare Other | Admitting: Cardiovascular Disease

## 2016-02-10 VITALS — BP 166/87 | HR 62 | Ht 67.5 in | Wt 151.8 lb

## 2016-02-10 DIAGNOSIS — E785 Hyperlipidemia, unspecified: Secondary | ICD-10-CM

## 2016-02-10 DIAGNOSIS — I4821 Permanent atrial fibrillation: Secondary | ICD-10-CM

## 2016-02-10 DIAGNOSIS — I701 Atherosclerosis of renal artery: Secondary | ICD-10-CM | POA: Diagnosis not present

## 2016-02-10 DIAGNOSIS — I482 Chronic atrial fibrillation: Secondary | ICD-10-CM

## 2016-02-10 DIAGNOSIS — I739 Peripheral vascular disease, unspecified: Secondary | ICD-10-CM | POA: Diagnosis not present

## 2016-02-10 DIAGNOSIS — I10 Essential (primary) hypertension: Secondary | ICD-10-CM

## 2016-02-10 NOTE — Assessment & Plan Note (Signed)
History of hypertension blood pressure measured at 166/87. She is on amlodipine and labetalol as well as lisinopril. Continue current meds at current dosing

## 2016-02-10 NOTE — Assessment & Plan Note (Signed)
History of 70% right renal artery stenosis demonstrated during angiography in 2009. We have been following her duplex ultrasound in the past but given her renal insufficiency at this point I feel comfortable not following this further.

## 2016-02-10 NOTE — Patient Instructions (Signed)
Your physician wants you to follow-up in: 3 Months [name] You will receive a reminder letter in the mail two months in advance. If you don't receive a letter, please call our office to schedule the follow-up appointment.

## 2016-02-10 NOTE — Assessment & Plan Note (Signed)
History of hyperlipidemia on statin therapy followed by her PCP. 

## 2016-02-10 NOTE — Assessment & Plan Note (Signed)
History of chronic atrial fibrillation rate controlled on Eliquis  oral anticoagulation low-dose adjusted for moderate renal insufficiency

## 2016-02-10 NOTE — Assessment & Plan Note (Signed)
History of peripheral arterial disease status post bilateral iliac stenting with known SFA disease. We have been following her Ultrasound the past. Patient does complain of lifestyle limiting claudication. Because of her renal insufficiency however I do not think she is a candidate for angiography or intervention in the future

## 2016-02-10 NOTE — Assessment & Plan Note (Signed)
History of COPD with ongoing tobacco abuse recalcitrant risk factor modification. 

## 2016-02-10 NOTE — Progress Notes (Signed)
02/10/2016 [name]   [birth year]  [id number]  Primary Physician Bethany GullyFANTA,[name], MD Primary Cardiologist: Runell GessJonathan J. Aristeo Hankerson MD Roseanne RenoFACP,FACC,FAHA, FSCAI   HPI:  The patient is a 82 year old mildly overweight, widowed [place] female, mother of 2, grandmother to 2 grandchildren, who I saw 07/19/14. She has a history of continued tobacco abuse, hypertension, hyperlipidemia, and peripheral vascular occlusive disease. She has a known 70% right renal artery stenosis, which we have been following by duplex ultrasound, as well as bilateral iliac and SFA disease. She has known occluded SFAs bilaterally and has had bilateral iliac stenting for chronic total occlusions in 2009 and 2010. She currently denies claudication. We have been following her Dopplers annually. She did have a negative Myoview, February 12, 2010, with normal LV function by 2D echocardiography. Recent Dopplers reveal ABIs in the 0.5 to 0.6 range bilaterally with progression of disease in her right external iliac artery, suggesting "in-stent restenosis," as well as her left external iliac artery. When I saw her last she is noted to be in atrial fibrillation. I elected to begin her on Coumadin anticoagulation. She has had coagulopathy with GI bleed requiring reverse with vitamin K. Ultimately she was changed to Eliquis. She said this is the comfort TIA and syncope. Workup including MRI of the head, CT of the head and carotid Dopplers were unrevealing. She had less than 50% bilateral internal carotid artery stenosis. She was also diagnosed with COPD and continues to smoke several cigarettes a day.. She denies chest pain..she sees Dr. Purvis SheffieldKoneswaran in our [place] office. Since I saw her last she remained clinically stable. She has chronic shortness of breath but denies chest pain. She also complains of claudication. Her serum creatinines are in the mid to high 2 range and she is followed by [place] nephrolog[place] in [place]..   Current  Outpatient Prescriptions  Medication Sig Dispense Refill  . albuterol (PROVENTIL HFA;VENTOLIN HFA) 108 (90 BASE) MCG/ACT inhaler Inhale 2 puffs into the lungs every 6 (six) hours as needed for wheezing. 1 Inhaler 2  . amLODipine (NORVASC) 10 MG tablet Take 1 tablet (10 mg total) by mouth daily. 90 tablet 3  . apixaban (ELIQUIS) 2.5 MG TABS tablet Take 2.5 mg by mouth 2 (two) times daily.    . Fluticasone Furoate-Vilanterol (BREO ELLIPTA) 100-25 MCG/INH AEPB Inhale 1 puff into the lungs daily.    . furosemide (LASIX) 40 MG tablet Take 1 tablet (40 mg total) by mouth daily. 30 tablet 3  . labetalol (NORMODYNE) 300 MG tablet Take 300 mg by mouth 2 (two) times daily.     Marland Kitchen. lisinopril (PRINIVIL,ZESTRIL) 40 MG tablet Take 1 tablet (40 mg total) by mouth daily. 30 tablet 3  . potassium chloride SA (K-DUR,KLOR-CON) 20 MEQ tablet Take 2 tablets (40 mEq total) by mouth 2 (two) times daily. 60 tablet 3  . simvastatin (ZOCOR) 20 MG tablet Take 20 mg by mouth every evening.    . tiotropium (SPIRIVA) 18 MCG inhalation capsule Place 18 mcg into inhaler and inhale every morning.    . Vitamin D, Cholecalciferol, 1000 UNITS TABS Take 1 capsule by mouth daily.     No current facility-administered medications for this visit.    No Known Allergies  Social History   Social History  . Marital Status: Widowed    Spouse Name: N/A  . Number of Children: 2  . Years of Education: N/A   Occupational History  .     Social History Main Topics  .  Smoking status: Current Some Day Smoker -- 0.50 packs/day for 35 years    Types: Cigarettes    Start date: 04/16/1952  . Smokeless tobacco: Not on file  . Alcohol Use: No  . Drug Use: No  . Sexual Activity: No   Other Topics Concern  . Not on file   Social History Narrative     Review of Systems: General: negative for chills, fever, night sweats or weight changes.  Cardiovascular: negative for chest pain, dyspnea on exertion, edema, orthopnea, palpitations,  paroxysmal nocturnal dyspnea or shortness of breath Dermatological: negative for rash Respiratory: negative for cough or wheezing Urologic: negative for hematuria Abdominal: negative for nausea, vomiting, diarrhea, bright red blood per rectum, melena, or hematemesis Neurologic: negative for visual changes, syncope, or dizziness All other systems reviewed and are otherwise negative except as noted above.    Blood pressure 166/87, pulse 62, height 5' 7.5" (1.715 m), weight 151 lb 12.8 oz (68.856 kg).  General appearance: alert and no distress Neck: no adenopathy, no carotid bruit, no JVD, supple, symmetrical, trachea midline and thyroid not enlarged, symmetric, no tenderness/mass/nodules Lungs: clear to auscultation bilaterally Heart: irregularly irregular rhythm Extremities: extremities normal, atraumatic, no cyanosis or edema  EKG not performed today  ASSESSMENT AND PLAN:   Permanent atrial fibrillation (HCC) History of chronic atrial fibrillation rate controlled on Eliquis  oral anticoagulation low-dose adjusted for moderate renal insufficiency  Dyslipidemia History of hyperlipidemia on statin therapy followed by her PCP  COPD (chronic obstructive pulmonary disease) (HCC) History of COPD with ongoing tobacco abuse recalcitrant risk factor modification  Peripheral vascular disease with claudication History of peripheral arterial disease status post bilateral iliac stenting with known SFA disease. We have been following her Ultrasound the past. Patient does complain of lifestyle limiting claudication. Because of her renal insufficiency however I do not think she is a candidate for angiography or intervention in the future  Renal artery stenosis History of 70% right renal artery stenosis demonstrated during angiography in 2009. We have been following her duplex ultrasound in the past but given her renal insufficiency at this point I feel comfortable not following this  further.  Essential hypertension History of hypertension blood pressure measured at 166/87. She is on amlodipine and labetalol as well as lisinopril. Continue current meds at current dosing      Runell Gess MD [facility], [facility] 02/10/2016 11:12 AM

## 2016-05-10 ENCOUNTER — Ambulatory Visit (INDEPENDENT_AMBULATORY_CARE_PROVIDER_SITE_OTHER): Payer: Medicare Other | Admitting: Cardiovascular Disease

## 2016-05-10 ENCOUNTER — Encounter: Payer: Self-pay | Admitting: Cardiovascular Disease

## 2016-05-10 VITALS — BP 140/74 | HR 80 | Ht 67.5 in | Wt 147.0 lb

## 2016-05-10 DIAGNOSIS — I701 Atherosclerosis of renal artery: Secondary | ICD-10-CM

## 2016-05-10 DIAGNOSIS — I4821 Permanent atrial fibrillation: Secondary | ICD-10-CM

## 2016-05-10 DIAGNOSIS — I5032 Chronic diastolic (congestive) heart failure: Secondary | ICD-10-CM | POA: Diagnosis not present

## 2016-05-10 DIAGNOSIS — I739 Peripheral vascular disease, unspecified: Secondary | ICD-10-CM

## 2016-05-10 DIAGNOSIS — E785 Hyperlipidemia, unspecified: Secondary | ICD-10-CM

## 2016-05-10 DIAGNOSIS — Z720 Tobacco use: Secondary | ICD-10-CM

## 2016-05-10 DIAGNOSIS — I482 Chronic atrial fibrillation: Secondary | ICD-10-CM | POA: Diagnosis not present

## 2016-05-10 DIAGNOSIS — N184 Chronic kidney disease, stage 4 (severe): Secondary | ICD-10-CM

## 2016-05-10 DIAGNOSIS — I10 Essential (primary) hypertension: Secondary | ICD-10-CM

## 2016-05-10 NOTE — Patient Instructions (Signed)

## 2016-05-10 NOTE — Progress Notes (Signed)
Patient ID: Bethany Fitzichetta N Holden, female   DOB: Dec 20, 1930, 82 y.o.   MRN: [id number]      SUBJECTIVE: The patient is an 82 year old woman with a history of permanent atrial fibrillation, chronic diastolic heart failure, dyslipidemia, COPD, tobacco abuse, peripheral vascular disease with claudication, renal artery stenosis, and malignant hypertension. She was evaluated by Dr. Allyson SabalBerry on 02/10/16. Because of her renal insufficiency he did not think she was either a candidate for angiography or intervention in the future.  She denies chest pain and worsening of her chronic exertional dyspnea. Systolic blood pressures at home have ranged from 150-165. Today blood pressure is 140/74.   Review of Systems: As per "subjective", otherwise negative.  No Known Allergies  Current Outpatient Prescriptions  Medication Sig Dispense Refill  . albuterol (PROVENTIL HFA;VENTOLIN HFA) 108 (90 BASE) MCG/ACT inhaler Inhale 2 puffs into the lungs every 6 (six) hours as needed for wheezing. 1 Inhaler 2  . amLODipine (NORVASC) 2.5 MG tablet Take 2.5 mg by mouth daily.    Marland Kitchen. amLODipine (NORVASC) 5 MG tablet Take 5 mg by mouth daily.    Marland Kitchen. apixaban (ELIQUIS) 2.5 MG TABS tablet Take 2.5 mg by mouth 2 (two) times daily.    . Fluticasone Furoate-Vilanterol (BREO ELLIPTA) 100-25 MCG/INH AEPB Inhale 1 puff into the lungs daily.    . furosemide (LASIX) 40 MG tablet Take 1 tablet (40 mg total) by mouth daily. 30 tablet 3  . labetalol (NORMODYNE) 300 MG tablet Take 300 mg by mouth 2 (two) times daily.     Marland Kitchen. lisinopril (PRINIVIL,ZESTRIL) 40 MG tablet Take 1 tablet (40 mg total) by mouth daily. 30 tablet 3  . Multiple Vitamin (MULTI VITAMIN DAILY) TABS Take by mouth.    . potassium chloride SA (K-DUR,KLOR-CON) 20 MEQ tablet Take 2 tablets (40 mEq total) by mouth 2 (two) times daily. 60 tablet 3  . simvastatin (ZOCOR) 20 MG tablet Take 20 mg by mouth every evening.    . tiotropium (SPIRIVA) 18 MCG inhalation capsule Place 18 mcg  into inhaler and inhale every morning.    . Vitamin D, Cholecalciferol, 1000 UNITS TABS Take 1 capsule by mouth daily.     No current facility-administered medications for this visit.    Past Medical History  Diagnosis Date  . Mixed hyperlipidemia   . Normal cardiac stress test 2011  . PVD (peripheral vascular disease) (HCC)     a. s/p bilateral iliac stenting (followed by Dr. Allyson SabalBerry).  . Essential hypertension, benign   . DVT (deep venous thrombosis) (HCC) 05/2008  . Bilateral iliac artery occlusion (HCC)   . Aortic valve sclerosis   . Persistent atrial fibrillation (HCC)     History of GI bleed on Coumadin  . Acute respiratory failure (HCC)   . COPD (chronic obstructive pulmonary disease) (HCC)   . Tobacco abuse   . Syncope, near   . Orthostatic hypotension   . Chronic diastolic CHF (congestive heart failure) (HCC)   . Stroke (HCC) 03/2013    Reportedly mild  . Renal artery stenosis (HCC)     a. 70% right renal artery stenosis during angiography performed in 2009. b. Last renal duplex 07/2014: 1-59% reduction in R renal artery, >60% diameter reduction in L renal artery, repeat recommended 6 months.  . Hypertensive heart disease   . CKD (chronic kidney disease), stage IV (HCC)   . Pericardial effusion     a. 2D Echo 03/2015: EF 65-70%, mod-severe concentric LVH, mild-mod MR, severely dilated LA, mildly  reduced RV systolic function, mod dilated RA, small-moderate pericardial effusion (stable).    Past Surgical History  Procedure Laterality Date  . Iliac artery stent Left 05/21/2008    Dr. Allyson SabalBerry  . Iliac artery stent Right 01/07/2009    Dr. Allyson SabalBerry  . Brain tumor excision  1999    Hemangioma [facility]  . Cataract extraction Bilateral   . Lens replacement Left   . Doppler echocardiography  02/12/2010    EF =>55%  . Nm myocar perf wall motion  02/12/2010    PERSANTINE: EF 57%;LV norm  . Lea doppler  09/13/2012    Right EIA stent >60%;rgt SFA occluded at prox. and mid level  w/reconstitution at distal SFA/pop.;rgt PTA occlude;lft EIAstent >50%;lft mid SFA occluded;lft ATA & PTA occluded  . Renal duplex doppler  02/25/2012    Right 60-99%;lft renal artery no evidence siginificant reduction;rgt & lft kidneys norm size     Social History   Social History  . Marital Status: Widowed    Spouse Name: N/A  . Number of Children: 2  . Years of Education: N/A   Occupational History  .     Social History Main Topics  . Smoking status: Current Some Day Smoker -- 0.50 packs/day for 35 years    Types: Cigarettes    Start date: 04/16/1952  . Smokeless tobacco: Not on file  . Alcohol Use: No  . Drug Use: No  . Sexual Activity: No   Other Topics Concern  . Not on file   Social History Narrative     Filed Vitals:   05/10/16 1358  BP: 140/74  Pulse: 80  Height: 5' 7.5" (1.715 m)  Weight: 147 lb (66.679 kg)  SpO2: 92%    PHYSICAL EXAM General: NAD HEENT: Normal. Neck: No JVD, no thyromegaly. Lungs: Diminished throughout, no rales/wheezes. CV: Nondisplaced PMI.  Regular rate and irregular rhythm, normal S1/S2, no S3, no murmur. No pretibial or periankle edema.     Abdomen: Soft, nontender, no distention.  Neurologic: Alert and oriented.  Psych: Normal affect. Skin: Normal. Musculoskeletal: No gross deformities.    ECG: Most recent ECG reviewed.      ASSESSMENT AND PLAN:  Permanent atrial fibrillation  Stable. The patient was previously placed on Coumadin but developed a GI bleed and Coumadin coagulopathy, requiring reversal with vitamin K. Ultimately she was changed to Eliquis at a reduced dose because of renal function. She is tolerating it well without complications.   Peripheral vascular disease with claudication  Not a candidate for angiography or intervention per Dr. Allyson SabalBerry due to renal insufficiency.  Malignant hypertension  Well controlled today for age on current therapy. I have asked her daughter to check it three times per  week and to inform me if SBP is consistently > 150 mmHg. Of note, she does have a 70% right renal artery stenosis which is likely contributing to her refractory HTN.   Chronic diastolic heart failure  Currently euvolemic. She takes Lasix 40 mg daily with KCl. Will aim for optimal control of BP.   Dispo: f/u 6 months   Prentice DockerSuresh Koneswaran, M.D., F.A.C.C.

## 2016-07-14 ENCOUNTER — Inpatient Hospital Stay (HOSPITAL_COMMUNITY): Payer: Medicare Other

## 2016-07-14 ENCOUNTER — Emergency Department (HOSPITAL_COMMUNITY): Payer: Medicare Other

## 2016-07-14 ENCOUNTER — Inpatient Hospital Stay (HOSPITAL_COMMUNITY)
Admission: EM | Admit: 2016-07-14 | Discharge: 2016-08-08 | DRG: 023 | Disposition: E | Payer: Medicare Other | Attending: Neurology | Admitting: Neurology

## 2016-07-14 ENCOUNTER — Emergency Department (HOSPITAL_COMMUNITY): Payer: Medicare Other | Admitting: Certified Registered Nurse Anesthetist

## 2016-07-14 ENCOUNTER — Encounter (HOSPITAL_COMMUNITY): Payer: Self-pay | Admitting: *Deleted

## 2016-07-14 ENCOUNTER — Encounter (HOSPITAL_COMMUNITY): Admission: EM | Disposition: E | Payer: Self-pay | Source: Home / Self Care | Attending: Neurology

## 2016-07-14 DIAGNOSIS — R531 Weakness: Secondary | ICD-10-CM

## 2016-07-14 DIAGNOSIS — Z79899 Other long term (current) drug therapy: Secondary | ICD-10-CM

## 2016-07-14 DIAGNOSIS — Z7901 Long term (current) use of anticoagulants: Secondary | ICD-10-CM | POA: Diagnosis not present

## 2016-07-14 DIAGNOSIS — N179 Acute kidney failure, unspecified: Secondary | ICD-10-CM | POA: Diagnosis present

## 2016-07-14 DIAGNOSIS — N184 Chronic kidney disease, stage 4 (severe): Secondary | ICD-10-CM | POA: Diagnosis present

## 2016-07-14 DIAGNOSIS — R4701 Aphasia: Secondary | ICD-10-CM | POA: Diagnosis present

## 2016-07-14 DIAGNOSIS — I5032 Chronic diastolic (congestive) heart failure: Secondary | ICD-10-CM | POA: Diagnosis present

## 2016-07-14 DIAGNOSIS — I480 Paroxysmal atrial fibrillation: Secondary | ICD-10-CM | POA: Diagnosis present

## 2016-07-14 DIAGNOSIS — I481 Persistent atrial fibrillation: Secondary | ICD-10-CM | POA: Diagnosis present

## 2016-07-14 DIAGNOSIS — E782 Mixed hyperlipidemia: Secondary | ICD-10-CM | POA: Diagnosis present

## 2016-07-14 DIAGNOSIS — G936 Cerebral edema: Secondary | ICD-10-CM

## 2016-07-14 DIAGNOSIS — I63312 Cerebral infarction due to thrombosis of left middle cerebral artery: Secondary | ICD-10-CM

## 2016-07-14 DIAGNOSIS — R4020 Unspecified coma: Secondary | ICD-10-CM | POA: Diagnosis present

## 2016-07-14 DIAGNOSIS — Z9842 Cataract extraction status, left eye: Secondary | ICD-10-CM

## 2016-07-14 DIAGNOSIS — F17210 Nicotine dependence, cigarettes, uncomplicated: Secondary | ICD-10-CM | POA: Diagnosis present

## 2016-07-14 DIAGNOSIS — I63412 Cerebral infarction due to embolism of left middle cerebral artery: Principal | ICD-10-CM | POA: Diagnosis present

## 2016-07-14 DIAGNOSIS — Z9841 Cataract extraction status, right eye: Secondary | ICD-10-CM

## 2016-07-14 DIAGNOSIS — G935 Compression of brain: Secondary | ICD-10-CM | POA: Diagnosis present

## 2016-07-14 DIAGNOSIS — R29810 Facial weakness: Secondary | ICD-10-CM | POA: Diagnosis present

## 2016-07-14 DIAGNOSIS — I130 Hypertensive heart and chronic kidney disease with heart failure and stage 1 through stage 4 chronic kidney disease, or unspecified chronic kidney disease: Secondary | ICD-10-CM | POA: Diagnosis present

## 2016-07-14 DIAGNOSIS — I63512 Cerebral infarction due to unspecified occlusion or stenosis of left middle cerebral artery: Secondary | ICD-10-CM | POA: Diagnosis present

## 2016-07-14 DIAGNOSIS — J9600 Acute respiratory failure, unspecified whether with hypoxia or hypercapnia: Secondary | ICD-10-CM

## 2016-07-14 DIAGNOSIS — Z66 Do not resuscitate: Secondary | ICD-10-CM | POA: Diagnosis present

## 2016-07-14 DIAGNOSIS — I482 Chronic atrial fibrillation: Secondary | ICD-10-CM | POA: Diagnosis not present

## 2016-07-14 DIAGNOSIS — G8191 Hemiplegia, unspecified affecting right dominant side: Secondary | ICD-10-CM | POA: Diagnosis present

## 2016-07-14 DIAGNOSIS — Z7951 Long term (current) use of inhaled steroids: Secondary | ICD-10-CM

## 2016-07-14 DIAGNOSIS — Z515 Encounter for palliative care: Secondary | ICD-10-CM | POA: Diagnosis present

## 2016-07-14 DIAGNOSIS — M6289 Other specified disorders of muscle: Secondary | ICD-10-CM | POA: Diagnosis not present

## 2016-07-14 DIAGNOSIS — I358 Other nonrheumatic aortic valve disorders: Secondary | ICD-10-CM | POA: Diagnosis present

## 2016-07-14 DIAGNOSIS — Z8673 Personal history of transient ischemic attack (TIA), and cerebral infarction without residual deficits: Secondary | ICD-10-CM

## 2016-07-14 DIAGNOSIS — I161 Hypertensive emergency: Secondary | ICD-10-CM | POA: Diagnosis present

## 2016-07-14 DIAGNOSIS — J9601 Acute respiratory failure with hypoxia: Secondary | ICD-10-CM | POA: Diagnosis not present

## 2016-07-14 DIAGNOSIS — R29731 NIHSS score 31: Secondary | ICD-10-CM | POA: Diagnosis present

## 2016-07-14 DIAGNOSIS — Q251 Coarctation of aorta: Secondary | ICD-10-CM | POA: Diagnosis not present

## 2016-07-14 DIAGNOSIS — J9621 Acute and chronic respiratory failure with hypoxia: Secondary | ICD-10-CM | POA: Diagnosis not present

## 2016-07-14 DIAGNOSIS — J9690 Respiratory failure, unspecified, unspecified whether with hypoxia or hypercapnia: Secondary | ICD-10-CM | POA: Diagnosis present

## 2016-07-14 DIAGNOSIS — J449 Chronic obstructive pulmonary disease, unspecified: Secondary | ICD-10-CM | POA: Diagnosis present

## 2016-07-14 DIAGNOSIS — E1165 Type 2 diabetes mellitus with hyperglycemia: Secondary | ICD-10-CM | POA: Diagnosis present

## 2016-07-14 DIAGNOSIS — Z01818 Encounter for other preprocedural examination: Secondary | ICD-10-CM

## 2016-07-14 DIAGNOSIS — I739 Peripheral vascular disease, unspecified: Secondary | ICD-10-CM | POA: Diagnosis present

## 2016-07-14 DIAGNOSIS — E872 Acidosis: Secondary | ICD-10-CM | POA: Diagnosis present

## 2016-07-14 DIAGNOSIS — I10 Essential (primary) hypertension: Secondary | ICD-10-CM | POA: Diagnosis not present

## 2016-07-14 DIAGNOSIS — I701 Atherosclerosis of renal artery: Secondary | ICD-10-CM | POA: Diagnosis present

## 2016-07-14 DIAGNOSIS — I63032 Cerebral infarction due to thrombosis of left carotid artery: Secondary | ICD-10-CM | POA: Diagnosis not present

## 2016-07-14 DIAGNOSIS — I6522 Occlusion and stenosis of left carotid artery: Secondary | ICD-10-CM | POA: Diagnosis present

## 2016-07-14 DIAGNOSIS — I639 Cerebral infarction, unspecified: Secondary | ICD-10-CM

## 2016-07-14 DIAGNOSIS — M7981 Nontraumatic hematoma of soft tissue: Secondary | ICD-10-CM | POA: Diagnosis present

## 2016-07-14 DIAGNOSIS — D6489 Other specified anemias: Secondary | ICD-10-CM | POA: Diagnosis present

## 2016-07-14 DIAGNOSIS — J988 Other specified respiratory disorders: Secondary | ICD-10-CM | POA: Diagnosis not present

## 2016-07-14 HISTORY — PX: IR GENERIC HISTORICAL: IMG1180011

## 2016-07-14 HISTORY — PX: RADIOLOGY WITH ANESTHESIA: SHX6223

## 2016-07-14 LAB — RAPID URINE DRUG SCREEN, HOSP PERFORMED
Amphetamines: NOT DETECTED
BARBITURATES: NOT DETECTED
BENZODIAZEPINES: NOT DETECTED
COCAINE: NOT DETECTED
Opiates: NOT DETECTED
TETRAHYDROCANNABINOL: NOT DETECTED

## 2016-07-14 LAB — POCT I-STAT 3, ART BLOOD GAS (G3+)
ACID-BASE DEFICIT: 4 mmol/L — AB (ref 0.0–2.0)
Bicarbonate: 22.7 mmol/L (ref 20.0–28.0)
O2 SAT: 99 %
Patient temperature: 97.3
TCO2: 24 mmol/L (ref 0–100)
pCO2 arterial: 46.9 mmHg (ref 32.0–48.0)
pH, Arterial: 7.29 — ABNORMAL LOW (ref 7.350–7.450)
pO2, Arterial: 141 mmHg — ABNORMAL HIGH (ref 83.0–108.0)

## 2016-07-14 LAB — DIFFERENTIAL
BASOS ABS: 0 10*3/uL (ref 0.0–0.1)
BASOS PCT: 0 %
EOS ABS: 0 10*3/uL (ref 0.0–0.7)
EOS PCT: 0 %
LYMPHS ABS: 1.6 10*3/uL (ref 0.7–4.0)
Lymphocytes Relative: 22 %
MONO ABS: 0.4 10*3/uL (ref 0.1–1.0)
MONOS PCT: 5 %
Neutro Abs: 5.2 10*3/uL (ref 1.7–7.7)
Neutrophils Relative %: 73 %

## 2016-07-14 LAB — COMPREHENSIVE METABOLIC PANEL
ALBUMIN: 3.7 g/dL (ref 3.5–5.0)
ALK PHOS: 88 U/L (ref 38–126)
ALT: 19 U/L (ref 14–54)
ANION GAP: 4 — AB (ref 5–15)
AST: 24 U/L (ref 15–41)
BILIRUBIN TOTAL: 1.1 mg/dL (ref 0.3–1.2)
BUN: 59 mg/dL — ABNORMAL HIGH (ref 6–20)
CALCIUM: 9 mg/dL (ref 8.9–10.3)
CO2: 27 mmol/L (ref 22–32)
Chloride: 107 mmol/L (ref 101–111)
Creatinine, Ser: 3.34 mg/dL — ABNORMAL HIGH (ref 0.44–1.00)
GFR calc non Af Amer: 12 mL/min — ABNORMAL LOW (ref >60)
GFR, EST AFRICAN AMERICAN: 14 mL/min — AB (ref >60)
GLUCOSE: 168 mg/dL — AB (ref 65–99)
POTASSIUM: 3.6 mmol/L (ref 3.5–5.1)
SODIUM: 138 mmol/L (ref 135–145)
TOTAL PROTEIN: 7.4 g/dL (ref 6.5–8.1)

## 2016-07-14 LAB — URINALYSIS, ROUTINE W REFLEX MICROSCOPIC
Bilirubin Urine: NEGATIVE
GLUCOSE, UA: 250 mg/dL — AB
KETONES UR: NEGATIVE mg/dL
Leukocytes, UA: NEGATIVE
Nitrite: NEGATIVE
PH: 6.5 (ref 5.0–8.0)
Specific Gravity, Urine: 1.02 (ref 1.005–1.030)

## 2016-07-14 LAB — CBC
HCT: 46 % (ref 36.0–46.0)
Hemoglobin: 14.8 g/dL (ref 12.0–15.0)
MCH: 28.4 pg (ref 26.0–34.0)
MCHC: 32.2 g/dL (ref 30.0–36.0)
MCV: 88.1 fL (ref 78.0–100.0)
Platelets: 183 10*3/uL (ref 150–400)
RBC: 5.22 MIL/uL — ABNORMAL HIGH (ref 3.87–5.11)
RDW: 14.3 % (ref 11.5–15.5)
WBC: 7.1 10*3/uL (ref 4.0–10.5)

## 2016-07-14 LAB — URINE MICROSCOPIC-ADD ON

## 2016-07-14 LAB — PROTIME-INR
INR: 1.01
Prothrombin Time: 13.3 seconds (ref 11.4–15.2)

## 2016-07-14 LAB — ETHANOL

## 2016-07-14 LAB — MRSA PCR SCREENING: MRSA by PCR: NEGATIVE

## 2016-07-14 LAB — CBG MONITORING, ED: Glucose-Capillary: 148 mg/dL — ABNORMAL HIGH (ref 65–99)

## 2016-07-14 LAB — APTT: APTT: 29 s (ref 24–36)

## 2016-07-14 SURGERY — RADIOLOGY WITH ANESTHESIA
Anesthesia: General

## 2016-07-14 MED ORDER — ACETAMINOPHEN 500 MG PO TABS
1000.0000 mg | ORAL_TABLET | Freq: Four times a day (QID) | ORAL | Status: DC | PRN
  Administered 2016-07-15: 1000 mg via ORAL
  Filled 2016-07-14: qty 2

## 2016-07-14 MED ORDER — CHLORHEXIDINE GLUCONATE 0.12 % MT SOLN
15.0000 mL | Freq: Two times a day (BID) | OROMUCOSAL | Status: DC
  Administered 2016-07-14 – 2016-07-16 (×4): 15 mL via OROMUCOSAL

## 2016-07-14 MED ORDER — PROPOFOL 10 MG/ML IV BOLUS
INTRAVENOUS | Status: DC | PRN
  Administered 2016-07-14: 70 mg via INTRAVENOUS

## 2016-07-14 MED ORDER — NICARDIPINE HCL IN NACL 20-0.86 MG/200ML-% IV SOLN: 5.0000 mg/h | INTRAVENOUS | Status: DC

## 2016-07-14 MED ORDER — ACETAMINOPHEN 650 MG RE SUPP: 650.0000 mg | Freq: Four times a day (QID) | RECTAL | Status: DC | PRN

## 2016-07-14 MED ORDER — EPTIFIBATIDE 20 MG/10ML IV SOLN
INTRAVENOUS | Status: DC | PRN
  Administered 2016-07-14: 1.8 mg via INTRAVENOUS

## 2016-07-14 MED ORDER — SODIUM CHLORIDE 0.9 % IV SOLN
INTRAVENOUS | Status: DC | PRN
  Administered 2016-07-14 (×2): via INTRAVENOUS

## 2016-07-14 MED ORDER — ALBUTEROL SULFATE (2.5 MG/3ML) 0.083% IN NEBU: 2.5000 mg | INHALATION_SOLUTION | RESPIRATORY_TRACT | Status: DC | PRN

## 2016-07-14 MED ORDER — HYDRALAZINE HCL 20 MG/ML IJ SOLN
10.0000 mg | INTRAMUSCULAR | Status: DC | PRN
  Administered 2016-07-14 – 2016-07-15 (×3): 20 mg via INTRAVENOUS
  Filled 2016-07-14 (×3): qty 1

## 2016-07-14 MED ORDER — EPTIFIBATIDE 20 MG/10ML IV SOLN
INTRAVENOUS | Status: AC
  Filled 2016-07-14: qty 10

## 2016-07-14 MED ORDER — FAMOTIDINE IN NACL 20-0.9 MG/50ML-% IV SOLN
20.0000 mg | INTRAVENOUS | Status: DC
  Administered 2016-07-14 – 2016-07-15 (×2): 20 mg via INTRAVENOUS
  Filled 2016-07-14 (×2): qty 50

## 2016-07-14 MED ORDER — CEFAZOLIN SODIUM-DEXTROSE 2-4 GM/100ML-% IV SOLN
INTRAVENOUS | Status: AC
  Filled 2016-07-14: qty 100

## 2016-07-14 MED ORDER — FENTANYL CITRATE (PF) 100 MCG/2ML IJ SOLN
INTRAMUSCULAR | Status: DC | PRN
  Administered 2016-07-14: 50 ug via INTRAVENOUS
  Administered 2016-07-14: 100 ug via INTRAVENOUS
  Administered 2016-07-14: 50 ug via INTRAVENOUS

## 2016-07-14 MED ORDER — BUDESONIDE 0.25 MG/2ML IN SUSP
0.2500 mg | Freq: Four times a day (QID) | RESPIRATORY_TRACT | Status: DC
  Administered 2016-07-14 (×2): 0.25 mg via RESPIRATORY_TRACT
  Filled 2016-07-14 (×3): qty 2

## 2016-07-14 MED ORDER — IOPAMIDOL (ISOVUE-300) INJECTION 61%
INTRAVENOUS | Status: AC
  Filled 2016-07-14: qty 300

## 2016-07-14 MED ORDER — IPRATROPIUM-ALBUTEROL 0.5-2.5 (3) MG/3ML IN SOLN
3.0000 mL | Freq: Four times a day (QID) | RESPIRATORY_TRACT | Status: DC
  Administered 2016-07-14 – 2016-07-18 (×16): 3 mL via RESPIRATORY_TRACT
  Filled 2016-07-14 (×15): qty 3

## 2016-07-14 MED ORDER — ORAL CARE MOUTH RINSE
15.0000 mL | Freq: Four times a day (QID) | OROMUCOSAL | Status: DC
  Administered 2016-07-15 – 2016-07-16 (×7): 15 mL via OROMUCOSAL

## 2016-07-14 MED ORDER — LABETALOL HCL 5 MG/ML IV SOLN
20.0000 mg | INTRAVENOUS | Status: DC | PRN
  Administered 2016-07-14 – 2016-07-15 (×2): 20 mg via INTRAVENOUS
  Filled 2016-07-14 (×2): qty 4

## 2016-07-14 MED ORDER — NITROGLYCERIN 1 MG/10 ML FOR IR/CATH LAB
INTRA_ARTERIAL | Status: AC
Start: 2016-07-14 — End: 2016-07-15
  Filled 2016-07-14: qty 10

## 2016-07-14 MED ORDER — ESMOLOL HCL 100 MG/10ML IV SOLN
INTRAVENOUS | Status: DC | PRN
  Administered 2016-07-14: 10 mg via INTRAVENOUS

## 2016-07-14 MED ORDER — ONDANSETRON HCL 4 MG/2ML IJ SOLN: 4.0000 mg | Freq: Four times a day (QID) | INTRAMUSCULAR | Status: DC | PRN

## 2016-07-14 MED ORDER — BUDESONIDE 0.25 MG/2ML IN SUSP
0.2500 mg | Freq: Four times a day (QID) | RESPIRATORY_TRACT | Status: DC
  Administered 2016-07-15 – 2016-07-18 (×14): 0.25 mg via RESPIRATORY_TRACT
  Filled 2016-07-14 (×16): qty 2

## 2016-07-14 MED ORDER — SUCCINYLCHOLINE CHLORIDE 20 MG/ML IJ SOLN
INTRAMUSCULAR | Status: DC | PRN
  Administered 2016-07-14: 80 mg via INTRAVENOUS

## 2016-07-14 MED ORDER — LABETALOL HCL 5 MG/ML IV SOLN
INTRAVENOUS | Status: AC
  Administered 2016-07-14: 20 mg via INTRAVENOUS
  Filled 2016-07-14: qty 4

## 2016-07-14 MED ORDER — CEFAZOLIN SODIUM-DEXTROSE 2-3 GM-% IV SOLR
INTRAVENOUS | Status: DC | PRN
Start: 2016-07-14 — End: 2016-07-14
  Administered 2016-07-14: 2 g via INTRAVENOUS

## 2016-07-14 MED ORDER — SODIUM CHLORIDE 0.9 % IJ SOLN
INTRAVENOUS | Status: DC | PRN
  Administered 2016-07-14: 25 ug via INTRA_ARTERIAL

## 2016-07-14 MED ORDER — CLEVIDIPINE BUTYRATE 0.5 MG/ML IV EMUL
0.0000 mg/h | INTRAVENOUS | Status: DC
  Administered 2016-07-14: 1 mg/h via INTRAVENOUS
  Administered 2016-07-15 (×3): 18 mg/h via INTRAVENOUS
  Administered 2016-07-15: 2 mg/h via INTRAVENOUS
  Administered 2016-07-15 (×2): 17 mg/h via INTRAVENOUS
  Administered 2016-07-15: 5 mg/h via INTRAVENOUS
  Administered 2016-07-15: 10 mg/h via INTRAVENOUS
  Administered 2016-07-16 (×2): 16 mg/h via INTRAVENOUS
  Filled 2016-07-14 (×4): qty 50
  Filled 2016-07-14: qty 100
  Filled 2016-07-14: qty 50
  Filled 2016-07-14: qty 100
  Filled 2016-07-14 (×4): qty 50
  Filled 2016-07-14 (×2): qty 100
  Filled 2016-07-14: qty 50
  Filled 2016-07-14: qty 100

## 2016-07-14 MED ORDER — EPTIFIBATIDE 20 MG/10ML IV SOLN
1.8000 mg | Freq: Once | INTRAVENOUS | Status: DC
  Filled 2016-07-14: qty 10

## 2016-07-14 MED ORDER — ROCURONIUM BROMIDE 100 MG/10ML IV SOLN
INTRAVENOUS | Status: DC | PRN
  Administered 2016-07-14: 40 mg via INTRAVENOUS
  Administered 2016-07-14: 20 mg via INTRAVENOUS
  Administered 2016-07-14: 40 mg via INTRAVENOUS

## 2016-07-14 MED ORDER — LABETALOL HCL 5 MG/ML IV SOLN
20.0000 mg | Freq: Once | INTRAVENOUS | Status: AC
  Administered 2016-07-14 (×2): 20 mg via INTRAVENOUS

## 2016-07-14 MED ORDER — PROPOFOL 500 MG/50ML IV EMUL
INTRAVENOUS | Status: DC | PRN
  Administered 2016-07-14: 50 ug/kg/min via INTRAVENOUS

## 2016-07-14 MED ORDER — SODIUM CHLORIDE 0.9 % IV SOLN
INTRAVENOUS | Status: DC
  Administered 2016-07-14 – 2016-07-18 (×5): via INTRAVENOUS

## 2016-07-14 MED ORDER — LABETALOL HCL 5 MG/ML IV SOLN
INTRAVENOUS | Status: AC
  Filled 2016-07-14: qty 4

## 2016-07-14 MED ORDER — HYDRALAZINE HCL 20 MG/ML IJ SOLN
5.0000 mg | Freq: Once | INTRAMUSCULAR | Status: AC
  Administered 2016-07-14: 5 mg via INTRAVENOUS
  Filled 2016-07-14: qty 1

## 2016-07-14 MED ORDER — PROPOFOL 1000 MG/100ML IV EMUL
0.0000 ug/kg/min | INTRAVENOUS | Status: DC
  Administered 2016-07-14 – 2016-07-15 (×2): 20 ug/kg/min via INTRAVENOUS
  Filled 2016-07-14 (×3): qty 100

## 2016-07-14 NOTE — ED Notes (Signed)
Pt suctioned. Accessory muscle use noted. EDP notified. Pt remains alert.

## 2016-07-14 NOTE — Consult Note (Signed)
PULMONARY / CRITICAL CARE MEDICINE   Name: [name] MRN: [id number] DOB: 1931/05/18    ADMISSION DATE:  07/22/2016 CONSULTATION DATE:  07/30/2016  REFERRING MD:  Dr. Pearlean BrownieSethi  CHIEF COMPLAINT:  CVA  HISTORY OF PRESENT ILLNESS:  Patient is encephalopathic and/or intubated. Therefore history has been obtained from chart review.  82 year old female with PMH as below, which is significant for CHF, CKD IV, HTN, DVT, HTN, and PAF on eliquis. [facility] with stroke like symptoms (R sided facial droop and R weakness, aphasia, and drooling) and CT head demonstrated MI and L ICA thrombosis. She takes eliquis for PAF so she could not receive TPA. She was also profoundly hypertensive with SBP well into the 200s. [facility] was [facility], where she underwent revascularization by interventional radiology. Post IR procedure she is sent to ICU intubated for recovery. Upon arrival to ICU she remained hypertensive with SBP 250s.  PAST MEDICAL HISTORY :  She  has a past medical history of Acute respiratory failure (HCC); Aortic valve sclerosis; Bilateral iliac artery occlusion (HCC); Chronic diastolic CHF (congestive heart failure) (HCC); CKD (chronic kidney disease), stage IV (HCC); COPD (chronic obstructive pulmonary disease) (HCC); DVT (deep venous thrombosis) (HCC) (05/2008); Essential hypertension, benign; Hypertensive heart disease; Mixed hyperlipidemia; Normal cardiac stress test (2011); Orthostatic hypotension; Pericardial effusion; Persistent atrial fibrillation (HCC); PVD (peripheral vascular disease) (HCC); Renal artery stenosis (HCC); Stroke [facility](HCC) (03/2013); Syncope, near; and Tobacco abuse.  PAST SURGICAL HISTORY: She  has a past surgical history that includes Iliac artery stent (Left, 05/21/2008); Iliac artery stent (Right, 01/07/2009); Brain tumor excision (1999); Cataract extraction (Bilateral); Lens replacement (Left); doppler echocardiography (02/12/2010); NM MYOCAR PERF  WALL MOTION (02/12/2010); LEA DOPPLER (09/13/2012); and Renal duplex doppler (02/25/2012).  No Known Allergies  No current facility-administered medications on file prior to encounter.    Current Outpatient Prescriptions on File Prior to Encounter  Medication Sig  . albuterol (PROVENTIL HFA;VENTOLIN HFA) 108 (90 BASE) MCG/ACT inhaler Inhale 2 puffs into the lungs every 6 (six) hours as needed for wheezing.  Marland Kitchen. amLODipine (NORVASC) 2.5 MG tablet Take 2.5 mg by mouth daily.  Marland Kitchen. amLODipine (NORVASC) 5 MG tablet Take 5 mg by mouth daily.  Marland Kitchen. apixaban (ELIQUIS) 2.5 MG TABS tablet Take 2.5 mg by mouth 2 (two) times daily.  . Fluticasone Furoate-Vilanterol (BREO ELLIPTA) 100-25 MCG/INH AEPB Inhale 1 puff into the lungs daily.  . furosemide (LASIX) 40 MG tablet Take 1 tablet (40 mg total) by mouth daily.  Marland Kitchen. labetalol (NORMODYNE) 300 MG tablet Take 300 mg by mouth 2 (two) times daily.   Marland Kitchen. lisinopril (PRINIVIL,ZESTRIL) 40 MG tablet Take 1 tablet (40 mg total) by mouth daily.  . Multiple Vitamin (MULTI VITAMIN DAILY) TABS Take by mouth.  . potassium chloride SA (K-DUR,KLOR-CON) 20 MEQ tablet Take 2 tablets (40 mEq total) by mouth 2 (two) times daily.  . simvastatin (ZOCOR) 20 MG tablet Take 20 mg by mouth every evening.  . tiotropium (SPIRIVA) 18 MCG inhalation capsule Place 18 mcg into inhaler and inhale every morning.  . Vitamin D, Cholecalciferol, 1000 UNITS TABS Take 1 capsule by mouth daily.    FAMILY HISTORY:  Her indicated that her mother is deceased. She indicated that her father is deceased. She indicated that her sister is deceased. She indicated that her brother is deceased.    SOCIAL HISTORY: She  reports that she has been smoking Cigarettes.  She started smoking about 64 years ago. She has  a 17.50 pack-year smoking history. She has never used smokeless tobacco. She reports that she does not drink alcohol or use drugs.  REVIEW OF SYSTEMS:   unable  SUBJECTIVE:    VITAL SIGNS: BP  (!) 164/114   Pulse 75   Temp 97.3 F (36.3 C) (Rectal)   Resp (!) 31   Ht 5\' 7"  (1.702 m)   Wt 66.7 kg (147 lb) Comment: per last weight.  SpO2 100%   BMI 23.02 kg/m   HEMODYNAMICS:    VENTILATOR SETTINGS: Vent Mode: PRVC FiO2 (%):  [50 %] 50 % Set Rate:  [16 bmp] 16 bmp Vt Set:  [480 mL] 480 mL PEEP:  [5 cmH20] 5 cmH20 Plateau Pressure:  [14 cmH20] 14 cmH20  INTAKE / OUTPUT: No intake/output data recorded.  PHYSICAL EXAMINATION: General:  Elderly female in NAD Neuro:  Comatose with sedation off HEENT: Staves/AT, PERRL, no JVD Cardiovascular:  RRR, no MRG Lungs:  Clear Abdomen:  /AT, PERRL, no JVD Musculoskeletal:  No acute deformity or ROM limitation Skin:  Grossly intact  LABS:  BMET  Recent Labs Lab 08/05/2016 1351  NA 138  K 3.6  CL 107  CO2 27  BUN 59*  CREATININE 3.34*  GLUCOSE 168*    Electrolytes  Recent Labs Lab 07/16/2016 1351  CALCIUM 9.0    CBC  Recent Labs Lab 08/01/2016 1351  WBC 7.1  HGB 14.8  HCT 46.0  PLT 183    Coag's  Recent Labs Lab 08/07/2016 1351  APTT 29  INR 1.01    Sepsis Markers No results for input(s): LATICACIDVEN, PROCALCITON, O2SATVEN in the last 168 hours.  ABG No results for input(s): PHART, PCO2ART, PO2ART in the last 168 hours.  Liver Enzymes  Recent Labs Lab 08/05/2016 1351  AST 24  ALT 19  ALKPHOS 88  BILITOT 1.1  ALBUMIN 3.7    Cardiac Enzymes No results for input(s): TROPONINI, PROBNP in the last 168 hours.  Glucose  Recent Labs Lab 08/05/2016 1407  GLUCAP 148*    Imaging Ct Head Code Stroke W/o Cm  Result Date: 07/17/2016 CLINICAL DATA:  Code stroke. Code stroke. New right facial droop. History of meningioma resection 1999 EXAM: CT HEAD WITHOUT CONTRAST TECHNIQUE: Contiguous axial images were obtained from the base of the skull through the vertex without intravenous contrast. COMPARISON:  CT head 11/09/2013 FINDINGS: Bifrontal craniotomy. Encephalomalacia in the frontal lobes  bilaterally is stable and compatible with prior tumor resection. Chronic infarcts in the basal ganglia bilaterally unchanged. Negative for acute infarct, hemorrhage, or mass lesion. No acute skeletal abnormality. Increased density of the terminal left internal carotid artery which could be due to thrombosis. Possible extension into the left M1 segment. This was not present previously ASPECTS [facility] Stroke Program Early CT Score) - Ganglionic level infarction (caudate, lentiform nuclei, internal capsule, insula, M1-M3 cortex): 7 - Supraganglionic infarction (M4-M6 cortex): 3 Total score (0-10 with 10 being normal): 10 IMPRESSION: 1. Negative for acute infarct 2. ASPECTS is 10 3. Postop encephalomalacia in the frontal lobes bilaterally is stable. Chronic ischemia in the basal ganglia bilaterally is unchanged. 4. Hyperdense terminal left internal carotid artery suggestive of thrombosis. Consider CTA for further evaluation. These results were called by telephone at the time of interpretation on 07/26/2016 at 1:58 pm to Dr. Bethann Berkshire , who verbally acknowledged these results. Electronically Signed   By: Marlan Palau M.D.   On: 08/01/2016 13:59     STUDIES:  CT head 9/6 > Sulcal effacement in  the left frontal and parietal lobe with slight increased sub cortical edema is concerning for developing infarct. There is some loss of gray-white differentiation in the left frontal lobe both at the level of basal ganglia and in the super ganglionic region. Partial loss of gray-white differentiation suggesting developing infarct at the left insular cortex. No evidence for hemorrhage.  CULTURES:   ANTIBIOTICS:   SIGNIFICANT EVENTS: 9/6 admit CVA, IR, out on vent  LINES/TUBES: ETT 9/6 > R and L Fem Art sheath x2 9/6 >  DISCUSSION: 89F with complex medical history including CHF, PAF on eliquis, and HTN admitted for CVA and went emergently to IR for revascularization. Remains hypertensive with SBPs into 200s.  Will plan for BP management per Dr. Corliss Skains recommendations with clevidipine and PRNs. MRI in AM. Keep sedated on vent overnight.   ASSESSMENT / PLAN:  NEUROLOGIC A:   L MCA CVA s/p IR revascularization   P:   RASS goal: 0 to -1 Propofol infusion for sedation BP control Management per stroke service (primary) Prophylactic therapy per stroke Echo, carotid dopplers  PULMONARY A: Inability to protect airway in setting CVA, medical sedation COPD without exacerbation  P:   Full vent support CXR for ETT placement ABG reviewed VAP prevention bundle Duonebs, budesonide per home regimen > to nebs  CARDIOVASCULAR A:  Hypertensive Emergency Chronic diastolic CHF PAF  P:  SBP goals 120-140 per neurosurgery Trend troponin Holding home amlodipine, eliquis, lasix, labetalol, statin while NPO PRN labetalol, hydralazine Clevidipine infusion  RENAL A:   AKI CKD IV  P:   KVO fluids Follow BMP Correct electrolytes as indicated  GASTROINTESTINAL A:   No acute issues  P:   NPO Pepcid daily for SUP  HEMATOLOGIC A:   No acute issues  P:  Follow CBC  INFECTIOUS A:   No acute issues  P:   Follow WBC and fever curve  ENDOCRINE A:   No acute issues    P:   Follow BMP for glucose monitoring   FAMILY  - Updates:   - Inter-disciplinary family meet or Palliative Care meeting due by:  9/13  Joneen Roach, AGACNP-BC [place] [facility] Pager [phone] or [phone]  07/15/16 7:04 PM

## 2016-07-14 NOTE — ED Notes (Signed)
Positive gag reflex checked by EDP.

## 2016-07-14 NOTE — ED Notes (Signed)
Dentures removed. Pt suctioned due to gurgling. Pt remains alert and responsive at this time.

## 2016-07-14 NOTE — Progress Notes (Signed)
BEEPER 1335  ON TABLE 1343 EXAM COMPLETE,SENT TO SOC, CALLED RAD 1353

## 2016-07-14 NOTE — Anesthesia Preprocedure Evaluation (Addendum)
Anesthesia Evaluation  Patient identified by MRN, date of birth, ID band Patient confused    Reviewed: H&P Preop documentation limited or incomplete due to emergent nature of procedure.  History of Anesthesia Complications Negative for: history of anesthetic complications  Airway Mallampati: II  TM Distance: >3 FB Neck ROM: full    Dental no notable dental hx. (+) Edentulous Upper, Edentulous Lower   Pulmonary COPD, Current Smoker,    Pulmonary exam normal breath sounds clear to auscultation       Cardiovascular hypertension, + Peripheral Vascular Disease and +CHF  Normal cardiovascular exam+ dysrhythmias Atrial Fibrillation  Rhythm:regular Rate:Normal  EF 60%   Neuro/Psych CVA    GI/Hepatic negative GI ROS, Neg liver ROS,   Endo/Other  negative endocrine ROS  Renal/GU Renal disease     Musculoskeletal   Abdominal   Peds  Hematology negative hematology ROS (+)   Anesthesia Other Findings   Reproductive/Obstetrics negative OB ROS                            Anesthesia Physical Anesthesia Plan  ASA: III and emergent  Anesthesia Plan: General   Post-op Pain Management:    Induction: Intravenous  Airway Management Planned: Oral ETT  Additional Equipment: Arterial line  Intra-op Plan:   Post-operative Plan: Post-operative intubation/ventilation  Informed Consent: I have reviewed the patients History and Physical, chart, labs and discussed the procedure including the risks, benefits and alternatives for the proposed anesthesia with the patient or authorized representative who has indicated his/her understanding and acceptance.   Dental Advisory Given  Plan Discussed with: Anesthesiologist, CRNA and Surgeon  Anesthesia Plan Comments:         Anesthesia Quick Evaluation

## 2016-07-14 NOTE — Consult Note (Signed)
Chief Complaint: code stroke  History obtained from:  EMS and Chart  HPI:                                                                                                                                         [name] is an 82 y.o. female with hx as below presented to OSH with L MCA stroke like symptoms that started at 1300. 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 Transferred to [facility] for IR.   Date last known well: July 18, 2016 Time last known well: 1300 tPA Given: No: on Eliquis   Past Medical History:  Diagnosis Date  . Acute respiratory failure (HCC)   . Aortic valve sclerosis   . Bilateral iliac artery occlusion (HCC)   . Chronic diastolic CHF (congestive heart failure) (HCC)   . CKD (chronic kidney disease), stage IV (HCC)   . COPD (chronic obstructive pulmonary disease) (HCC)   . DVT (deep venous thrombosis) (HCC) 05/2008  . Essential hypertension, benign   . Hypertensive heart disease   . Mixed hyperlipidemia   . Normal cardiac stress test 2011  . Orthostatic hypotension   . Pericardial effusion    a. 2D Echo 03/2015: EF 65-70%, mod-severe concentric LVH, mild-mod MR, severely dilated LA, mildly reduced RV systolic function, mod dilated RA, small-moderate pericardial effusion (stable).  . Persistent atrial fibrillation (HCC)    History of GI bleed on Coumadin  . PVD (peripheral vascular disease) (HCC)    a. s/p bilateral iliac stenting (followed by Dr. Allyson Sabal).  . Renal artery stenosis (HCC)    a. 70% right renal artery stenosis during angiography performed in 2009. b. Last renal duplex 07/2014: 1-59% reduction in R renal artery, >60% diameter reduction in L renal artery, repeat recommended 6 months.  . Stroke (HCC) 03/2013   Reportedly mild  . Syncope, near   . Tobacco abuse     Past Surgical History:  Procedure Laterality Date  . BRAIN TUMOR EXCISION  1999   Hemangioma [facility])  . CATARACT EXTRACTION  Bilateral   . DOPPLER ECHOCARDIOGRAPHY  02/12/2010   EF =>55%  . ILIAC ARTERY STENT Left 05/21/2008   Dr. Allyson Sabal  . ILIAC ARTERY STENT Right 01/07/2009   Dr. Allyson Sabal  . LEA DOPPLER  09/13/2012   Right EIA stent >60%;rgt SFA occluded at prox. and mid level w/reconstitution at distal SFA/pop.;rgt PTA occlude;lft EIAstent >50%;lft mid SFA occluded;lft ATA & PTA occluded  . Lens replacement Left   . NM MYOCAR PERF WALL MOTION  02/12/2010   PERSANTINE: EF 57%;LV norm  . Renal duplex doppler  02/25/2012   Right 60-99%;lft renal artery no evidence siginificant reduction;rgt & lft kidneys norm size     Family History  Problem Relation Age of Onset  . Hypertension Mother   . Heart attack Father   .  Heart disease Sister   . Colon cancer Sister   . Heart attack Brother    Social History:  reports that she has been smoking Cigarettes.  She started smoking about 64 years ago. She has a 17.50 pack-year smoking history. She has never used smokeless tobacco. She reports that she does not drink alcohol or use drugs.  Allergies: No Known Allergies  Medications:                                                                                                                           I have reviewed the patient's current medications.  ROS:                                                                                                                                       History obtained from chart review  General ROS: negative for - chills, fatigue, fever, night sweats, weight gain or weight loss Psychological ROS: negative for - behavioral disorder, hallucinations, memory difficulties, mood swings or suicidal ideation Ophthalmic ROS: negative for - blurry vision, double vision, eye pain or loss of vision ENT ROS: negative for - epistaxis, nasal discharge, oral lesions, sore throat, tinnitus or vertigo Allergy and Immunology ROS: negative for - hives or itchy/watery eyes Hematological and Lymphatic  ROS: negative for - bleeding problems, bruising or swollen lymph nodes Endocrine ROS: negative for - galactorrhea, hair pattern changes, polydipsia/polyuria or temperature intolerance Respiratory ROS: negative for - cough, hemoptysis, shortness of breath or wheezing Cardiovascular ROS: negative for - chest pain, dyspnea on exertion, edema or irregular heartbeat Gastrointestinal ROS: negative for - abdominal pain, diarrhea, hematemesis, nausea/vomiting or stool incontinence Genito-Urinary ROS: negative for - dysuria, hematuria, incontinence or urinary frequency/urgency Musculoskeletal ROS: negative for - joint swelling or muscular weakness Neurological ROS: as noted in HPI Dermatological ROS: negative for rash and skin lesion changes  Neurologic Examination:  Blood pressure (!) 247/101, pulse 76, temperature 97.3 F (36.3 C), temperature source Rectal, resp. rate 25, height 5\' 7"  (1.702 m), weight 66.7 kg (147 lb), SpO2 92 %.  HEENT-  Normocephalic, no lesions, without obvious abnormality.  Normal external eye and conjunctiva.  Normal TM's bilaterally.  Normal auditory canals and external ears. Normal external nose, mucus membranes and septum.  Normal pharynx. Cardiovascular- irregularly irregular rhythm, pulses palpable throughout   Lungs- chest clear, no wheezing, rales, normal symmetric air entry, Heart exam - S1, S2 normal, no murmur, no gallop, rate regular Abdomen- soft, non-tender; bowel sounds normal; no masses,  no organomegaly   Neurological Examination NIHSS 31 R dense Hemiplegia  L gaze preference  Global aphasia R facial droop Pupils are equal and reactive Moves L side appropriately and 5/5 strength        Lab Results: Basic Metabolic Panel:  Recent Labs Lab 07/11/2016 1351  NA 138  K 3.6  CL 107  CO2 27  GLUCOSE 168*  BUN 59*  CREATININE 3.34*  CALCIUM 9.0     Liver Function Tests:  Recent Labs Lab 08/04/2016 1351  AST 24  ALT 19  ALKPHOS 88  BILITOT 1.1  PROT 7.4  ALBUMIN 3.7   No results for input(s): LIPASE, AMYLASE in the last 168 hours. No results for input(s): AMMONIA in the last 168 hours.  CBC:  Recent Labs Lab 07/31/2016 1351  WBC 7.1  NEUTROABS 5.2  HGB 14.8  HCT 46.0  MCV 88.1  PLT 183    Cardiac Enzymes: No results for input(s): CKTOTAL, CKMB, CKMBINDEX, TROPONINI in the last 168 hours.  Lipid Panel: No results for input(s): CHOL, TRIG, HDL, CHOLHDL, VLDL, LDLCALC in the last 168 hours.  CBG:  Recent Labs Lab 07/18/2016 1407  GLUCAP 148*    Microbiology: Results for orders placed or performed during the hospital encounter of 11/05/13  MRSA PCR Screening     Status: None   Collection Time: 11/05/13  2:54 PM  Result Value Ref Range Status   MRSA by PCR NEGATIVE NEGATIVE Final    Comment:        The GeneXpert MRSA Assay (FDA approved for NASAL specimens only), is one component of a comprehensive MRSA colonization surveillance program. It is not intended to diagnose MRSA infection nor to guide or monitor treatment for MRSA infections.    Coagulation Studies:  Recent Labs  07/27/2016 1351  LABPROT 13.3  INR 1.01    Imaging: Ct Head Code Stroke W/o Cm  Result Date: 07/18/2016 CLINICAL DATA:  Code stroke. Code stroke. New right facial droop. History of meningioma resection 1999 EXAM: CT HEAD WITHOUT CONTRAST TECHNIQUE: Contiguous axial images were obtained from the base of the skull through the vertex without intravenous contrast. COMPARISON:  CT head 11/09/2013 FINDINGS: Bifrontal craniotomy. Encephalomalacia in the frontal lobes bilaterally is stable and compatible with prior tumor resection. Chronic infarcts in the basal ganglia bilaterally unchanged. Negative for acute infarct, hemorrhage, or mass lesion. No acute skeletal abnormality. Increased density of the terminal left internal carotid  artery which could be due to thrombosis. Possible extension into the left M1 segment. This was not present previously ASPECTS [facility]([place] Stroke Program Early CT Score) - Ganglionic level infarction (caudate, lentiform nuclei, internal capsule, insula, M1-M3 cortex): 7 - Supraganglionic infarction (M4-M6 cortex): 3 Total score (0-10 with 10 being normal): 10 IMPRESSION: 1. Negative for acute infarct 2. ASPECTS is 10 3. Postop encephalomalacia in the frontal lobes bilaterally is stable. Chronic ischemia  in the basal ganglia bilaterally is unchanged. 4. Hyperdense terminal left internal carotid artery suggestive of thrombosis. Consider CTA for further evaluation. These results were called by telephone at the time of interpretation on 07/31/2016 at 1:58 pm to Dr. Bethann Berkshire , who verbally acknowledged these results. Electronically Signed   By: Marlan Palau M.D.   On: 08/06/2016 13:59        Assessment: 82 y.o. female with hx as below presented to OSH with L MCA stroke like symptoms that started at 1300. 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 Transferred to [facility] for IR.   Stroke Risk Factors - atrial fibrillation   - Will go to IR  1. HgbA1c, fasting lipid panel 2. MRI, MRA  of the brain without contrast 3. PT consult, OT consult, Speech consult 4. Echocardiogram 5. Carotid dopplers 6. Prophylactic therapy-Antiplatelet med: Aspirin - dose 325mg , depending on size of stroke on MRI after IR may have to wait to be placed back on Eliquis, if large stroke would wait 10-14 days 7. Risk factor modification 8. Telemetry monitoring 9. Frequent neuro checks 10 NPO until passes stroke swallow screen 11 please page stroke NP  Or  PA  Or MD from 8am -4 pm  as this patient from this time will be  followed by the stroke.   You can look them up on [email or web address]  Password [email or web address]

## 2016-07-14 NOTE — Procedures (Signed)
S/p Lt common carotid arteriogram,followed by complete revascularization of LT ICA T occlusion with x 1 pass with Solitaire 40 mm FR retrieval device and 3.6 mg of superselective IA INTEGRELIN achieving a TICI 3 reperfusion.

## 2016-07-14 NOTE — ED Notes (Signed)
Pt arrived back in room from CT. Difficulty obtaining vital signs due to pt's condition.

## 2016-07-14 NOTE — Transfer of Care (Signed)
Immediate Anesthesia Transfer of Care Note  Patient: [name]  Procedure(s) Performed: Procedure(s): RADIOLOGY WITH ANESTHESIA (N/A)  Patient Location: ICU  Anesthesia Type:General  Level of Consciousness: Patient remains intubated per anesthesia plan  Airway & Oxygen Therapy: Patient remains intubated per anesthesia plan and Patient placed on Ventilator (see vital sign flow sheet for setting)  Post-op Assessment: Report given to RN  Post vital signs: Reviewed  Last Vitals:  Vitals:   07/30/2016 1540 08/03/2016 1819  BP:  (!) 164/114  Pulse:  75  Resp: (!) 31   Temp:      Last Pain:  Vitals:   08/06/2016 1434  TempSrc: Rectal         Complications: No apparent anesthesia complications

## 2016-07-14 NOTE — ED Provider Notes (Signed)
3:43 PM  Angiocath insertion Performed by: Lyanne CoAMPOS,Lucynda Rosano M Consent: Verbal consent obtained. Risks and benefits: risks, benefits and alternatives were discussed Time out: Immediately prior to procedure a "time out" was called to verify the correct patient, procedure, equipment, support staff and site/side marked as required. Preparation: Patient was prepped and draped in the usual sterile fashion. Vein Location: left external jugular Gauge: 18 Normal blood return and flush without difficulty Patient tolerance: Patient tolerated the procedure well with no immediate complications.  Arrived from [facility] after major acute ischemic stroke.  Unable to obtain a CTA secondary to renal insufficiency.  Patient be taken to interventional radiology emergently for attempts at revascularization.  She is flaccid in the right upper extremity.  She has right-sided hemi-neglect.  She did appear to have some movement in her right lower extremity.  Her stroke scale was 30-32.  Hypertensive on arrival with a blood pressure of 254/128.  20 mg IV labetalol given.  At this time she is able to follow simple commands.  She may require intubation for procedure itself but anesthesia will be present.  At this time she appears to be protecting her airway.        Azalia BilisKevin Sherin Murdoch, MD 2015-11-30 [phone]

## 2016-07-14 NOTE — ED Notes (Signed)
[facility] Belongings sent with EMS.

## 2016-07-14 NOTE — Anesthesia Procedure Notes (Signed)
Procedure Name: Intubation Date/Time: 07/17/2016 3:52 PM Performed by: Jefm MilesENNIE, Anaclara Acklin E Pre-anesthesia Checklist: Patient identified, Emergency Drugs available, Suction available and Patient being monitored Patient Re-evaluated:Patient Re-evaluated prior to inductionOxygen Delivery Method: Circle System Utilized Preoxygenation: Pre-oxygenation with 100% oxygen Intubation Type: IV induction and Rapid sequence Ventilation: Mask ventilation without difficulty Laryngoscope Size: Mac and 3 Grade View: Grade I Tube type: Subglottic suction tube Tube size: 7.5 mm Number of attempts: 1 Airway Equipment and Method: Stylet and Oral airway Placement Confirmation: ETT inserted through vocal cords under direct vision,  positive ETCO2 and breath sounds checked- equal and bilateral Secured at: 21 cm Tube secured with: Tape Dental Injury: Teeth and Oropharynx as per pre-operative assessment

## 2016-07-14 NOTE — Code Documentation (Signed)
82yo [facility] at [phone].  Patient presented to [facility] with stroke symptoms and [facility]  Patient was contraindicated for tPA at [facility] d/t taking Eliquis.  CT showing hyperdense terminal left internal carotid artery suggestive of thrombosis, ASPECTS 10.  NIHSS 31 at [facility] and patient transferred for possible endovascular intervention.  Stroke team to the [facility].  Patient to D35.  CTA deferred d/t elevated Creatinine.  NIHSS 32, see documentation for details and code stroke times.  Patient nonverbal with left gaze and right hemiplegia on exam.  EJ access and foley catheter placed.  Patient hypertensive, see flowsheet, and Labetalol 20mg  IVP ordered and given. IR to the bedside and patient transported to IR with team.  Family notified and reports that patient lives alone and completes her own ADLs and still drives.  Handoff with IR team.

## 2016-07-14 NOTE — ED Notes (Signed)
EMS arrived and is at bedside.

## 2016-07-14 NOTE — ED Notes (Signed)
Pt has urinated and had a BM. Pt changed into gown. Clothing cut off pt and thrown away with permission from pt's brother. Pt's jewelry and dentures removed and given to family. Rest of clothing placed in belongings bag.

## 2016-07-14 NOTE — Progress Notes (Signed)
Anesthesia present for case 

## 2016-07-14 NOTE — ED Notes (Signed)
Assumed care of patient and obtained report from EMS.  Stroke team, Rapid Response RN and Dr. Patria Maneampos at bedside.

## 2016-07-14 NOTE — ED Notes (Signed)
[facility] here to transport Pt to [facility]. 

## 2016-07-14 NOTE — ED Notes (Signed)
Multiple attempts for second IV site with no success. Pt has [facility] in [facility].

## 2016-07-14 NOTE — Progress Notes (Signed)
Report given to RN over phone and at bedside. No questions at this time from RN

## 2016-07-14 NOTE — ED Provider Notes (Signed)
[facility] DEPT Provider Note   CSN: [id number] Arrival date & time: Jul 27, 2016  1328   An emergency department physician performed an initial assessment on this suspected stroke patient at 1329.  History   Chief Complaint Chief Complaint  Patient presents with  . Code Stroke    HPI [name] is a 82 y.o. female.  Patient was with her husband at 1:00 started having slurred speech and weakness on the right side and he drove her directly to the emergency department    Cerebrovascular Accident  This is a new problem. The current episode started less than 1 hour ago. The problem occurs constantly. The problem has not changed since onset.Pertinent negatives include no chest pain. Nothing aggravates the symptoms. Nothing relieves the symptoms. She has tried nothing for the symptoms. The treatment provided no relief.    Past Medical History:  Diagnosis Date  . Acute respiratory failure (HCC)   . Aortic valve sclerosis   . Bilateral iliac artery occlusion (HCC)   . Chronic diastolic CHF (congestive heart failure) (HCC)   . CKD (chronic kidney disease), stage IV (HCC)   . COPD (chronic obstructive pulmonary disease) (HCC)   . DVT (deep venous thrombosis) (HCC) 05/2008  . Essential hypertension, benign   . Hypertensive heart disease   . Mixed hyperlipidemia   . Normal cardiac stress test 2011  . Orthostatic hypotension   . Pericardial effusion    a. 2D Echo 03/2015: EF 65-70%, mod-severe concentric LVH, mild-mod MR, severely dilated LA, mildly reduced RV systolic function, mod dilated RA, small-moderate pericardial effusion (stable).  . Persistent atrial fibrillation (HCC)    History of GI bleed on Coumadin  . PVD (peripheral vascular disease) (HCC)    a. s/p bilateral iliac stenting (followed by Dr. Allyson Sabal).  . Renal artery stenosis (HCC)    a. 70% right renal artery stenosis during angiography performed in 2009. b. Last renal duplex 07/2014: 1-59% reduction in R renal  artery, >60% diameter reduction in L renal artery, repeat recommended 6 months.  . Stroke (HCC) 03/2013   Reportedly mild  . Syncope, near   . Tobacco abuse     Patient Active Problem List   Diagnosis Date Noted  . CKD (chronic kidney disease), stage IV (HCC) 12/26/2015  . Essential hypertension 12/26/2015  . PVD (peripheral vascular disease) (HCC) 12/26/2015  . Dyspnea 04/08/2015  . Tobacco abuse   . Renal artery stenosis (HCC)   . Malignant hypertensive heart disease 11/10/2013  . Pericardial effusion 11/07/2013  . Acute on chronic diastolic heart failure (HCC) 11/05/2013  . Peripheral vascular disease with claudication (HCC) 04/05/2013  . COPD (chronic obstructive pulmonary disease) (HCC) 02/27/2013  . Warfarin-induced coagulopathy (HCC) 02/27/2013  . Epistaxis 02/27/2013  . Long term (current) use of anticoagulants 01/14/2013  . Permanent atrial fibrillation (HCC) 01/11/2013  . CVA (cerebral infarction) 01/11/2013  . Dyslipidemia 01/11/2013  . Aortic sclerosis (HCC) 01/11/2013    Past Surgical History:  Procedure Laterality Date  . BRAIN TUMOR EXCISION  1999   Hemangioma [facility])  . CATARACT EXTRACTION Bilateral   . DOPPLER ECHOCARDIOGRAPHY  02/12/2010   EF =>55%  . ILIAC ARTERY STENT Left 05/21/2008   Dr. Allyson Sabal  . ILIAC ARTERY STENT Right 01/07/2009   Dr. Allyson Sabal  . LEA DOPPLER  09/13/2012   Right EIA stent >60%;rgt SFA occluded at prox. and mid level w/reconstitution at distal SFA/pop.;rgt PTA occlude;lft EIAstent >50%;lft mid SFA occluded;lft ATA & PTA occluded  . Lens replacement Left   .  NM MYOCAR PERF WALL MOTION  02/12/2010   PERSANTINE: EF 57%;LV norm  . Renal duplex doppler  02/25/2012   Right 60-99%;lft renal artery no evidence siginificant reduction;rgt & lft kidneys norm size     OB History    No data available       Home Medications    Prior to Admission medications   Medication Sig Start Date End Date Taking? Authorizing Provider  albuterol  (PROVENTIL HFA;VENTOLIN HFA) 108 (90 BASE) MCG/ACT inhaler Inhale 2 puffs into the lungs every 6 (six) hours as needed for wheezing. 01/15/13   Avon Gullyesfaye Fanta, MD  amLODipine (NORVASC) 2.5 MG tablet Take 2.5 mg by mouth daily.    Historical Provider, MD  amLODipine (NORVASC) 5 MG tablet Take 5 mg by mouth daily.    Historical Provider, MD  apixaban (ELIQUIS) 2.5 MG TABS tablet Take 2.5 mg by mouth 2 (two) times daily.    Historical Provider, MD  Fluticasone Furoate-Vilanterol (BREO ELLIPTA) 100-25 MCG/INH AEPB Inhale 1 puff into the lungs daily.    Historical Provider, MD  furosemide (LASIX) 40 MG tablet Take 1 tablet (40 mg total) by mouth daily. 04/11/15   Avon Gullyesfaye Fanta, MD  labetalol (NORMODYNE) 300 MG tablet Take 300 mg by mouth 2 (two) times daily.  11/13/13   Avon Gullyesfaye Fanta, MD  lisinopril (PRINIVIL,ZESTRIL) 40 MG tablet Take 1 tablet (40 mg total) by mouth daily. 11/13/13   Avon Gullyesfaye Fanta, MD  Multiple Vitamin (MULTI VITAMIN DAILY) TABS Take by mouth.    Historical Provider, MD  potassium chloride SA (K-DUR,KLOR-CON) 20 MEQ tablet Take 2 tablets (40 mEq total) by mouth 2 (two) times daily. 11/13/13   Avon Gullyesfaye Fanta, MD  simvastatin (ZOCOR) 20 MG tablet Take 20 mg by mouth every evening.    Historical Provider, MD  tiotropium (SPIRIVA) 18 MCG inhalation capsule Place 18 mcg into inhaler and inhale every morning. 01/15/13   Avon Gullyesfaye Fanta, MD  Vitamin D, Cholecalciferol, 1000 UNITS TABS Take 1 capsule by mouth daily.    Historical Provider, MD    Family History Family History  Problem Relation Age of Onset  . Hypertension Mother   . Heart attack Father   . Heart disease Sister   . Colon cancer Sister   . Heart attack Brother     Social History Social History  Substance Use Topics  . Smoking status: Current Some Day Smoker    Packs/day: 0.50    Years: 35.00    Types: Cigarettes    Start date: 04/16/1952  . Smokeless tobacco: Never Used  . Alcohol use No     Allergies   Review of patient's  allergies indicates no known allergies.   Review of Systems Review of Systems  Unable to perform ROS: Mental status change  Cardiovascular: Negative for chest pain.     Physical Exam Updated Vital Signs BP (!) 228/114 (BP Location: Left Arm)   Pulse 79   Temp 97.3 F (36.3 C) (Rectal)   Resp 24   Ht 5\' 7"  (1.702 m)   Wt 147 lb (66.7 kg) Comment: per last weight.  SpO2 94%   BMI 23.02 kg/m   Physical Exam  Constitutional: She appears well-developed.  HENT:  Head: Normocephalic.  Patient's gag reflex is present  Eyes: Conjunctivae and EOM are normal. No scleral icterus.  Neck: Neck supple. No thyromegaly present.  Cardiovascular: Normal rate and regular rhythm.  Exam reveals no gallop and no friction rub.   No murmur heard. Pulmonary/Chest: No stridor. She  has no wheezes. She has no rales. She exhibits no tenderness.  Abdominal: She exhibits no distension. There is no tenderness. There is no rebound.  Musculoskeletal: She exhibits no edema.  Lymphadenopathy:    She has no cervical adenopathy.  Neurological: She exhibits normal muscle tone. Coordination normal.  Pt awake but will not follow any commands. She is gazing to her left. Patient has a flaccid right arm and right leg.  Patient not speaking  Skin: No rash noted. No erythema.  Psychiatric:  Unable to assess     ED Treatments / Results  Labs (all labs ordered are listed, but only abnormal results are displayed) Labs Reviewed  CBC - Abnormal; Notable for the following:       Result Value   RBC 5.22 (*)    All other components within normal limits  COMPREHENSIVE METABOLIC PANEL - Abnormal; Notable for the following:    Glucose, Bld 168 (*)    BUN 59 (*)    Creatinine, Ser 3.34 (*)    GFR calc non Af Amer 12 (*)    GFR calc Af Amer 14 (*)    Anion gap 4 (*)    All other components within normal limits  CBG MONITORING, ED - Abnormal; Notable for the following:    Glucose-Capillary 148 (*)    All other  components within normal limits  ETHANOL  PROTIME-INR  APTT  DIFFERENTIAL  URINE RAPID DRUG SCREEN, HOSP PERFORMED  URINALYSIS, ROUTINE W REFLEX MICROSCOPIC (NOT AT [facility])  I-STAT CHEM 8, ED  I-STAT TROPOININ, ED    EKG  EKG Interpretation None       Radiology Ct Head Code Stroke W/o Cm  Result Date: 07/29/2016 CLINICAL DATA:  Code stroke. Code stroke. New right facial droop. History of meningioma resection 1999 EXAM: CT HEAD WITHOUT CONTRAST TECHNIQUE: Contiguous axial images were obtained from the base of the skull through the vertex without intravenous contrast. COMPARISON:  CT head 11/09/2013 FINDINGS: Bifrontal craniotomy. Encephalomalacia in the frontal lobes bilaterally is stable and compatible with prior tumor resection. Chronic infarcts in the basal ganglia bilaterally unchanged. Negative for acute infarct, hemorrhage, or mass lesion. No acute skeletal abnormality. Increased density of the terminal left internal carotid artery which could be due to thrombosis. Possible extension into the left M1 segment. This was not present previously ASPECTS [facility] Stroke Program Early CT Score) - Ganglionic level infarction (caudate, lentiform nuclei, internal capsule, insula, M1-M3 cortex): 7 - Supraganglionic infarction (M4-M6 cortex): 3 Total score (0-10 with 10 being normal): 10 IMPRESSION: 1. Negative for acute infarct 2. ASPECTS is 10 3. Postop encephalomalacia in the frontal lobes bilaterally is stable. Chronic ischemia in the basal ganglia bilaterally is unchanged. 4. Hyperdense terminal left internal carotid artery suggestive of thrombosis. Consider CTA for further evaluation. These results were called by telephone at the time of interpretation on 08/06/2016 at 1:58 pm to Dr. Bethann Berkshire , who verbally acknowledged these results. Electronically Signed   By: Marlan Palau M.D.   On: 07/20/2016 13:59    Procedures Procedures (including critical care time)  Medications Ordered in  ED Medications  labetalol (NORMODYNE,TRANDATE) 5 MG/ML injection (not administered)  hydrALAZINE (APRESOLINE) injection 5 mg (5 mg Intravenous Given 07/09/2016 1415)     Initial Impression / Assessment and Plan / ED Course  I have reviewed the triage vital signs and the nursing notes.  Pertinent labs & imaging results that were available during my care of the patient were reviewed by me and  considered in my medical decision making (see chart for details).  Clinical Course    CRITICAL CARE Performed by: Bingham Millette L Total critical care time:60 minutes Critical care time was exclusive of separately billable procedures and treating other patients. Critical care was necessary to treat or prevent imminent or life-threatening deterioration. Critical care was time spent personally by me on the following activities: development of treatment plan with patient and/or surrogate as well as nursing, discussions with consultants, evaluation of patient's response to treatment, examination of patient, obtaining history from patient or surrogate, ordering and performing treatments and interventions, ordering and review of laboratory studies, ordering and review of radiographic studies, pulse oximetry and re-evaluation of patient's condition.   Final Clinical Impressions(s) / ED Diagnoses   Final diagnoses:  Acute ischemic left MCA stroke (HCC)  Patient's CT did not show an acute bleed. I spoke with neurology and radiology and it was decided to transport the patient to [facility] for possible interventional radiology procedure. We were unable to do a CT angios because of her creatinine. The telemetry neurologist stated to keep her blood pressure under 220/120. The patient was given a Apresoline and labetalol to lower her blood pressure. She was transported directly to the emergency department at [facility].   Dr.  Cherylynn Ridges neurology to see pt  New Prescriptions New Prescriptions   No medications  on file     Bethann Berkshire, MD 07/18/2016 1458

## 2016-07-14 NOTE — ED Notes (Signed)
14 French Foley inserted American International GroupJessica RN, 20mg  IV Labetolol given, 18g Left IJ placed by Dr. Patria Maneampos. Pt transported to IR now.

## 2016-07-14 NOTE — ED Triage Notes (Signed)
Pt brought in by husband with new onset right sided facial droop, right sided weakness, inability to speak, and  drooling. Husband states this started at [id number] today when they were on the way to a funeral.

## 2016-07-15 ENCOUNTER — Encounter (HOSPITAL_COMMUNITY): Payer: Self-pay | Admitting: Interventional Radiology

## 2016-07-15 ENCOUNTER — Inpatient Hospital Stay (HOSPITAL_COMMUNITY): Payer: Medicare Other

## 2016-07-15 DIAGNOSIS — G936 Cerebral edema: Secondary | ICD-10-CM

## 2016-07-15 DIAGNOSIS — Q251 Coarctation of aorta: Secondary | ICD-10-CM

## 2016-07-15 DIAGNOSIS — M6289 Other specified disorders of muscle: Secondary | ICD-10-CM

## 2016-07-15 DIAGNOSIS — I63032 Cerebral infarction due to thrombosis of left carotid artery: Secondary | ICD-10-CM

## 2016-07-15 DIAGNOSIS — G935 Compression of brain: Secondary | ICD-10-CM

## 2016-07-15 DIAGNOSIS — I63512 Cerebral infarction due to unspecified occlusion or stenosis of left middle cerebral artery: Secondary | ICD-10-CM

## 2016-07-15 DIAGNOSIS — R531 Weakness: Secondary | ICD-10-CM

## 2016-07-15 LAB — CBC WITH DIFFERENTIAL/PLATELET
BASOS PCT: 0 %
Basophils Absolute: 0 10*3/uL (ref 0.0–0.1)
Eosinophils Absolute: 0 10*3/uL (ref 0.0–0.7)
Eosinophils Relative: 0 %
HEMATOCRIT: 37.8 % (ref 36.0–46.0)
HEMOGLOBIN: 11.7 g/dL — AB (ref 12.0–15.0)
LYMPHS ABS: 0.7 10*3/uL (ref 0.7–4.0)
Lymphocytes Relative: 9 %
MCH: 27.5 pg (ref 26.0–34.0)
MCHC: 31 g/dL (ref 30.0–36.0)
MCV: 88.7 fL (ref 78.0–100.0)
MONOS PCT: 9 %
Monocytes Absolute: 0.7 10*3/uL (ref 0.1–1.0)
NEUTROS ABS: 6.5 10*3/uL (ref 1.7–7.7)
NEUTROS PCT: 82 %
Platelets: 162 10*3/uL (ref 150–400)
RBC: 4.26 MIL/uL (ref 3.87–5.11)
RDW: 14.7 % (ref 11.5–15.5)
WBC: 7.9 10*3/uL (ref 4.0–10.5)

## 2016-07-15 LAB — BASIC METABOLIC PANEL
Anion gap: 10 (ref 5–15)
BUN: 55 mg/dL — ABNORMAL HIGH (ref 6–20)
CHLORIDE: 111 mmol/L (ref 101–111)
CO2: 22 mmol/L (ref 22–32)
CREATININE: 3.48 mg/dL — AB (ref 0.44–1.00)
Calcium: 8.7 mg/dL — ABNORMAL LOW (ref 8.9–10.3)
GFR calc non Af Amer: 11 mL/min — ABNORMAL LOW (ref >60)
GFR, EST AFRICAN AMERICAN: 13 mL/min — AB (ref >60)
Glucose, Bld: 118 mg/dL — ABNORMAL HIGH (ref 65–99)
POTASSIUM: 4.2 mmol/L (ref 3.5–5.1)
SODIUM: 143 mmol/L (ref 135–145)

## 2016-07-15 LAB — BLOOD GAS, ARTERIAL
ACID-BASE DEFICIT: 3.5 mmol/L — AB (ref 0.0–2.0)
BICARBONATE: 20.3 mmol/L (ref 20.0–28.0)
FIO2: 50
MECHVT: 480 mL
O2 SAT: 99.2 %
PATIENT TEMPERATURE: 98.6
PCO2 ART: 32 mmHg (ref 32.0–48.0)
PEEP/CPAP: 5 cmH2O
PH ART: 7.418 (ref 7.350–7.450)
PO2 ART: 154 mmHg — AB (ref 83.0–108.0)
RATE: 20 resp/min

## 2016-07-15 LAB — MAGNESIUM
MAGNESIUM: 2.1 mg/dL (ref 1.7–2.4)
MAGNESIUM: 2 mg/dL (ref 1.7–2.4)

## 2016-07-15 LAB — GLUCOSE, CAPILLARY: Glucose-Capillary: 185 mg/dL — ABNORMAL HIGH (ref 65–99)

## 2016-07-15 LAB — PHOSPHORUS
Phosphorus: 4.9 mg/dL — ABNORMAL HIGH (ref 2.5–4.6)
Phosphorus: 5.2 mg/dL — ABNORMAL HIGH (ref 2.5–4.6)

## 2016-07-15 LAB — TRIGLYCERIDES: Triglycerides: 106 mg/dL (ref <150)

## 2016-07-15 MED ORDER — ASPIRIN 325 MG PO TABS
325.0000 mg | ORAL_TABLET | Freq: Every day | ORAL | Status: DC
  Filled 2016-07-15: qty 1

## 2016-07-15 MED ORDER — VITAL AF 1.2 CAL PO LIQD
1000.0000 mL | ORAL | Status: DC
  Administered 2016-07-15: 2000 mL
  Administered 2016-07-16: 1000 mL

## 2016-07-15 MED ORDER — SODIUM CHLORIDE 0.9 % IV BOLUS (SEPSIS)
250.0000 mL | Freq: Once | INTRAVENOUS | Status: AC
  Administered 2016-07-15: 250 mL via INTRAVENOUS

## 2016-07-15 MED ORDER — HEPARIN SODIUM (PORCINE) 5000 UNIT/ML IJ SOLN
5000.0000 [IU] | Freq: Three times a day (TID) | INTRAMUSCULAR | Status: DC
  Administered 2016-07-15 – 2016-07-18 (×10): 5000 [IU] via SUBCUTANEOUS
  Filled 2016-07-15 (×11): qty 1

## 2016-07-15 MED ORDER — ASPIRIN 300 MG RE SUPP: 300.0000 mg | Freq: Every day | RECTAL | Status: DC

## 2016-07-15 MED ORDER — PRO-STAT SUGAR FREE PO LIQD
30.0000 mL | Freq: Two times a day (BID) | ORAL | Status: DC
  Administered 2016-07-15: 30 mL
  Filled 2016-07-15: qty 30

## 2016-07-15 MED ORDER — ASPIRIN 325 MG PO TABS
325.0000 mg | ORAL_TABLET | Freq: Every day | ORAL | Status: DC
  Administered 2016-07-15 – 2016-07-16 (×2): 325 mg
  Filled 2016-07-15 (×2): qty 1

## 2016-07-15 MED ORDER — VITAL HIGH PROTEIN PO LIQD: 1000.0000 mL | ORAL | Status: DC

## 2016-07-15 NOTE — Anesthesia Postprocedure Evaluation (Signed)
Anesthesia Post Note  Patient: [name]  Procedure(s) Performed: Procedure(s) (LRB): RADIOLOGY WITH ANESTHESIA (N/A)  Patient location during evaluation: SICU Anesthesia Type: General Level of consciousness: sedated Pain management: pain level controlled Vital Signs Assessment: post-procedure vital signs reviewed and stable Respiratory status: patient remains intubated per anesthesia plan Cardiovascular status: stable Anesthetic complications: no    Last Vitals:  Vitals:   07/15/16 0500 07/15/16 0600  BP: 130/69 140/76  Pulse:  67  Resp: 20 16  Temp:      Last Pain:  Vitals:   07/15/16 0341  TempSrc: Oral                 Reino KentJudd, Yvan Dority J

## 2016-07-15 NOTE — Progress Notes (Signed)
STROKE TEAM PROGRESS NOTE   HISTORY OF PRESENT ILLNESS (per record) [name] is an 82 y.o. female with hx as below presented to OSH with L MCA stroke like symptoms that started at 1300. 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 Transferred to [facility] for IR.   Date last known well: 07/20/2016 Time last known well: 1300 tPA Given: No: on Eliquis    CEREBRAL ANGIOGRAM [ZOX0960 (Custom)] S/p Lt common carotid arteriogram,followed by complete revascularization of LT ICA T occlusion with x 1 pass with Solitaire 40 mm FR retrieval device and 3.6 mg of superselective IA INTEGRELIN achieving a TICI 3 reperfusion.     SUBJECTIVE (INTERVAL HISTORY) The [name]'s 2 daughters were at the bedside. The [name] is intubated and sedated. Dr. Pearlean Brownie discussed the [name]'s case with the family. The family will discuss code status, as well as prolonged life support versus comfort care based on the [name]'s progress, or lack thereof, over the next several days. The plan at this time is to continue full support and repeat head CT in the morning.   OBJECTIVE Temp:  [95.9 F (35.5 C)-99.5 F (37.5 C)] 98.4 F (36.9 C) (09/07 0728) Pulse Rate:  [67-99] 67 (09/07 0600) Cardiac Rhythm: Normal sinus rhythm (09/07 0400) Resp:  [16-31] 16 (09/07 0600) BP: (98-267)/(55-168) 140/76 (09/07 0600) SpO2:  [91 %-100 %] 100 % (09/07 0600) Arterial Line BP: (109-274)/(55-136) 151/66 (09/07 0600) FiO2 (%):  [50 %] 50 % (09/07 0400) Weight:  [63.7 kg (140 lb 6.9 oz)-66.7 kg (147 lb)] 63.7 kg (140 lb 6.9 oz) (09/07 0500)  CBC:  Recent Labs Lab 07/20/2016 1351 07/15/16 0445  WBC 7.1 7.9  NEUTROABS 5.2 6.5  HGB 14.8 11.7*  HCT 46.0 37.8  MCV 88.1 88.7  PLT 183 162    Basic Metabolic Panel:  Recent Labs Lab 2016-07-20 1351 07/15/16 0445  NA 138 143  K 3.6 4.2  CL 107 111  CO2 27 22  GLUCOSE 168* 118*  BUN 59* 55*  CREATININE 3.34* 3.48*   CALCIUM 9.0 8.7*    Lipid Panel:    Component Value Date/Time   CHOL  05/25/2008 0731    76        ATP III CLASSIFICATION:  <200     mg/dL   Desirable  454-098  mg/dL   Borderline High  >=119    mg/dL   High   TRIG 147 82/95/6213 0445   HDL 37 (L) 05/25/2008 0731   CHOLHDL 2.1 05/25/2008 0731   VLDL 11 05/25/2008 0731   LDLCALC  05/25/2008 0731    28        Total Cholesterol/HDL:CHD Risk Coronary Heart Disease Risk Table                     Men   Women  1/2 Average Risk   3.4   3.3   HgbA1c:  Lab Results  Component Value Date   HGBA1C 5.8 (H) 11/04/2015   Urine Drug Screen:    Component Value Date/Time   LABOPIA NONE DETECTED 20-Jul-2016 2033   COCAINSCRNUR NONE DETECTED 07-20-16 2033   LABBENZ NONE DETECTED July 20, 2016 2033   AMPHETMU NONE DETECTED 2016/07/20 2033   THCU NONE DETECTED Jul 20, 2016 2033   LABBARB NONE DETECTED July 20, 2016 2033      IMAGING  Ct Head Wo Contrast 2016-07-20 1. Sulcal effacement in the left frontal and parietal lobe with slight increased  sub cortical edema is concerning for developing infarct. There is some loss of gray-white differentiation in the left frontal lobe both at the level of basal ganglia and in the super ganglionic region.  2. Partial loss of gray-white differentiation suggesting developing infarct at the left insular cortex.  3. No evidence for hemorrhage.  4. Chronic bifrontal encephalomalacia.    Mr Brain Wo Contrast 07/15/2016 1. Large acute infarct within the left MCA distribution. No acute hemorrhage.  2. Mild mass effect with sulcal effacement and approximately 3 mm rightward midline shift.     Portable Chest Xray 07/16/2016 Enlargement of cardiac silhouette with pulmonary vascular congestion. Tip of endotracheal tube projects 3.7 cm above carina.    Ct Head Code Stroke W/o Cm 07/16/2016 1. Negative for acute infarct  2. ASPECTS is 10  3. Postop encephalomalacia in the frontal lobes bilaterally is stable. Chronic  ischemia in the basal ganglia bilaterally is unchanged.  4. Hyperdense terminal left internal carotid artery suggestive of thrombosis.   Consider CTA for further evaluation.      CEREBRAL ANGIOGRAM [[id number] (Custom)] S/p Lt common carotid arteriogram,followed by complete revascularization of LT ICA T occlusion with x 1 pass with Solitaire 4mmx 40 mm FR retrieval device and 3.6 mg of superselective IA INTEGRELIN achieving a TICI 3 reperfusion.   PHYSICAL EXAM Elderly lady who is intubated. On mechanical ventilation. . Afebrile. Head is nontraumatic. Neck is supple without bruit.    Cardiac exam no murmur or gallop. Lungs are clear to auscultation. Distal pulses are well felt. Neurological Exam :  [name] is sedated. Eyes are closed. Opens eyes partially to sternal rub. Globally aphasic and will not follow any commands.  Left gaze deviation. Will not look to the right. Pupils irregular but reactive. Fundi were not visualized. Right lower facial weakness. Tongue midline. [name] had some spontaneous left upper and lower extremity movement. She'll move left sided extremities purposefully to sternal rub. No right upper extremity movements even to pain. Mild right lower extremity withdrawal to painful stimuli. Deep tendon reflexes normal on the left and depressed on the right. Right plantar upgoing left downgoing. Withdraws to painful stimuli more on the left than the right ASSESSMENT/PLAN [name] is a 82 y.o. female with history of tobacco use, previous stroke, DVT, renal artery stenosis, peripheral vascular disease, atrial fibrillation ( on Eliquis ), hyperlipidemia, hypertension, COPD, and chronic kidney disease, presenting with right-sided weakness and aphasia due to occlusion of terminal left internal carotid artery.  She did not receive IV t-PA due to anticoagulation. S/P IR complete revascularization with solitaire device.  Stroke:  Dominant  infarct felt to be embolic secondary  to atrial fibrillation.  Resultant aphasia and right hemiparesis  MRI - Large acute infarct within the left MCA distribution  MRA - not performed  Carotid Doppler - refer to cerebral angiogram  2D Echo - will order  LDL - will order  HgbA1c - will order  VTE prophylaxis - subcutaneous heparin  Diet NPO time specified  Eliquis (apixaban) daily prior to admission, now on No antithrombotic  Ongoing aggressive stroke risk factor management  Therapy recommendations:  pending  Disposition:  Pending  Hypertension  Stable - On Cleviprex  Systolic BP Goal 086120 - 140 per Dr Corliss Skainseveshwar  Long-term BP goal normotensive  Hyperlipidemia  Home meds:  Zocor 20 mg daily not resumed in hospital  LDL pending, goal < 70  Resume Zocor when PO access.  Continue statin at discharge  Diabetes  HgbA1c pending, goal < 7.0  Uncontrolled    Mild mass effect with sulcal effacement and approximately 3 mm rightward midline shift  No plans for hypertonic saline at this time  Repeat head CT in a.m.    Other Stroke Risk Factors  Advanced age  Cigarette smoker - advised to stop smoking  Hx stroke/TIA   Other Active Problems  CKD  Intubated  NPO  PLAN  Start ASA suppository  Repeat head CT in the morning.  Continue aggressive blood pressure control and close neurological monitoring.   Hospital day # 1  Delton See PA-C [facility] Pager [phone] 07/15/2016, 1:26 PM  I have personally examined this [name], reviewed notes, independently viewed imaging studies, participated in medical decision making and plan of care.ROS completed by me personally and pertinent positives fully documented  I have made any additions or clarifications directly to the above note. Agree with note above.  [name] presented with a large left MCA infarct due to terminal left ICA thrombosis due to atrial fibrillation despite being on anticoagulation with eliquis and was  treated with mechanical thrombectomy with complete recanalization but neurological exam and MRI suggests fairly large left MCA infarct. She is likely going to have significant aphasia and right hemiparesis. I had a long discussion with the [name]'s family members at the bedside about her prognosis, plan for treatment and answered questions. Recommend continue ongoing supportive care and repeat CT scan of the head tomorrow and if there is no significant edema consider extubation. Family needs to decide about DO NOT RESUSCITATE status and intubation and trach and peg prior to elective extubation over the next few days. Discussed with Dr. Tyson Alias who agrees with plan This [name] is critically ill and at significant risk of neurological worsening, death and care requires constant monitoring of vital signs, hemodynamics,respiratory and cardiac monitoring, extensive review of multiple databases, frequent neurological assessment, discussion with family, other specialists and medical decision making of high complexity.I have made any additions or clarifications directly to the above note.This critical care time does not reflect procedure time, or teaching time or supervisory time of PA/NP/Med Resident etc but could involve care discussion time.  I spent 50 minutes of neurocritical care time  in the care of  this [name].     Delia Heady, MD Medical Director [facility] Pager: [phone] 07/15/2016 3:59 PM    To contact Stroke Continuity provider, please refer to [email or web address]. After hours, contact General Neurology

## 2016-07-15 NOTE — Progress Notes (Signed)
Referring Physician(s): Code Stroke  Supervising Physician: Julieanne Cotton  Patient Status:  Inpatient  Chief Complaint:  Left MCA stroke S/p Lt common carotid arteriogram,followed by complete revascularization of LT ICA T occlusion with x 1 pass with Solitaire 40 mm FR retrieval device and 3.6 mg of superselective IA INTEGRELIN achieving a TICI 3 reperfusion  Subjective:  Pt remains intubated on vent.  Family at bedside.  Allergies: Review of patient's allergies indicates no known allergies.  Medications: Prior to Admission medications   Medication Sig Start Date End Date Taking? Authorizing Provider  albuterol (PROVENTIL HFA;VENTOLIN HFA) 108 (90 BASE) MCG/ACT inhaler Inhale 2 puffs into the lungs every 6 (six) hours as needed for wheezing. 01/15/13   Avon Gully, MD  amLODipine (NORVASC) 2.5 MG tablet Take 2.5 mg by mouth daily.    Historical Provider, MD  amLODipine (NORVASC) 5 MG tablet Take 5 mg by mouth daily.    Historical Provider, MD  apixaban (ELIQUIS) 2.5 MG TABS tablet Take 2.5 mg by mouth 2 (two) times daily.    Historical Provider, MD  Fluticasone Furoate-Vilanterol (BREO ELLIPTA) 100-25 MCG/INH AEPB Inhale 1 puff into the lungs daily.    Historical Provider, MD  furosemide (LASIX) 40 MG tablet Take 1 tablet (40 mg total) by mouth daily. 04/11/15   Avon Gully, MD  labetalol (NORMODYNE) 300 MG tablet Take 300 mg by mouth 2 (two) times daily.  11/13/13   Avon Gully, MD  lisinopril (PRINIVIL,ZESTRIL) 40 MG tablet Take 1 tablet (40 mg total) by mouth daily. 11/13/13   Avon Gully, MD  Multiple Vitamin (MULTI VITAMIN DAILY) TABS Take by mouth.    Historical Provider, MD  potassium chloride SA (K-DUR,KLOR-CON) 20 MEQ tablet Take 2 tablets (40 mEq total) by mouth 2 (two) times daily. 11/13/13   Avon Gully, MD  simvastatin (ZOCOR) 20 MG tablet Take 20 mg by mouth every evening.    Historical Provider, MD  tiotropium (SPIRIVA) 18 MCG inhalation capsule Place  18 mcg into inhaler and inhale every morning. 01/15/13   Avon Gully, MD  Vitamin D, Cholecalciferol, 1000 UNITS TABS Take 1 capsule by mouth daily.    Historical Provider, MD     Vital Signs: BP (!) 117/59   Pulse 67   Temp 98.4 F (36.9 C) (Oral)   Resp 20   Ht 5\' 7"  (1.702 m)   Wt 140 lb 6.9 oz (63.7 kg)   SpO2 97%   BMI 21.99 kg/m   Physical Exam  Intubated on Vent Opened left eye upon touching her face Right groin with sheath in place, no bleeding   Imaging: Ct Head Wo Contrast  Result Date: 07/28/16 CLINICAL DATA:  Status post revascularization of the left internal carotid artery. EXAM: CT HEAD WITHOUT CONTRAST TECHNIQUE: Contiguous axial images were obtained from the base of the skull through the vertex without intravenous contrast. COMPARISON:  None. FINDINGS: Brain: Increased sulcal effacement is present since the prior study with increasing mass effect. There is some loss of the inferior insular cortex definition. The basal ganglia appear to be intact. There is hypoattenuation within the left temporal tip. Bifrontal encephalomalacia is otherwise stable with remote ischemic changes extending into the right internal and external capsule. No acute hemorrhage is present. The ventricles are stable. No significant extra-axial fluid collection is present. Vascular: Contrast is now present within the vessels. There is still some increased prominence to the the left middle cerebral artery and terminal ICA. Dense atherosclerotic calcifications are present within  vertebral arteries of the dural margins and within the cavernous internal carotid arteries. Skull: Remote frontal craniotomy is intact. No focal lytic or blastic lesions are present. Sinuses/Orbits: Increase fluid is present within the nasopharynx following intubation. Mild mucosal thickening is present through the ethmoid air cells. The remaining paranasal sinuses and mastoid air cells are clear. Other: No significant  extracranial soft tissue lesion is present. IMPRESSION: 1. Sulcal effacement in the left frontal and parietal lobe with slight increased sub cortical edema is concerning for developing infarct. There is some loss of gray-white differentiation in the left frontal lobe both at the level of basal ganglia and in the super ganglionic region. 2. Partial loss of gray-white differentiation suggesting developing infarct at the left insular cortex. 3. No evidence for hemorrhage. 4. Chronic bifrontal encephalomalacia. Electronically Signed   By: Marin Robertshristopher  Mattern M.D.   On: 07/30/2016 18:46   Mr Brain Wo Contrast  Result Date: 07/15/2016 CLINICAL DATA:  Stroke-like symptoms. Right-sided facial droop and right-sided weakness. Aphasia and drooling. EXAM: MRI HEAD WITHOUT CONTRAST TECHNIQUE: Multiplanar, multiecho pulse sequences of the brain and surrounding structures were obtained without intravenous contrast. COMPARISON:  Same day head CT. FINDINGS: Brain: There is extensive diffusion restriction throughout the left MCA distribution, involving the left frontal and parietal cortices, the left basal ganglia, left insula and a portion of the left temporal lobe. The midline structures are normal. There is hyperintense T2 weighted signal throughout the distribution of the infarction with sulcal effacement and rightward midline shift of approximately 3 mm. There is extensive left frontal encephalomalacia, which is post surgical in etiology. No acute hydrocephalus. No extra-axial fluid collection. Vascular: No evidence of chronic microhemorrhage or amyloid angiopathy. There is hemosiderin deposition along the left frontal postoperative site. Skull and upper cervical spine: The visualized skull base, calvarium, upper cervical spine and extracranial soft tissues are normal. Sinuses/Orbits: Fluid filling the nasopharynx. Atelectatic right maxillary sinus. Bilateral lens replacements. IMPRESSION: 1. Large acute infarct within the  left MCA distribution. No acute hemorrhage. 2. Mild mass effect with sulcal effacement and approximately 3 mm rightward midline shift. Electronically Signed   By: Deatra RobinsonKevin  Herman M.D.   On: 07/15/2016 02:31   Portable Chest Xray  Result Date: 07/17/2016 CLINICAL DATA:  Intubation EXAM: PORTABLE CHEST 1 VIEW COMPARISON:  Portable exam 1832 hours compared to 11/04/2015 FINDINGS: Tip of endotracheal tube projects 3.7 cm above carina. Enlargement of cardiac silhouette with vascular congestion. Atherosclerotic calcification aorta. Accentuation of perihilar markings improved from previous exam likely representing improved failure. No segmental consolidation, pleural effusion or pneumothorax. Bones unremarkable. IMPRESSION: Enlargement of cardiac silhouette with pulmonary vascular congestion. Tip of endotracheal tube projects 3.7 cm above carina. Electronically Signed   By: Ulyses SouthwardMark  Boles M.D.   On: 07/18/2016 19:50   Ct Head Code Stroke W/o Cm  Result Date: 07/26/2016 CLINICAL DATA:  Code stroke. Code stroke. New right facial droop. History of meningioma resection 1999 EXAM: CT HEAD WITHOUT CONTRAST TECHNIQUE: Contiguous axial images were obtained from the base of the skull through the vertex without intravenous contrast. COMPARISON:  CT head 11/09/2013 FINDINGS: Bifrontal craniotomy. Encephalomalacia in the frontal lobes bilaterally is stable and compatible with prior tumor resection. Chronic infarcts in the basal ganglia bilaterally unchanged. Negative for acute infarct, hemorrhage, or mass lesion. No acute skeletal abnormality. Increased density of the terminal left internal carotid artery which could be due to thrombosis. Possible extension into the left M1 segment. This was not present previously ASPECTS [facility]([place] Stroke Program Early CT  Score) - Ganglionic level infarction (caudate, lentiform nuclei, internal capsule, insula, M1-M3 cortex): 7 - Supraganglionic infarction (M4-M6 cortex): 3 Total score (0-10 with 10  being normal): 10 IMPRESSION: 1. Negative for acute infarct 2. ASPECTS is 10 3. Postop encephalomalacia in the frontal lobes bilaterally is stable. Chronic ischemia in the basal ganglia bilaterally is unchanged. 4. Hyperdense terminal left internal carotid artery suggestive of thrombosis. Consider CTA for further evaluation. These results were called by telephone at the time of interpretation on Jul 15, 2016 at 1:58 pm to Dr. Bethann Berkshire , who verbally acknowledged these results. Electronically Signed   By: Marlan Palau M.D.   On: 07-15-16 13:59    Labs:  CBC:  Recent Labs  11/07/15 0622 12/26/15 1645 2016/07/15 1351 07/15/16 0445  WBC 12.8* 5.4 7.1 7.9  HGB 11.4* 12.2 14.8 11.7*  HCT 37.0 39.3 46.0 37.8  PLT 247 274 183 162    COAGS:  Recent Labs  11/04/15 1049 July 15, 2016 1351  INR 1.28 1.01  APTT  --  29    BMP:  Recent Labs  11/07/15 0622 12/26/15 1645 July 15, 2016 1351 07/15/16 0445  NA 143 144 138 143  K 5.3* 3.8 3.6 4.2  CL 112* 108 107 111  CO2 24 28 27 22   GLUCOSE 134* 108* 168* 118*  BUN 82* 31* 59* 55*  CALCIUM 8.9 9.0 9.0 8.7*  CREATININE 3.03* 2.26* 3.34* 3.48*  GFRNONAA 13* 19* 12* 11*  GFRAA 15* 22* 14* 13*    LIVER FUNCTION TESTS:  Recent Labs  11/04/15 1049 07/15/16 1351  BILITOT 0.8 1.1  AST 17 24  ALT 18 19  ALKPHOS 80 88  PROT 6.6 7.4  ALBUMIN 3.6 3.7    Assessment and Plan:  L MCA CVA   S/p Lt common carotid arteriogram,followed by complete revascularization of LT ICA T occlusion by Dr. Corliss Skains.  Plan sheath removal today = orders in place   Electronically Signed: Jerry Caras Adea Geisel PA-C 07/15/2016, 10:11 AM   I spent a total of 15 Minutes at the the patient's bedside AND on the patient's hospital floor or unit, greater than 50% of which was counseling/coordinating care for s/p carotid/cerebral agram/revasc

## 2016-07-15 NOTE — Progress Notes (Signed)
   RN calling emD  - bp dropped with cleviprex and now gtt is off and sbp is 150   Plan Monitor off cleviprex   Dr. Kalman ShanMurali Maigen Mozingo, M.D., [facility] HospitalF.C.C.P Pulmonary and Critical Care Medicine Staff Physician [facility]  Pulmonary and Critical Care Pager: [phone], If no answer or between  15:00h - 7:00h: call [phone]  07/15/2016 1:02 AM

## 2016-07-15 NOTE — Consult Note (Signed)
PULMONARY / CRITICAL CARE MEDICINE   Name: [name] MRN: [id number] DOB: [birth year]    ADMISSION DATE:  07/30/2016 CONSULTATION DATE:  07/17/2016  REFERRING MD:  Dr. Pearlean Brownie  CHIEF COMPLAINT:  CVA  HISTORY OF PRESENT ILLNESS:  Patient is encephalopathic and/or intubated. Therefore history has been obtained from chart review.  82 year old female with PMH as below, which is significant for CHF, CKD IV, HTN, DVT, HTN, and PAF on eliquis. She presented to [facility] with stroke like symptoms (R sided facial droop and R weakness, aphasia, and drooling) and CT head demonstrated MI and L ICA thrombosis. She takes eliquis for PAF so she could not receive TPA. She was also profoundly hypertensive with SBP well into the 200s. She was transferred to [facility], where she underwent revascularization by interventional radiology. Post IR procedure she is sent to ICU intubated for recovery. Upon arrival to ICU she remained hypertensive with SBP 250s.  SUBJECTIVE: drop in BP on clev, now off  VITAL SIGNS: BP 130/68   Pulse 67   Temp 98.4 F (36.9 C) (Oral)   Resp 20   Ht 5\' 7"  (1.702 m)   Wt 63.7 kg (140 lb 6.9 oz)   SpO2 97%   BMI 21.99 kg/m   HEMODYNAMICS:    VENTILATOR SETTINGS: Vent Mode: PRVC FiO2 (%):  [50 %] 50 % Set Rate:  [16 bmp-20 bmp] 20 bmp Vt Set:  [480 mL] 480 mL PEEP:  [5 cmH20] 5 cmH20 Plateau Pressure:  [14 cmH20-17 cmH20] 16 cmH20  INTAKE / OUTPUT: I/O last 3 completed shifts: In: 1599.8 [I.V.:1549.8; IV Piggyback:50] Out: 360 [Urine:160; Emesis/NG output:150; Blood:50]  PHYSICAL EXAMINATION: General:  Elderly female in NAD Neuro:  Propofol rass deep, moves all ext except rt arm, perr ireg HEENT: [place]/AT, PERRL, no JVD Cardiovascular:  RRR, no MRG Lungs:  Clear Abdomen:  [place]/AT, PERRL, no JVD Musculoskeletal:  No acute deformity or ROM limitation Skin:  Grossly intact  LABS:  BMET  Recent Labs Lab 07/23/2016 1351 07/15/16 0445  NA 138 143  K 3.6 4.2   CL 107 111  CO2 27 22  BUN 59* 55*  CREATININE 3.34* 3.48*  GLUCOSE 168* 118*    Electrolytes  Recent Labs Lab 07/09/2016 1351 07/15/16 0445  CALCIUM 9.0 8.7*    CBC  Recent Labs Lab 08/01/2016 1351 07/15/16 0445  WBC 7.1 7.9  HGB 14.8 11.7*  HCT 46.0 37.8  PLT 183 162    Coag's  Recent Labs Lab 07/18/2016 1351  APTT 29  INR 1.01    Sepsis Markers No results for input(s): LATICACIDVEN, PROCALCITON, O2SATVEN in the last 168 hours.  ABG  Recent Labs Lab 07/09/2016 1842 07/15/16 0449  PHART 7.290* 7.418  PCO2ART 46.9 32.0  PO2ART 141.0* 154*    Liver Enzymes  Recent Labs Lab 07/15/2016 1351  AST 24  ALT 19  ALKPHOS 88  BILITOT 1.1  ALBUMIN 3.7    Cardiac Enzymes No results for input(s): TROPONINI, PROBNP in the last 168 hours.  Glucose  Recent Labs Lab 07/13/2016 1407  GLUCAP 148*    Imaging Ct Head Wo Contrast  Result Date: 07/29/2016 CLINICAL DATA:  Status post revascularization of the left internal carotid artery. EXAM: CT HEAD WITHOUT CONTRAST TECHNIQUE: Contiguous axial images were obtained from the base of the skull through the vertex without intravenous contrast. COMPARISON:  None. FINDINGS: Brain: Increased sulcal effacement is present since the prior study with increasing mass effect. There is some loss  of the inferior insular cortex definition. The basal ganglia appear to be intact. There is hypoattenuation within the left temporal tip. Bifrontal encephalomalacia is otherwise stable with remote ischemic changes extending into the right internal and external capsule. No acute hemorrhage is present. The ventricles are stable. No significant extra-axial fluid collection is present. Vascular: Contrast is now present within the vessels. There is still some increased prominence to the the left middle cerebral artery and terminal ICA. Dense atherosclerotic calcifications are present within vertebral arteries of the dural margins and within the  cavernous internal carotid arteries. Skull: Remote frontal craniotomy is intact. No focal lytic or blastic lesions are present. Sinuses/Orbits: Increase fluid is present within the nasopharynx following intubation. Mild mucosal thickening is present through the ethmoid air cells. The remaining paranasal sinuses and mastoid air cells are clear. Other: No significant extracranial soft tissue lesion is present. IMPRESSION: 1. Sulcal effacement in the left frontal and parietal lobe with slight increased sub cortical edema is concerning for developing infarct. There is some loss of gray-white differentiation in the left frontal lobe both at the level of basal ganglia and in the super ganglionic region. 2. Partial loss of gray-white differentiation suggesting developing infarct at the left insular cortex. 3. No evidence for hemorrhage. 4. Chronic bifrontal encephalomalacia. Electronically Signed   By: Marin Roberts M.D.   On: 07/20/2016 18:46   Mr Brain Wo Contrast  Result Date: 07/15/2016 CLINICAL DATA:  Stroke-like symptoms. Right-sided facial droop and right-sided weakness. Aphasia and drooling. EXAM: MRI HEAD WITHOUT CONTRAST TECHNIQUE: Multiplanar, multiecho pulse sequences of the brain and surrounding structures were obtained without intravenous contrast. COMPARISON:  Same day head CT. FINDINGS: Brain: There is extensive diffusion restriction throughout the left MCA distribution, involving the left frontal and parietal cortices, the left basal ganglia, left insula and a portion of the left temporal lobe. The midline structures are normal. There is hyperintense T2 weighted signal throughout the distribution of the infarction with sulcal effacement and rightward midline shift of approximately 3 mm. There is extensive left frontal encephalomalacia, which is post surgical in etiology. No acute hydrocephalus. No extra-axial fluid collection. Vascular: No evidence of chronic microhemorrhage or amyloid  angiopathy. There is hemosiderin deposition along the left frontal postoperative site. Skull and upper cervical spine: The visualized skull base, calvarium, upper cervical spine and extracranial soft tissues are normal. Sinuses/Orbits: Fluid filling the nasopharynx. Atelectatic right maxillary sinus. Bilateral lens replacements. IMPRESSION: 1. Large acute infarct within the left MCA distribution. No acute hemorrhage. 2. Mild mass effect with sulcal effacement and approximately 3 mm rightward midline shift. Electronically Signed   By: Deatra Robinson M.D.   On: 07/15/2016 02:31   Portable Chest Xray  Result Date: 07/13/2016 CLINICAL DATA:  Intubation EXAM: PORTABLE CHEST 1 VIEW COMPARISON:  Portable exam 1832 hours compared to 11/04/2015 FINDINGS: Tip of endotracheal tube projects 3.7 cm above carina. Enlargement of cardiac silhouette with vascular congestion. Atherosclerotic calcification aorta. Accentuation of perihilar markings improved from previous exam likely representing improved failure. No segmental consolidation, pleural effusion or pneumothorax. Bones unremarkable. IMPRESSION: Enlargement of cardiac silhouette with pulmonary vascular congestion. Tip of endotracheal tube projects 3.7 cm above carina. Electronically Signed   By: Ulyses Southward M.D.   On: 07/30/2016 19:50   Ct Head Code Stroke W/o Cm  Result Date: 07/20/2016 CLINICAL DATA:  Code stroke. Code stroke. New right facial droop. History of meningioma resection 1999 EXAM: CT HEAD WITHOUT CONTRAST TECHNIQUE: Contiguous axial images were obtained from the base  of the skull through the vertex without intravenous contrast. COMPARISON:  CT head 11/09/2013 FINDINGS: Bifrontal craniotomy. Encephalomalacia in the frontal lobes bilaterally is stable and compatible with prior tumor resection. Chronic infarcts in the basal ganglia bilaterally unchanged. Negative for acute infarct, hemorrhage, or mass lesion. No acute skeletal abnormality. Increased density of  the terminal left internal carotid artery which could be due to thrombosis. Possible extension into the left M1 segment. This was not present previously ASPECTS [facility]([place] Stroke Program Early CT Score) - Ganglionic level infarction (caudate, lentiform nuclei, internal capsule, insula, M1-M3 cortex): 7 - Supraganglionic infarction (M4-M6 cortex): 3 Total score (0-10 with 10 being normal): 10 IMPRESSION: 1. Negative for acute infarct 2. ASPECTS is 10 3. Postop encephalomalacia in the frontal lobes bilaterally is stable. Chronic ischemia in the basal ganglia bilaterally is unchanged. 4. Hyperdense terminal left internal carotid artery suggestive of thrombosis. Consider CTA for further evaluation. These results were called by telephone at the time of interpretation on 07/27/2016 at 1:58 pm to Dr. Bethann BerkshireJOSEPH ZAMMIT , who verbally acknowledged these results. Electronically Signed   By: Marlan Palauharles  Clark M.D.   On: 07/12/2016 13:59     STUDIES:  CT head 9/6 > Sulcal effacement in the left frontal and parietal lobe with slight increased sub cortical edema is concerning for developing infarct. There is some loss of gray-white differentiation in the left frontal lobe both at the level of basal ganglia and in the super ganglionic region. Partial loss of gray-white differentiation suggesting developing infarct at the left insular cortex. No evidence for hemorrhage.  CULTURES:   ANTIBIOTICS:   SIGNIFICANT EVENTS: 9/6 admit CVA, IR, out on vent 9/7- hypotension / hypertension  LINES/TUBES: ETT 9/6 > R and L Fem Art sheath x2 9/6 >>>  DISCUSSION: 54F with complex medical history including CHF, PAF on eliquis, and HTN admitted for CVA and went emergently to IR for revascularization. Remains hypertensive with SBPs into 200s. Will plan for BP management per Dr. Corliss Skainseveshwar recommendations with clevidipine and PRNs. MRI in AM. Keep sedated on vent overnight.   ASSESSMENT / PLAN:  NEUROLOGIC A:   L MCA CVA s/p IR  revascularization  MRi reviewed, looks like poor prognosis for fxnal recovery P:   RASS goal: 0 Propofol infusion for sedation - WUA BP control - clav Management per stroke service (primary) Prophylactic therapy per stroke Echo, carotid dopplers Role 3%?, will d/w stroke  PULMONARY A: Inability to protect airway in setting CVA, medical sedation COPD without exacerbation pacthy infiltrates? At risk edema P:   ABg reviewed, rate to 16 Weaning ps cpap 5/5, goal 1 hr, avoid acidosis Duonebs, budesonide per home regimen > to nebs May need cvp  CARDIOVASCULAR A:  Hypertensive Emergency Chronic diastolic CHF PAF  P:  SBP goals 120-140 per neurosurgery Holding home amlodipine, eliquis, lasix, labetalol, statin while NPO PRN labetalol, hydralazine Clevidipine infusion, max this agent BP may rise after prop reduction Echo for clot If rate rises would change agent to rate control agent May need cvp  RENAL A:   AKI CKD IV  P:   KVO fluids Follow BMP Reduce fluids No free water] Consider 3% May need cvp  GASTROINTESTINAL A:   No acute issues  P:   NPO Pepcid daily for SUP Start feeds  HEMATOLOGIC A:   DVT prevention  P:  Follow CBC in am  scd Add sub q hep NO lovenox with renal dz  INFECTIOUS A:   No acute issues  P:  Follow WBC and fever curve Follow patchy infiltrates?  ENDOCRINE A:   No acute issues    P:   Follow BMP for glucose monitoring   FAMILY  - Updates: I updated family 9/7  - Inter-disciplinary family meet or Palliative Care meeting due by:  9/13  Ccm time 50 min   Mcarthur Rossetti. Tyson Alias, MD, FACP Pgr: [phone] [facility] 07/15/2016 9:04 AM

## 2016-07-15 NOTE — Progress Notes (Signed)
SLP Cancellation Note  Patient Details Name: Gentry FitzRichetta N Hilligoss MRN: [id number] DOB: January 28, 1931   Cancelled treatment:       Reason Eval/Treat Not Completed: Patient not medically ready, remains intubated. Will follow for readiness to participate in swallow evaluation.   Maxcine Hamaiewonsky, Chantrell Apsey 07/15/2016, 9:44 AM  Maxcine HamLaura Paiewonsky, M.A. CCC-SLP [phone]

## 2016-07-15 NOTE — Progress Notes (Signed)
Initial Nutrition Assessment  DOCUMENTATION CODES:   Not applicable  INTERVENTION:    Initiate TF via OGT with Vital AF 1.2 at goal rate of 50 ml/h (1200 ml per day) to provide 1440 kcals, 90 gm protein, 973 ml free water daily.  NUTRITION DIAGNOSIS:   Inadequate oral intake related to inability to eat as evidenced by NPO status.  GOAL:   Patient will meet greater than or equal to 90% of their needs  MONITOR:   Vent status, Labs, TF tolerance, I & O's  REASON FOR ASSESSMENT:   Consult Enteral/tube feeding initiation and management  ASSESSMENT:   82 yo F with complex medical history including CHF, PAF, and HTN admitted for CVA and went emergently to IR for revascularization.  Received MD Consult for TF initiation and management. Labs reviewed: phosphorus elevated. Medications reviewed. Patient is currently intubated on ventilator support MV: 11.6 L/min Temp (24hrs), Avg:97.9 F (36.6 C), Min:95.9 F (35.5 C), Max:99.5 F (37.5 C)  Propofol: off  Nutrition-Focused physical exam completed. Findings are no fat depletion, mild-moderate muscle depletion, and mild edema.  Per review of usual weights in EMR, patient has been progressively losing weight over the past year (17% weight loss within the past 9 months). Weight loss is not significant for the time frame.   Diet Order:  Diet NPO time specified  Skin:  Reviewed, no issues  Last BM:  PTA  Height:   Ht Readings from Last 1 Encounters:  07/18/2016 5\' 7"  (1.702 m)    Weight:   Wt Readings from Last 1 Encounters:  07/15/16 140 lb 6.9 oz (63.7 kg)    Ideal Body Weight:  61.4 kg  BMI:  Body mass index is 21.99 kg/m.  Estimated Nutritional Needs:   Kcal:  1496  Protein:  90-100 gm  Fluid:  >/= 1.5 L  EDUCATION NEEDS:   No education needs identified at this time  Joaquin CourtsKimberly Harris, RD, LDN, CNSC Pager [phone] After Hours Pager [phone]

## 2016-07-16 ENCOUNTER — Inpatient Hospital Stay (HOSPITAL_COMMUNITY): Payer: Medicare Other

## 2016-07-16 DIAGNOSIS — J988 Other specified respiratory disorders: Secondary | ICD-10-CM

## 2016-07-16 LAB — COMPREHENSIVE METABOLIC PANEL
ALK PHOS: 66 U/L (ref 38–126)
ALT: 8 U/L — AB (ref 14–54)
AST: 24 U/L (ref 15–41)
Albumin: 2.8 g/dL — ABNORMAL LOW (ref 3.5–5.0)
Anion gap: 12 (ref 5–15)
BUN: 69 mg/dL — AB (ref 6–20)
CALCIUM: 8.8 mg/dL — AB (ref 8.9–10.3)
CO2: 16 mmol/L — AB (ref 22–32)
CREATININE: 4.88 mg/dL — AB (ref 0.44–1.00)
Chloride: 110 mmol/L (ref 101–111)
GFR calc non Af Amer: 7 mL/min — ABNORMAL LOW (ref >60)
GFR, EST AFRICAN AMERICAN: 9 mL/min — AB (ref >60)
GLUCOSE: 179 mg/dL — AB (ref 65–99)
Potassium: 4.5 mmol/L (ref 3.5–5.1)
SODIUM: 138 mmol/L (ref 135–145)
Total Bilirubin: 0.5 mg/dL (ref 0.3–1.2)
Total Protein: 5.4 g/dL — ABNORMAL LOW (ref 6.5–8.1)

## 2016-07-16 LAB — LIPID PANEL
Cholesterol: 98 mg/dL (ref 0–200)
HDL: 35 mg/dL — ABNORMAL LOW (ref >40)
LDL CALC: 33 mg/dL (ref 0–99)
TRIGLYCERIDES: 150 mg/dL — AB (ref <150)
Total CHOL/HDL Ratio: 2.8 RATIO
VLDL: 30 mg/dL (ref 0–40)

## 2016-07-16 LAB — CBC WITH DIFFERENTIAL/PLATELET
BASOS ABS: 0 10*3/uL (ref 0.0–0.1)
Basophils Relative: 0 %
EOS ABS: 0 10*3/uL (ref 0.0–0.7)
Eosinophils Relative: 0 %
HCT: 34.5 % — ABNORMAL LOW (ref 36.0–46.0)
HEMOGLOBIN: 10.7 g/dL — AB (ref 12.0–15.0)
LYMPHS ABS: 1 10*3/uL (ref 0.7–4.0)
LYMPHS PCT: 6 %
MCH: 27.4 pg (ref 26.0–34.0)
MCHC: 31 g/dL (ref 30.0–36.0)
MCV: 88.2 fL (ref 78.0–100.0)
Monocytes Absolute: 1.6 10*3/uL — ABNORMAL HIGH (ref 0.1–1.0)
Monocytes Relative: 9 %
NEUTROS PCT: 85 %
Neutro Abs: 14.1 10*3/uL — ABNORMAL HIGH (ref 1.7–7.7)
Platelets: 166 10*3/uL (ref 150–400)
RBC: 3.91 MIL/uL (ref 3.87–5.11)
RDW: 15.2 % (ref 11.5–15.5)
WBC: 16.6 10*3/uL — AB (ref 4.0–10.5)

## 2016-07-16 LAB — MAGNESIUM
Magnesium: 2 mg/dL (ref 1.7–2.4)
Magnesium: 2.3 mg/dL (ref 1.7–2.4)

## 2016-07-16 LAB — GLUCOSE, CAPILLARY
GLUCOSE-CAPILLARY: 131 mg/dL — AB (ref 65–99)
GLUCOSE-CAPILLARY: 140 mg/dL — AB (ref 65–99)
GLUCOSE-CAPILLARY: 160 mg/dL — AB (ref 65–99)
GLUCOSE-CAPILLARY: 183 mg/dL — AB (ref 65–99)
Glucose-Capillary: 131 mg/dL — ABNORMAL HIGH (ref 65–99)

## 2016-07-16 LAB — PHOSPHORUS
PHOSPHORUS: 6.5 mg/dL — AB (ref 2.5–4.6)
Phosphorus: 5.8 mg/dL — ABNORMAL HIGH (ref 2.5–4.6)

## 2016-07-16 MED ORDER — SIMVASTATIN 20 MG PO TABS
20.0000 mg | ORAL_TABLET | Freq: Every evening | ORAL | Status: DC
  Administered 2016-07-16: 20 mg via ORAL
  Filled 2016-07-16: qty 1

## 2016-07-16 MED ORDER — ORAL CARE MOUTH RINSE
15.0000 mL | OROMUCOSAL | Status: DC
  Administered 2016-07-16 – 2016-07-18 (×21): 15 mL via OROMUCOSAL

## 2016-07-16 MED ORDER — AMLODIPINE BESYLATE 5 MG PO TABS
7.5000 mg | ORAL_TABLET | Freq: Every day | ORAL | Status: DC
  Administered 2016-07-16: 7.5 mg via ORAL
  Filled 2016-07-16: qty 1

## 2016-07-16 MED ORDER — ACETAMINOPHEN 160 MG/5ML PO SOLN: 650.0000 mg | Freq: Four times a day (QID) | ORAL | Status: DC | PRN

## 2016-07-16 MED ORDER — CHLORHEXIDINE GLUCONATE 0.12 % MT SOLN
15.0000 mL | Freq: Two times a day (BID) | OROMUCOSAL | Status: DC
  Administered 2016-07-16 – 2016-07-18 (×4): 15 mL via OROMUCOSAL
  Filled 2016-07-16: qty 15

## 2016-07-16 MED ORDER — LABETALOL HCL 100 MG PO TABS
300.0000 mg | ORAL_TABLET | Freq: Two times a day (BID) | ORAL | Status: DC
  Administered 2016-07-16 (×2): 300 mg via ORAL
  Filled 2016-07-16 (×2): qty 3

## 2016-07-16 MED ORDER — PANTOPRAZOLE SODIUM 40 MG PO PACK
40.0000 mg | PACK | ORAL | Status: DC
  Administered 2016-07-16: 40 mg
  Filled 2016-07-16: qty 20

## 2016-07-16 NOTE — Progress Notes (Addendum)
PULMONARY / CRITICAL CARE MEDICINE   Name: [name] MRN: [id number] DOB: 02-14-1931    ADMISSION DATE:  August 05, 2016 CONSULTATION DATE:  05-Aug-2016  REFERRING MD:  Dr. Pearlean Brownie  CHIEF COMPLAINT:  Facial droop, Rt side weakness, aphasia  SUBJECTIVE:  Tolerating pressure support.  VITAL SIGNS: BP (!) 131/59   Pulse 91   Temp 98.5 F (36.9 C) (Axillary)   Resp (!) 23   Ht 5\' 7"  (1.702 m)   Wt 142 lb 10.2 oz (64.7 kg)   SpO2 100%   BMI 22.34 kg/m   VENTILATOR SETTINGS: Vent Mode: PRVC FiO2 (%):  [40 %] 40 % Set Rate:  [16 bmp] 16 bmp Vt Set:  [480 mL] 480 mL PEEP:  [5 cmH20] 5 cmH20 Pressure Support:  [8 cmH20] 8 cmH20  INTAKE / OUTPUT: I/O last 3 completed shifts: In: 3471.2 [I.V.:2464.5; NG/GT:656.7; IV Piggyback:350] Out: 391 [Urine:179; Emesis/NG output:212]  PHYSICAL EXAMINATION: General: on vent Neuro: opens eyes and grimaces with stimulation HEENT: ETT in place Cardiac: regular, no murmur Chest: no wheeze Abd: soft, non tender Ext: no edema Skin: no rashes  LABS:  BMET  Recent Labs Lab 05-Aug-2016 1351 07/15/16 0445 07/16/16 0407  NA 138 143 138  K 3.6 4.2 4.5  CL 107 111 110  CO2 27 22 16*  BUN 59* 55* 69*  CREATININE 3.34* 3.48* 4.88*  GLUCOSE 168* 118* 179*    Electrolytes  Recent Labs Lab 2016-08-05 1351 07/15/16 0445 07/15/16 1020 07/15/16 1700 07/16/16 0407  CALCIUM 9.0 8.7*  --   --  8.8*  MG  --   --  2.1 2.0 2.0  PHOS  --   --  4.9* 5.2* 5.8*    CBC  Recent Labs Lab 08-05-16 1351 07/15/16 0445 07/16/16 0407  WBC 7.1 7.9 16.6*  HGB 14.8 11.7* 10.7*  HCT 46.0 37.8 34.5*  PLT 183 162 166    Coag's  Recent Labs Lab 08-05-2016 1351  APTT 29  INR 1.01    Sepsis Markers No results for input(s): LATICACIDVEN, PROCALCITON, O2SATVEN in the last 168 hours.  ABG  Recent Labs Lab 05-Aug-2016 1842 07/15/16 0449  PHART 7.290* 7.418  PCO2ART 46.9 32.0  PO2ART 141.0* 154*    Liver Enzymes  Recent Labs Lab  Aug 05, 2016 1351 07/16/16 0407  AST 24 24  ALT 19 8*  ALKPHOS 88 66  BILITOT 1.1 0.5  ALBUMIN 3.7 2.8*    Cardiac Enzymes No results for input(s): TROPONINI, PROBNP in the last 168 hours.  Glucose  Recent Labs Lab 2016/08/05 1407 07/15/16 2001 07/16/16 0759  GLUCAP 148* 185* 160*    Imaging Ct Head Wo Contrast  Result Date: 07/16/2016 CLINICAL DATA:  Status post left MCA infarct and revascularization. EXAM: CT HEAD WITHOUT CONTRAST TECHNIQUE: Contiguous axial images were obtained from the base of the skull through the vertex without intravenous contrast. COMPARISON:  None. FINDINGS: Brain: There is persistent left MCA distribution cytotoxic edema with associated sulcal effacement. 2-3 mm of rightward midline shift is unchanged. There is no acute hemorrhage. Left frontal encephalomalacia is unchanged. Right frontal periventricular hypoattenuation is also unchanged. Vascular: Atherosclerotic calcification within the vertebral artery is. Skull: Remote bifrontal craniotomy. Sinuses/Orbits: Free of fluid.  Normal orbits. Other: Visualized extracranial soft tissues are unremarkable. IMPRESSION: 1. Unchanged appearance of left MCA distribution infarct with associated sulcal effacement and 2-3 mm of rightward midline shift. 2. No hemorrhagic transformation. 3. No hydrocephalus. Electronically Signed   By: Deatra Robinson M.D.   On: 07/16/2016  04:09   Dg Chest Port 1 View  Result Date: 07/16/2016 CLINICAL DATA:  82 year old female intubated. Presented as Code stroke with left ICA occlusion. EXAM: PORTABLE CHEST 1 VIEW COMPARISON:  2016/06/22 and earlier. FINDINGS: Portable AP semi upright view at at 0629 hours. Endotracheal tube has been slightly advanced, projects 2-3 cm above the carina. Enteric tube courses to the abdomen, with tip visible in the left upper quadrant. Stable cardiomegaly and mediastinal contours. Stable lung volumes. No pneumothorax, pleural effusion or consolidation. Pulmonary  vascular congestion without overt edema. IMPRESSION: 1. Enteric tube now in place, terminates in the left upper quadrant at the level of the stomach. 2. ET tube remains in good position. 3. Stable cardiomegaly and mild vascular congestion without overt edema. Electronically Signed   By: Odessa FlemingH  Hall M.D.   On: 07/16/2016 08:45     STUDIES:  CT head 9/6 >> post op encephalomalacia frontal lobes b/l, chronic ischemia in b/l basal ganglia, thrombosis Lt ICA CT head 9/06 >> developing Lt frontal/parietal infarct and Lt insular cortex MRI brain 9/07 >> Large acute Lt MCA distribution infarct, 3 mm rightward shift CT head 9/08 >> no change  CULTURES:  ANTIBIOTICS:  SIGNIFICANT EVENTS: 9/06 admit CVA, IR, out on vent 9/07 hypotension / hypertension  LINES/TUBES: ETT 9/06 > R and L Fem Art sheath x2 9/06 >>>  DISCUSSION: 82 yo female smoker with facial droop, Rt sided weakness, aphasia from Lt MCA infarct and HTN emergency (SBP 200).  Had neuro IR intervention and intubated for procedure.  She has PMHx of diastolic CHF, A fib on eliquis, HTN, HLD, CKD, COPD  ASSESSMENT / PLAN:  NEUROLOGIC A:   Lt MCA distribution CVA. P:   RASS goal 0  PULMONARY A: Compromised airway in setting of CVA. Hx of COPD. P:   Pressure support wean as tolerated >> need to clarify goals of care prior to extubation attempt Pulmicort, duoneb F/u CXR  CARDIOVASCULAR A:  HTN emergency. Hx of A fib, chronic diastolic CHF. P:  Wean off clevidipine Resume labetalol, amlodipine, simvastatin Hold outpt lasix, eliquis, lisinopril for now  RENAL A:   CKD IV. Non gap metabolic acidosis. P:   Monitor renal fx, urine outpt  GASTROINTESTINAL A:   Nutrition. P:   Tube feeds Protonix for SUP  HEMATOLOGIC A:   Anemia of critical illness. P:  F/u CBC  INFECTIOUS A:   No evidence for infection. P:   Monitor clinically  ENDOCRINE A:   Hyperglycemia. P:   Monitor blood sugar on BMET  D/w Dr.  Pearlean BrownieSethi.  Summary: Poor prognosis for meaningful neuro recover.  She also has progress renal dysfunction and would not be suitable candidate for dialysis.  Neurology to speak with family about goals of care >> transition to comfort measures likely best options.  CC time 38 minutes.  Coralyn HellingVineet Jamorian Dimaria, MD [facility] 07/16/2016, 9:41 AM Pager:  [phone] After 3pm call: [phone]

## 2016-07-16 NOTE — Care Management Important Message (Signed)
Important Message  Patient Details  Name: [name] MRN: [id number] Date of Birth: [birth year]   Medicare Important Message Given:  Other (see comment)    [name] 07/16/2016, 11:29 AM

## 2016-07-16 NOTE — Progress Notes (Signed)
19F sheath removed no complications.  VPad applied pressure dressing  Reviewed with RN Amy.

## 2016-07-16 NOTE — Progress Notes (Signed)
Patient transported on vent from 52M-14 to 73M-06 without complications.

## 2016-07-16 NOTE — Progress Notes (Signed)
STROKE TEAM PROGRESS NOTE   HISTORY OF PRESENT ILLNESS (per record) [name] is an 82 y.o. female with hx as below presented to OSH with L MCA stroke like symptoms that started at 1300. 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 [facility]   Date last known well: 08/02/2016 Time last known well: 1300 tPA Given: No: on Eliquis    CEREBRAL ANGIOGRAM [ZOX0960[SHX1326 (Custom)] S/p Lt common carotid arteriogram,followed by complete revascularization of LT ICA T occlusion with x 1 pass with Solitaire 4mmx 40 mm FR retrieval device and 3.6 mg of superselective IA INTEGRELIN achieving a TICI 3 reperfusion.     SUBJECTIVE (INTERVAL HISTORY) The patient's 2 daughters were at the bedside. The patient is intubated and sedated. She remains neurologically unchanged. Aphasic with dense right hemiplegia. Repeat CT scan of the head this morning shows moderate size left MCA infarct with cytotoxic edema and 3 mm left to right midline shift. No hemorrhage. Patient`s right groin sheath came out last night with some hematoma. Left groin sheath is been pulled this morning.  OBJECTIVE Temp:  [98.1 F (36.7 C)-99.5 F (37.5 C)] 99.5 F (37.5 C) (09/08 1200) Pulse Rate:  [25-113] 91 (09/08 1120) Cardiac Rhythm: Atrial fibrillation (09/08 0800) Resp:  [18-38] 30 (09/08 1120) BP: (110-157)/(51-83) 136/61 (09/08 1120) SpO2:  [99 %-100 %] 100 % (09/08 1120) Arterial Line BP: (89-152)/(46-82) 89/82 (09/08 0900) FiO2 (%):  [40 %] 40 % (09/08 1120) Weight:  [142 lb 10.2 oz (64.7 kg)] 142 lb 10.2 oz (64.7 kg) (09/08 0400)  CBC:   Recent Labs Lab 07/15/16 0445 07/16/16 0407  WBC 7.9 16.6*  NEUTROABS 6.5 14.1*  HGB 11.7* 10.7*  HCT 37.8 34.5*  MCV 88.7 88.2  PLT 162 166    Basic Metabolic Panel:  Recent Labs Lab 07/15/16 0445  07/15/16 1700 07/16/16 0407  NA 143  --   --  138  K 4.2  --   --  4.5  CL 111  --   --  110  CO2 22   --   --  16*  GLUCOSE 118*  --   --  179*  BUN 55*  --   --  69*  CREATININE 3.48*  --   --  4.88*  CALCIUM 8.7*  --   --  8.8*  MG  --   < > 2.0 2.0  PHOS  --   < > 5.2* 5.8*  < > = values in this interval not displayed.  Lipid Panel:     Component Value Date/Time   CHOL 98 07/16/2016 0406   TRIG 150 (H) 07/16/2016 0406   HDL 35 (L) 07/16/2016 0406   CHOLHDL 2.8 07/16/2016 0406   VLDL 30 07/16/2016 0406   LDLCALC 33 07/16/2016 0406   HgbA1c:  Lab Results  Component Value Date   HGBA1C 5.8 (H) 11/04/2015   Urine Drug Screen:     Component Value Date/Time   LABOPIA NONE DETECTED 07/22/2016 2033   COCAINSCRNUR NONE DETECTED 07/15/2016 2033   LABBENZ NONE DETECTED 08/04/2016 2033   AMPHETMU NONE DETECTED 07/10/2016 2033   THCU NONE DETECTED 07/27/2016 2033   LABBARB NONE DETECTED 07/30/2016 2033      IMAGING  Ct Head Wo Contrast 07/20/2016 1. Sulcal effacement in the left frontal and parietal lobe with slight increased sub cortical edema is concerning for developing infarct. There is some loss of gray-white differentiation  in the left frontal lobe both at the level of basal ganglia and in the super ganglionic region.  2. Partial loss of gray-white differentiation suggesting developing infarct at the left insular cortex.  3. No evidence for hemorrhage.  4. Chronic bifrontal encephalomalacia.    Mr Brain Wo Contrast 07/15/2016 1. Large acute infarct within the left MCA distribution. No acute hemorrhage.  2. Mild mass effect with sulcal effacement and approximately 3 mm rightward midline shift.     Portable Chest Xray 08-02-2016 Enlargement of cardiac silhouette with pulmonary vascular congestion. Tip of endotracheal tube projects 3.7 cm above carina.    Ct Head Code Stroke W/o Cm 2016-08-02 1. Negative for acute infarct  2. ASPECTS is 10  3. Postop encephalomalacia in the frontal lobes bilaterally is stable. Chronic ischemia in the basal ganglia bilaterally is  unchanged.  4. Hyperdense terminal left internal carotid artery suggestive of thrombosis.   Consider CTA for further evaluation.      CEREBRAL ANGIOGRAM [ZOX0960 (Custom)] S/p Lt common carotid arteriogram,followed by complete revascularization of LT ICA T occlusion with x 1 pass with Solitaire 40 mm FR retrieval device and 3.6 mg of superselective IA INTEGRELIN achieving a TICI 3 reperfusion.   PHYSICAL EXAM Elderly lady who is intubated. On mechanical ventilation. . Afebrile. Head is nontraumatic. Neck is supple without bruit.    Cardiac exam no murmur or gallop. Lungs are clear to auscultation. Distal pulses are well felt. Neurological Exam :  Patient is sedated. Eyes are closed. Opens eyes partially to sternal rub. Globally aphasic and will not follow any commands.  Left gaze deviation. Will not look to the right. Pupils irregular but reactive. Fundi were not visualized. Right lower facial weakness. Tongue midline. Patient had some spontaneous left upper and lower extremity movement. She'll move left sided extremities purposefully to sternal rub. No right upper extremity movements even to pain. Mild right lower extremity withdrawal to painful stimuli. Deep tendon reflexes normal on the left and depressed on the right. Right plantar upgoing left downgoing. Withdraws to painful stimuli more on the left than the right ASSESSMENT/PLAN [name] is a 82 y.o. female with history of tobacco use, previous stroke, DVT, renal artery stenosis, peripheral vascular disease, atrial fibrillation ( on Eliquis ), hyperlipidemia, hypertension, COPD, and chronic kidney disease, presenting with right-sided weakness and aphasia due to occlusion of terminal left internal carotid artery.  She did not receive IV t-PA due to anticoagulation. S/P IR complete revascularization with solitaire device.  Stroke:  Dominant  infarct felt to be embolic secondary to atrial fibrillation.  Resultant aphasia  and right hemiparesis  MRI - Large acute infarct within the left MCA distribution  MRA - not performed  Carotid Doppler - refer to cerebral angiogram  2D Echo - will order  LDL - will order  HgbA1c - will order  VTE prophylaxis - subcutaneous heparin Diet NPO time specified  Eliquis (apixaban) daily prior to admission, now on No antithrombotic  Ongoing aggressive stroke risk factor management  Therapy recommendations:  pending  Disposition:  Pending  Hypertension  Stable - On Cleviprex  Systolic BP Goal 454 - 140 per Dr Corliss Skains  Long-term BP goal normotensive  Hyperlipidemia  Home meds:  Zocor 20 mg daily not resumed in hospital  LDL pending, goal < 70  Resume Zocor when PO access.  Continue statin at discharge  Diabetes  HgbA1c pending, goal < 7.0  Uncontrolled    Mild mass effect with sulcal effacement  and approximately 3 mm rightward midline shift  No plans for hypertonic saline at this time  Repeat head CT in a.m.    Other Stroke Risk Factors  Advanced age  Cigarette smoker - advised to stop smoking  Hx stroke/TIA   Other Active Problems  CKD  Intubated  NPO  PLAN  Continue ASA suppository  Repeat  CT this morning shows stable appearance of large MCA infarct with cytotoxic edema and 3 mm midline shift  Continue aggressive blood pressure control and close neurological monitoring.   Hospital day # 2     I have personally examined this patient, reviewed notes, independently viewed imaging studies, participated in medical decision making and plan of care.ROS completed by me personally and pertinent positives fully documented  I have made any additions or clarifications directly to the above note. Agree with note above.  Patient presented with a large left MCA infarct due to terminal left ICA thrombosis due to atrial fibrillation despite being on anticoagulation with eliquis and was treated with mechanical thrombectomy with  complete recanalization but neurological exam and MRI suggests fairly large left MCA infarct. She is likely going to have significant aphasia and right hemiparesis. I had a long discussion with the patient's 2 daughters at the bedside about her prognosis, plan for treatment and answered questions. They understand her guarded prognosis and family patient would not like to live a life of dependency and in a nursing home which may be unavoidable. They agreed to DO NOT RESUSCITATE status and want to feel additional family members to visit. They do not want to escalate her care any further.. Discussed with Dr.Sood who agrees with plan This patient is critically ill and at significant risk of neurological worsening, death and care requires constant monitoring of vital signs, hemodynamics,respiratory and cardiac monitoring, extensive review of multiple databases, frequent neurological assessment, discussion with family, other specialists and medical decision making of high complexity.I have made any additions or clarifications directly to the above note.This critical care time does not reflect procedure time, or teaching time or supervisory time of PA/NP/Med Resident etc but could involve care discussion time.  I spent 45 minutes of neurocritical care time  in the care of  this patient.     Delia Heady, MD Medical Director [facility] Pager: [phone] 07/16/2016 12:54 PM    To contact Stroke Continuity provider, please refer to [email or web address]. After hours, contact General Neurology

## 2016-07-16 NOTE — Progress Notes (Signed)
RT NOTE:  Pt transported to CT and back to 3M without event.   

## 2016-07-16 NOTE — Progress Notes (Signed)
SLP Cancellation Note  Patient Details Name: [name] MRN: [id number] DOB: May 14, 1931   Cancelled treatment:        Intubated. Will follow.   Royce MacadamiaLitaker, Raiya Stainback Willis 07/16/2016, 9:08 AM   Breck CoonsLisa Willis Lonell FaceLitaker M.Ed [employer] Pager [phone]

## 2016-07-17 ENCOUNTER — Inpatient Hospital Stay (HOSPITAL_COMMUNITY): Payer: Medicare Other

## 2016-07-17 DIAGNOSIS — I482 Chronic atrial fibrillation: Secondary | ICD-10-CM

## 2016-07-17 DIAGNOSIS — J9621 Acute and chronic respiratory failure with hypoxia: Secondary | ICD-10-CM

## 2016-07-17 DIAGNOSIS — E785 Hyperlipidemia, unspecified: Secondary | ICD-10-CM

## 2016-07-17 DIAGNOSIS — Z720 Tobacco use: Secondary | ICD-10-CM

## 2016-07-17 DIAGNOSIS — J9601 Acute respiratory failure with hypoxia: Secondary | ICD-10-CM

## 2016-07-17 DIAGNOSIS — G936 Cerebral edema: Secondary | ICD-10-CM

## 2016-07-17 DIAGNOSIS — I10 Essential (primary) hypertension: Secondary | ICD-10-CM

## 2016-07-17 LAB — CBC
HEMATOCRIT: 39 % (ref 36.0–46.0)
Hemoglobin: 12.3 g/dL (ref 12.0–15.0)
MCH: 27.8 pg (ref 26.0–34.0)
MCHC: 31.5 g/dL (ref 30.0–36.0)
MCV: 88 fL (ref 78.0–100.0)
PLATELETS: 172 10*3/uL (ref 150–400)
RBC: 4.43 MIL/uL (ref 3.87–5.11)
RDW: 15.5 % (ref 11.5–15.5)
WBC: 9.8 10*3/uL (ref 4.0–10.5)

## 2016-07-17 LAB — BASIC METABOLIC PANEL
Anion gap: 14 (ref 5–15)
BUN: 86 mg/dL — ABNORMAL HIGH (ref 6–20)
CALCIUM: 9 mg/dL (ref 8.9–10.3)
CO2: 17 mmol/L — AB (ref 22–32)
CREATININE: 5.62 mg/dL — AB (ref 0.44–1.00)
Chloride: 109 mmol/L (ref 101–111)
GFR, EST AFRICAN AMERICAN: 7 mL/min — AB (ref >60)
GFR, EST NON AFRICAN AMERICAN: 6 mL/min — AB (ref >60)
Glucose, Bld: 193 mg/dL — ABNORMAL HIGH (ref 65–99)
Potassium: 5.4 mmol/L — ABNORMAL HIGH (ref 3.5–5.1)
Sodium: 140 mmol/L (ref 135–145)

## 2016-07-17 LAB — GLUCOSE, CAPILLARY
GLUCOSE-CAPILLARY: 185 mg/dL — AB (ref 65–99)
GLUCOSE-CAPILLARY: 186 mg/dL — AB (ref 65–99)
GLUCOSE-CAPILLARY: 194 mg/dL — AB (ref 65–99)
GLUCOSE-CAPILLARY: 101 mg/dL — AB (ref 65–99)
Glucose-Capillary: 80 mg/dL (ref 65–99)
Glucose-Capillary: 75 mg/dL (ref 65–99)

## 2016-07-17 LAB — TRIGLYCERIDES: TRIGLYCERIDES: 76 mg/dL (ref <150)

## 2016-07-17 LAB — HEMOGLOBIN A1C
Hgb A1c MFr Bld: 5.5 % (ref 4.8–5.6)
MEAN PLASMA GLUCOSE: 111 mg/dL

## 2016-07-17 MED ORDER — FENTANYL BOLUS VIA INFUSION
25.0000 ug | INTRAVENOUS | Status: DC | PRN
  Filled 2016-07-17: qty 25

## 2016-07-17 MED ORDER — INSULIN ASPART 100 UNIT/ML IV SOLN
10.0000 [IU] | Freq: Once | INTRAVENOUS | Status: AC
  Administered 2016-07-17: 10 [IU] via INTRAVENOUS

## 2016-07-17 MED ORDER — SODIUM CHLORIDE 0.9 % IV SOLN
25.0000 ug/h | INTRAVENOUS | Status: DC
  Administered 2016-07-17: 25 ug/h via INTRAVENOUS
  Filled 2016-07-17: qty 50

## 2016-07-17 MED ORDER — DEXTROSE 50 % IV SOLN
1.0000 | Freq: Once | INTRAVENOUS | Status: AC
  Administered 2016-07-17: 50 mL via INTRAVENOUS
  Filled 2016-07-17: qty 50

## 2016-07-17 MED ORDER — FENTANYL CITRATE (PF) 100 MCG/2ML IJ SOLN
50.0000 ug | Freq: Once | INTRAMUSCULAR | Status: AC
  Administered 2016-07-17: 50 ug via INTRAVENOUS
  Filled 2016-07-17: qty 2

## 2016-07-17 NOTE — Progress Notes (Signed)
PULMONARY / CRITICAL CARE MEDICINE   Name: [name] MRN: [id number] DOB: Jun 06, 1931    ADMISSION DATE:  07/28/2016 CONSULTATION DATE:  07/21/2016  REFERRING MD:  Dr. Pearlean BrownieSethi  CHIEF COMPLAINT:  Facial droop, Rt side weakness, aphasia  SUBJECTIVE:  For possible one way extubation  VITAL SIGNS: BP 115/77   Pulse (!) 54   Temp 99.6 F (37.6 C) (Oral)   Resp (!) 22   Ht 5\' 7"  (1.702 m)   Wt 143 lb 15.4 oz (65.3 kg)   SpO2 91%   BMI 22.55 kg/m   VENTILATOR SETTINGS: Vent Mode: PRVC FiO2 (%):  [40 %] 40 % Set Rate:  [16 bmp] 16 bmp Vt Set:  [480 mL] 480 mL PEEP:  [5 cmH20] 5 cmH20 Pressure Support:  [5 cmH20] 5 cmH20 Plateau Pressure:  [12 cmH20-21 cmH20] 21 cmH20  INTAKE / OUTPUT: I/O last 3 completed shifts: In: 4089.6 [I.V.:2339.6; NG/GT:1750] Out: 150 [Urine:150]  PHYSICAL EXAMINATION: General: on vent Neuro: opens eyes and grimaces with stimulation HEENT: ETT in place Cardiac: regular, no murmur Chest: no wheeze Abd: soft, non tender Ext: no edema Skin: no rashes  LABS:  BMET  Recent Labs Lab 07/15/16 0445 07/16/16 0407 07/17/16 0446  NA 143 138 140  K 4.2 4.5 5.4*  CL 111 110 109  CO2 22 16* 17*  BUN 55* 69* 86*  CREATININE 3.48* 4.88* 5.62*  GLUCOSE 118* 179* 193*    Electrolytes  Recent Labs Lab 07/15/16 0445  07/15/16 1700 07/16/16 0407 07/16/16 1630 07/17/16 0446  CALCIUM 8.7*  --   --  8.8*  --  9.0  MG  --   < > 2.0 2.0 2.3  --   PHOS  --   < > 5.2* 5.8* 6.5*  --   < > = values in this interval not displayed.  CBC  Recent Labs Lab 07/15/16 0445 07/16/16 0407 07/17/16 0446  WBC 7.9 16.6* 9.8  HGB 11.7* 10.7* 12.3  HCT 37.8 34.5* 39.0  PLT 162 166 172    Coag's  Recent Labs Lab 07/09/2016 1351  APTT 29  INR 1.01    Sepsis Markers No results for input(s): LATICACIDVEN, PROCALCITON, O2SATVEN in the last 168 hours.  ABG  Recent Labs Lab 07/17/2016 1842 07/15/16 0449  PHART 7.290* 7.418  PCO2ART 46.9  32.0  PO2ART 141.0* 154*    Liver Enzymes  Recent Labs Lab 07/20/2016 1351 07/16/16 0407  AST 24 24  ALT 19 8*  ALKPHOS 88 66  BILITOT 1.1 0.5  ALBUMIN 3.7 2.8*    Cardiac Enzymes No results for input(s): TROPONINI, PROBNP in the last 168 hours.  Glucose  Recent Labs Lab 07/16/16 1146 07/16/16 1557 07/16/16 1949 07/17/16 0000 07/17/16 0319 07/17/16 0748  GLUCAP 183* 131* 131* 140* 185* 186*    Imaging No results found.   STUDIES:  CT head 9/6 >> post op encephalomalacia frontal lobes b/l, chronic ischemia in b/l basal ganglia, thrombosis Lt ICA CT head 9/06 >> developing Lt frontal/parietal infarct and Lt insular cortex MRI brain 9/07 >> Large acute Lt MCA distribution infarct, 3 mm rightward shift CT head 9/08 >> no change  CULTURES:  ANTIBIOTICS:  SIGNIFICANT EVENTS: 9/06 admit CVA, IR, out on vent 9/07 hypotension / hypertension  LINES/TUBES: ETT 9/06 > R and L Fem Art sheath x2 9/06 >>>  DISCUSSION: 82 yo female smoker with facial droop, Rt sided weakness, aphasia from Lt MCA infarct and HTN emergency (SBP 200).  Had neuro IR  intervention and intubated for procedure.  She has PMHx of diastolic CHF, A fib on eliquis, HTN, HLD, CKD, COPD  ASSESSMENT / PLAN:  NEUROLOGIC A:   Lt MCA distribution CVA. P:   RASS goal 0  PULMONARY A: Compromised airway in setting of CVA. Hx of COPD. P:   Pressure support wean as tolerated >> need to clarify goals of care prior to extubation attempt Pulmicort, duoneb F/u CXR  CARDIOVASCULAR A:  HTN emergency. Hx of A fib, chronic diastolic CHF. P:  Wean off clevidipine Resume labetalol, amlodipine, simvastatin Hold outpt lasix, eliquis, lisinopril for now  RENAL Lab Results  Component Value Date   CREATININE 5.62 (H) 07/17/2016   CREATININE 4.88 (H) 07/16/2016   CREATININE 3.48 (H) 07/15/2016    Recent Labs Lab 07/15/16 0445 07/16/16 0407 07/17/16 0446  K 4.2 4.5 5.4*     A:   CKD  IV. Non gap metabolic acidosis. P:   Monitor renal fx, urine outpt  GASTROINTESTINAL A:   Nutrition. P:   Tube feeds Protonix for SUP  HEMATOLOGIC  Recent Labs  07/16/16 0407 07/17/16 0446  HGB 10.7* 12.3    A:   Anemia of critical illness. P:  F/u CBC  INFECTIOUS A:   No evidence for infection. P:   Monitor clinically  ENDOCRINE A:   Hyperglycemia. P:   Monitor blood sugar on BMET 193  D/w Dr. Pearlean Brownie.  Summary: Poor prognosis for meaningful neuro recover.  She also has progress renal dysfunction and would not be suitable candidate for dialysis.  Neurology to speak with family about goals of care >> transition to comfort measures likely best options. 9/9 family to decide on one way extubation.  Brett Canales Minor ACNP [facility] Pager [phone] till 3 pm If no answer page [phone] 07/17/2016, 10:01 AM   ATTENDING NOTE / ATTESTATION NOTE :   I have discussed the case with the resident/APP  Brett Canales Minor.  I agree with the resident/APP's  history, physical examination, assessment, and plans.    I have edited the above note and modified it according to our agreed history, physical examination, assessment and plan.   Briefly, patient with respiratory failure related to CVA. Also with chronic kidney disease. VSS. PE as above. Cont meds. Noted plans of family to withdraw support in am.    I have spent 30 minutes of critical care time with this patient today.  Family :Family updated at length today.  No family at bedside.    Pollie Meyer, MD 07/17/2016, 4:36 PM [facility] Pager [phone] After 3 pm or if no answer, call [phone]

## 2016-07-17 NOTE — Progress Notes (Signed)
STROKE TEAM PROGRESS NOTE   SUBJECTIVE (INTERVAL HISTORY) No family at bedside. Pt still intubated and not following commands. Move LUE spontaneously, but not other 3 limbs. Copious of secretion and not able to continue TF, OG tube came out. Daughter called in later and considering one way extubation tomorrow. Currently DNR.   OBJECTIVE Temp:  [97.9 F (36.6 C)-99.6 F (37.6 C)] 99.6 F (37.6 C) (09/09 1200) Pulse Rate:  [32-94] 55 (09/09 1200) Cardiac Rhythm: Atrial fibrillation (09/09 0800) Resp:  [19-34] 26 (09/09 1200) BP: (115-157)/(70-108) 141/77 (09/09 1200) SpO2:  [91 %-100 %] 98 % (09/09 1224) FiO2 (%):  [40 %] 40 % (09/09 1224) Weight:  [65.3 kg (143 lb 15.4 oz)] 65.3 kg (143 lb 15.4 oz) (09/09 0500)  CBC:   Recent Labs Lab 07/15/16 0445 07/16/16 0407 07/17/16 0446  WBC 7.9 16.6* 9.8  NEUTROABS 6.5 14.1*  --   HGB 11.7* 10.7* 12.3  HCT 37.8 34.5* 39.0  MCV 88.7 88.2 88.0  PLT 162 166 172    Basic Metabolic Panel:   Recent Labs Lab 07/16/16 0407 07/16/16 1630 07/17/16 0446  NA 138  --  140  K 4.5  --  5.4*  CL 110  --  109  CO2 16*  --  17*  GLUCOSE 179*  --  193*  BUN 69*  --  86*  CREATININE 4.88*  --  5.62*  CALCIUM 8.8*  --  9.0  MG 2.0 2.3  --   PHOS 5.8* 6.5*  --     Lipid Panel:     Component Value Date/Time   CHOL 98 07/16/2016 0406   TRIG 150 (H) 07/16/2016 0406   HDL 35 (L) 07/16/2016 0406   CHOLHDL 2.8 07/16/2016 0406   VLDL 30 07/16/2016 0406   LDLCALC 33 07/16/2016 0406   HgbA1c:  Lab Results  Component Value Date   HGBA1C 5.5 07/16/2016   Urine Drug Screen:     Component Value Date/Time   LABOPIA NONE DETECTED 2016-05-13 2033   COCAINSCRNUR NONE DETECTED 2016-05-13 2033   LABBENZ NONE DETECTED 2016-05-13 2033   AMPHETMU NONE DETECTED 2016-05-13 2033   THCU NONE DETECTED 2016-05-13 2033   LABBARB NONE DETECTED 2016-05-13 2033      IMAGING  Ct Head Wo Contrast 08/05/2016 1. Sulcal effacement in the left frontal and  parietal lobe with slight increased sub cortical edema is concerning for developing infarct. There is some loss of gray-white differentiation in the left frontal lobe both at the level of basal ganglia and in the super ganglionic region.  2. Partial loss of gray-white differentiation suggesting developing infarct at the left insular cortex.  3. No evidence for hemorrhage.  4. Chronic bifrontal encephalomalacia.   Mr Brain Wo Contrast 07/15/2016 1. Large acute infarct within the left MCA distribution. No acute hemorrhage.  2. Mild mass effect with sulcal effacement and approximately 3 mm rightward midline shift.   Portable Chest Xray 07/30/2016 Enlargement of cardiac silhouette with pulmonary vascular congestion. Tip of endotracheal tube projects 3.7 cm above carina.   Ct Head Code Stroke W/o Cm 07/13/2016 1. Negative for acute infarct  2. ASPECTS is 10  3. Postop encephalomalacia in the frontal lobes bilaterally is stable. Chronic ischemia in the basal ganglia bilaterally is unchanged.  4. Hyperdense terminal left internal carotid artery suggestive of thrombosis. Consider CTA for further evaluation.   CEREBRAL ANGIOGRAM  S/p Lt common carotid arteriogram,followed by complete revascularization of LT ICA T occlusion with x 1 pass with  Solitaire 40 mm FR retrieval device and 3.6 mg of superselective IA INTEGRELIN achieving a TICI 3 reperfusion.  TTE pending   PHYSICAL EXAM Physical exam  Temp:  [97.9 F (36.6 C)-99.6 F (37.6 C)] 99.6 F (37.6 C) (09/09 1200) Pulse Rate:  [32-94] 55 (09/09 1200) Resp:  [19-34] 26 (09/09 1200) BP: (115-157)/(70-108) 141/77 (09/09 1200) SpO2:  [91 %-100 %] 98 % (09/09 1224) FiO2 (%):  [40 %] 40 % (09/09 1224) Weight:  [143 lb 15.4 oz (65.3 kg)] 143 lb 15.4 oz (65.3 kg) (09/09 0500)  General - Well nourished, well developed, intubated.  Ophthalmologic - Fundi not visualized.  Cardiovascular - irregularly irregular heart rate and rhythm.  Neuro  - intubated, not on sedation, off cleviprex. Able to open eyes on voice but not following commands. Not blinking to visual threat bilaterally, left surgical pupil, right pupil 2.15mm, reactive to light, corneal positive, L>R, positive gag. LUE spontaneous movement, against gravity, no movement of RUE and BLEs even on pain stimulation. Left babinski positive.    ASSESSMENT/PLAN Ms. LUVIA ORZECHOWSKI is a 82 y.o. female with history of tobacco use, previous stroke, DVT, renal artery stenosis, peripheral vascular disease, atrial fibrillation ( on Eliquis ), hyperlipidemia, hypertension, COPD, and chronic kidney disease, presenting with right-sided weakness and aphasia due to occlusion of terminal left internal carotid artery.  She did not receive IV t-PA due to anticoagulation. S/P IR complete revascularization with solitaire device.  Stroke:  Large left MCA infarct felt to be embolic secondary to atrial fibrillation.  Resultant aphasia and right hemiplegia  MRI - Large acute infarct within the left MCA distribution  MRA - not performed  Cerebral angio - left terminal ICA and MCA origin s/p TICI3 revasculization  2D Echo - pending  LDL - 33  HgbA1c - 5.5  VTE prophylaxis - subcutaneous heparin Diet NPO time specified  Eliquis (apixaban) daily prior to admission, now on anticoagulation due to large infarct. On ASA now.   Therapy recommendations:  pending  Disposition:  Due to poor prognosis, family requested one way extubation tomorrow.  afib on eliquis  Known hx of afib  On eliquis at home  Stated compliance with medication  No AC now due to large infarct  Rate controlled.  Hypertension  Stable - Off Cleviprex  Systolic BP Goal 161 - 140 per Dr Corliss Skains  Long-term BP goal normotensive  Hyperlipidemia  Home meds:  Zocor 20 mg daily not resumed in hospital  LDL 33, goal < 70  Resume Zocor when PO access.  Continue statin at discharge  Other Stroke Risk  Factors  Advanced age  Cigarette smoker - advised to stop smoking  Hx stroke/TIA  Other Active Problems  Worsening CKD - K - 5.4 Saturday. BUN - 86;  Creat - 5.62  Intubated  NPO  Per Nursing note: 07/17/2016 "Spoke with both daughters bedside who informed RN that they will be ready to extubate the patient tomorrow Sunday 10,2017  Somewhere between 12 pm -2pm. They both agreed to stop tube feedings today."  Hospital day # 3   This patient is critically ill due to large right MCA infarct s/p mechanical thrombectomy, afib, HTN, intubation for respiratory failure and at significant risk of neurological worsening, death form hemorrhagic transformation, recurrent infarct, heart failure, respiratory failure, sepsis. This patient's care requires constant monitoring of vital signs, hemodynamics, respiratory and cardiac monitoring, review of multiple databases, neurological assessment, discussion with family, other specialists and medical decision making of high complexity.  I spent 40 minutes of neurocritical care time in the care of this patient.  Marvel Plan, MD PhD Stroke Neurology 07/17/2016 2:45 PM     To contact Stroke Continuity provider, please refer to [email or web address]. After hours, contact General Neurology

## 2016-07-17 NOTE — Progress Notes (Signed)
Spoke with both daughters bedside who informed RN that they will be ready to extubate the patient tomorrow Sunday 10,2017  Somewhere between 12 pm -2pm. They both agreed to stop tube feedings today.

## 2016-07-18 ENCOUNTER — Encounter (HOSPITAL_COMMUNITY): Payer: Self-pay | Admitting: General Practice

## 2016-07-18 LAB — GLUCOSE, CAPILLARY
GLUCOSE-CAPILLARY: 100 mg/dL — AB (ref 65–99)
Glucose-Capillary: 91 mg/dL (ref 65–99)
Glucose-Capillary: 115 mg/dL — ABNORMAL HIGH (ref 65–99)

## 2016-07-18 LAB — BASIC METABOLIC PANEL
ANION GAP: 15 (ref 5–15)
BUN: 103 mg/dL — AB (ref 6–20)
CALCIUM: 8.9 mg/dL (ref 8.9–10.3)
CO2: 16 mmol/L — AB (ref 22–32)
Chloride: 112 mmol/L — ABNORMAL HIGH (ref 101–111)
Creatinine, Ser: 6.28 mg/dL — ABNORMAL HIGH (ref 0.44–1.00)
GFR calc Af Amer: 6 mL/min — ABNORMAL LOW (ref >60)
GFR, EST NON AFRICAN AMERICAN: 5 mL/min — AB (ref >60)
GLUCOSE: 105 mg/dL — AB (ref 65–99)
Potassium: 5.3 mmol/L — ABNORMAL HIGH (ref 3.5–5.1)
Sodium: 143 mmol/L (ref 135–145)

## 2016-07-18 LAB — CBC
HCT: 38 % (ref 36.0–46.0)
HEMOGLOBIN: 11.4 g/dL — AB (ref 12.0–15.0)
MCH: 27.1 pg (ref 26.0–34.0)
MCHC: 30 g/dL (ref 30.0–36.0)
MCV: 90.5 fL (ref 78.0–100.0)
Platelets: 145 10*3/uL — ABNORMAL LOW (ref 150–400)
RBC: 4.2 MIL/uL (ref 3.87–5.11)
RDW: 15.5 % (ref 11.5–15.5)
WBC: 9.3 10*3/uL (ref 4.0–10.5)

## 2016-07-18 MED ORDER — MORPHINE BOLUS VIA INFUSION
2.0000 mg | INTRAVENOUS | Status: DC | PRN
  Filled 2016-07-18: qty 2

## 2016-07-18 MED ORDER — ACETAMINOPHEN 325 MG PO TABS: 650.0000 mg | ORAL_TABLET | Freq: Four times a day (QID) | ORAL | Status: DC | PRN

## 2016-07-18 MED ORDER — BIOTENE DRY MOUTH MT LIQD: 15.0000 mL | OROMUCOSAL | Status: DC | PRN

## 2016-07-18 MED ORDER — ACETAMINOPHEN 650 MG RE SUPP: 650.0000 mg | Freq: Four times a day (QID) | RECTAL | Status: DC | PRN

## 2016-07-18 MED ORDER — ATROPINE SULFATE 1 % OP SOLN
4.0000 [drp] | OPHTHALMIC | Status: DC | PRN
  Filled 2016-07-18: qty 2

## 2016-07-18 MED ORDER — ASPIRIN 300 MG RE SUPP
300.0000 mg | Freq: Every day | RECTAL | Status: DC
  Administered 2016-07-18: 300 mg via RECTAL
  Filled 2016-07-18: qty 1

## 2016-07-18 MED ORDER — POLYVINYL ALCOHOL 1.4 % OP SOLN
1.0000 [drp] | Freq: Four times a day (QID) | OPHTHALMIC | Status: DC | PRN
  Filled 2016-07-18: qty 15

## 2016-07-18 MED ORDER — SODIUM CHLORIDE 0.9 % IV SOLN
1.0000 mg/h | INTRAVENOUS | Status: DC
  Administered 2016-07-18: 10 mg/h via INTRAVENOUS
  Filled 2016-07-18 (×2): qty 10

## 2016-07-18 MED ORDER — MORPHINE SULFATE 25 MG/ML IV SOLN
10.0000 mg/h | INTRAVENOUS | Status: DC
  Filled 2016-07-18: qty 10

## 2016-07-18 MED ORDER — SCOPOLAMINE 1 MG/3DAYS TD PT72
1.0000 | MEDICATED_PATCH | TRANSDERMAL | Status: DC
  Administered 2016-07-18: 1.5 mg via TRANSDERMAL
  Filled 2016-07-18: qty 1

## 2016-07-18 NOTE — Progress Notes (Addendum)
PULMONARY / CRITICAL CARE MEDICINE   Name: [name] MRN: [id number] DOB: 1931/06/23    ADMISSION DATE:  07/13/2016 CONSULTATION DATE:  19-Jul-2016  REFERRING MD:  Dr. Pearlean Brownie  CHIEF COMPLAINT:  Facial droop, Rt side weakness, aphasia  SUBJECTIVE:  For possible one way extubation  VITAL SIGNS: BP 103/61   Pulse 97   Temp 97.8 F (36.6 C) (Oral)   Resp 15   Ht 5\' 7"  (1.702 m)   Wt 145 lb 11.6 oz (66.1 kg)   SpO2 99%   BMI 22.82 kg/m   VENTILATOR SETTINGS: Vent Mode: PRVC FiO2 (%):  [40 %] 40 % Set Rate:  [16 bmp] 16 bmp Vt Set:  [480 mL] 480 mL PEEP:  [5 cmH20] 5 cmH20 Plateau Pressure:  [10 cmH20-20 cmH20] 13 cmH20  INTAKE / OUTPUT: I/O last 3 completed shifts: In: 2520.9 [I.V.:1820.9; NG/GT:700] Out: 225 [Urine:225]  PHYSICAL EXAMINATION: General: on vent, nsc Neuro: opens eyes and grimaces with stimulation HEENT: ETT in place Cardiac: regular, no murmur Chest: no wheeze Abd: soft, non tender Ext: no edema Skin: no rashes  LABS:  BMET  Recent Labs Lab 07/16/16 0407 07/17/16 0446 07/18/16 0433  NA 138 140 143  K 4.5 5.4* 5.3*  CL 110 109 112*  CO2 16* 17* 16*  BUN 69* 86* 103*  CREATININE 4.88* 5.62* 6.28*  GLUCOSE 179* 193* 105*    Electrolytes  Recent Labs Lab 07/15/16 1700 07/16/16 0407 07/16/16 1630 07/17/16 0446 07/18/16 0433  CALCIUM  --  8.8*  --  9.0 8.9  MG 2.0 2.0 2.3  --   --   PHOS 5.2* 5.8* 6.5*  --   --     CBC  Recent Labs Lab 07/16/16 0407 07/17/16 0446 07/18/16 0433  WBC 16.6* 9.8 9.3  HGB 10.7* 12.3 11.4*  HCT 34.5* 39.0 38.0  PLT 166 172 145*    Coag's  Recent Labs Lab 07/30/2016 1351  APTT 29  INR 1.01    Sepsis Markers No results for input(s): LATICACIDVEN, PROCALCITON, O2SATVEN in the last 168 hours.  ABG  Recent Labs Lab July 19, 2016 1842 07/15/16 0449  PHART 7.290* 7.418  PCO2ART 46.9 32.0  PO2ART 141.0* 154*    Liver Enzymes  Recent Labs Lab 07/30/2016 1351 07/16/16 0407   AST 24 24  ALT 19 8*  ALKPHOS 88 66  BILITOT 1.1 0.5  ALBUMIN 3.7 2.8*    Cardiac Enzymes No results for input(s): TROPONINI, PROBNP in the last 168 hours.  Glucose  Recent Labs Lab 07/17/16 1521 07/17/16 1949 07/17/16 2323 07/18/16 0347 07/18/16 0757 07/18/16 1128  GLUCAP 101* 80 75 100* 91 115*    Imaging No results found.   STUDIES:  CT head 9/6 >> post op encephalomalacia frontal lobes b/l, chronic ischemia in b/l basal ganglia, thrombosis Lt ICA CT head 9/06 >> developing Lt frontal/parietal infarct and Lt insular cortex MRI brain 9/07 >> Large acute Lt MCA distribution infarct, 3 mm rightward shift CT head 9/08 >> no change  CULTURES:  ANTIBIOTICS:  SIGNIFICANT EVENTS: 9/06 admit CVA, IR, out on vent 9/07 hypotension / hypertension  LINES/TUBES: ETT 9/06 > R and L Fem Art sheath x2 9/06 >>>  DISCUSSION: 82 yo female smoker with facial droop, Rt sided weakness, aphasia from Lt MCA infarct and HTN emergency (SBP 200).  Had neuro IR intervention and intubated for procedure.  She has PMHx of diastolic CHF, A fib on eliquis, HTN, HLD, CKD, COPD  ASSESSMENT / PLAN:  NEUROLOGIC  A:   Lt MCA distribution CVA. P:   RASS goal 0  PULMONARY A: Compromised airway in setting of CVA. Hx of COPD. P:   Pressure support wean as tolerated >> need to clarify goals of care prior to extubation attempt Pulmicort, duoneb F/u CXR  CARDIOVASCULAR A:  HTN emergency. Hx of A fib, chronic diastolic CHF. P:  Wean off clevidipine Resume labetalol, amlodipine, simvastatin Hold outpt lasix, eliquis, lisinopril for now  RENAL Lab Results  Component Value Date   CREATININE 6.28 (H) 07/18/2016   CREATININE 5.62 (H) 07/17/2016   CREATININE 4.88 (H) 07/16/2016    Recent Labs Lab 07/16/16 0407 07/17/16 0446 07/18/16 0433  K 4.5 5.4* 5.3*     A:   CKD IV. Non gap metabolic acidosis. P:   Monitor renal fx, urine outpt  GASTROINTESTINAL A:    Nutrition. P:   Tube feeds Protonix for SUP  HEMATOLOGIC  Recent Labs  07/17/16 0446 07/18/16 0433  HGB 12.3 11.4*    A:   Anemia of critical illness. P:  F/u CBC  INFECTIOUS A:   No evidence for infection. P:   Monitor clinically  ENDOCRINE A:   Hyperglycemia. P:   Monitor blood sugar on BMET 193  D/w Dr. Pearlean BrownieSethi.  Summary: Poor prognosis for meaningful neuro recover.  She also has progress renal dysfunction and would not be suitable candidate for dialysis.  Neurology to speak with family about goals of care >> transition to comfort measures likely best options. 9/10 family to decide on one way extubation.  Brett CanalesSteve Minor ACNP [facility] Pager [phone] till 3 pm If no answer page [phone] 07/18/2016, 11:40 AM   ATTENDING NOTE / ATTESTATION NOTE :   I have discussed the case with the resident/APP  Brett CanalesSteve Minor.   I agree with the resident/APP's  history, physical examination, assessment, and plans.    I have edited the above note and modified it according to our agreed history, physical examination, assessment and plan.   Patient has been extubated. Noted plans to make pt comfort care after terminal extubation. Family at bedside.   PCCM will sign off for now.   Pollie MeyerJ. Angelo A de Dios, MD 07/18/2016, 5:26 PM [facility] Pager [phone] After 3 pm or if no answer, call [phone]

## 2016-07-18 NOTE — Progress Notes (Signed)
STROKE TEAM PROGRESS NOTE   SUBJECTIVE (INTERVAL HISTORY) No family at bedside. Pt still intubated and not following commands. Barely open eyes on voice. Barely move LUE on pain. Currently DNR. Plan for one way extubation at 2pm.   OBJECTIVE Temp:  [97.5 F (36.4 C)-100.1 F (37.8 C)] 97.7 F (36.5 C) (09/10 0759) Pulse Rate:  [35-107] 96 (09/10 0900) Cardiac Rhythm: Atrial fibrillation (09/10 0800) Resp:  [13-30] 15 (09/10 0900) BP: (95-161)/(62-97) 118/70 (09/10 0900) SpO2:  [96 %-100 %] 99 % (09/10 0900) FiO2 (%):  [40 %] 40 % (09/10 0838) Weight:  [66.1 kg (145 lb 11.6 oz)] 66.1 kg (145 lb 11.6 oz) (09/10 0500)  CBC:   Recent Labs Lab 07/15/16 0445 07/16/16 0407 07/17/16 0446 07/18/16 0433  WBC 7.9 16.6* 9.8 9.3  NEUTROABS 6.5 14.1*  --   --   HGB 11.7* 10.7* 12.3 11.4*  HCT 37.8 34.5* 39.0 38.0  MCV 88.7 88.2 88.0 90.5  PLT 162 166 172 145*    Basic Metabolic Panel:   Recent Labs Lab 07/16/16 0407 07/16/16 1630 07/17/16 0446 07/18/16 0433  NA 138  --  140 143  K 4.5  --  5.4* 5.3*  CL 110  --  109 112*  CO2 16*  --  17* 16*  GLUCOSE 179*  --  193* 105*  BUN 69*  --  86* 103*  CREATININE 4.88*  --  5.62* 6.28*  CALCIUM 8.8*  --  9.0 8.9  MG 2.0 2.3  --   --   PHOS 5.8* 6.5*  --   --     Lipid Panel:     Component Value Date/Time   CHOL 98 07/16/2016 0406   TRIG 76 07/17/2016 2021   HDL 35 (L) 07/16/2016 0406   CHOLHDL 2.8 07/16/2016 0406   VLDL 30 07/16/2016 0406   LDLCALC 33 07/16/2016 0406   HgbA1c:  Lab Results  Component Value Date   HGBA1C 5.5 07/16/2016   Urine Drug Screen:     Component Value Date/Time   LABOPIA NONE DETECTED 08-05-2016 2033   COCAINSCRNUR NONE DETECTED 08-05-2016 2033   LABBENZ NONE DETECTED 08-05-2016 2033   AMPHETMU NONE DETECTED 08-05-2016 2033   THCU NONE DETECTED 08-05-2016 2033   LABBARB NONE DETECTED 08-05-2016 2033      IMAGING I have personally reviewed the radiological images below and agree with  the radiology interpretations.  Ct Head Wo Contrast 08/01/2016 1. Sulcal effacement in the left frontal and parietal lobe with slight increased sub cortical edema is concerning for developing infarct. There is some loss of gray-white differentiation in the left frontal lobe both at the level of basal ganglia and in the super ganglionic region.  2. Partial loss of gray-white differentiation suggesting developing infarct at the left insular cortex.  3. No evidence for hemorrhage.  4. Chronic bifrontal encephalomalacia.   Mr Brain Wo Contrast 07/15/2016 1. Large acute infarct within the left MCA distribution. No acute hemorrhage.  2. Mild mass effect with sulcal effacement and approximately 3 mm rightward midline shift.   Portable Chest Xray 07/28/2016 Enlargement of cardiac silhouette with pulmonary vascular congestion. Tip of endotracheal tube projects 3.7 cm above carina.   Ct Head Code Stroke W/o Cm 07/12/2016 1. Negative for acute infarct  2. ASPECTS is 10  3. Postop encephalomalacia in the frontal lobes bilaterally is stable. Chronic ischemia in the basal ganglia bilaterally is unchanged.  4. Hyperdense terminal left internal carotid artery suggestive of thrombosis. Consider CTA for  further evaluation.   CEREBRAL ANGIOGRAM  S/p Lt common carotid arteriogram,followed by complete revascularization of LT ICA T occlusion with x 1 pass with Solitaire 40 mm FR retrieval device and 3.6 mg of superselective IA INTEGRELIN achieving a TICI 3 reperfusion.  TTE - canceled  CUS - cancelled   PHYSICAL EXAM  Temp:  [97.5 F (36.4 C)-100.1 F (37.8 C)] 97.7 F (36.5 C) (09/10 0759) Pulse Rate:  [35-107] 96 (09/10 0900) Resp:  [13-30] 15 (09/10 0900) BP: (95-161)/(62-97) 118/70 (09/10 0900) SpO2:  [96 %-100 %] 99 % (09/10 0900) FiO2 (%):  [40 %] 40 % (09/10 0838) Weight:  [66.1 kg (145 lb 11.6 oz)] 66.1 kg (145 lb 11.6 oz) (09/10 0500)  General - Well nourished, well developed,  intubated.  Ophthalmologic - Fundi not visualized.  Cardiovascular - irregularly irregular heart rate and rhythm.  Neuro - intubated, not on sedation. Barely open eyes on voice but not following commands. Not blinking to visual threat bilaterally, left surgical pupil, right pupil 2.29mm, reactive to light, corneal positive, positive gag. LUE mild withdrawal on pain stimulation, no movement of RUE and BLEs even on pain stimulation. Left babinski positive.    ASSESSMENT/PLAN [name] is a 82 y.o. female with history of tobacco use, previous stroke, DVT, renal artery stenosis, peripheral vascular disease, atrial fibrillation ( on Eliquis ), hyperlipidemia, hypertension, COPD, and chronic kidney disease, presenting with right-sided weakness and aphasia due to occlusion of terminal left internal carotid artery.  She did not receive IV t-PA due to anticoagulation. S/P IR complete revascularization with solitaire device.  Stroke:  Large left MCA infarct felt to be embolic secondary to atrial fibrillation.  Resultant aphasia and right hemiplegia  MRI - Large acute infarct within the left MCA distribution  MRA - not performed  Cerebral angio - left terminal ICA and MCA origin s/p TICI3 revasculization  2D Echo - canceled  LDL - 33  HgbA1c - 5.5  VTE prophylaxis - subcutaneous heparin Diet NPO time specified  Eliquis (apixaban) daily prior to admission, now on anticoagulation due to large infarct. On ASA now.   Disposition:  Due to poor prognosis, family requested one way extubation at 2 PM.  afib on eliquis  Known hx of afib  On eliquis at home  Stated compliance with medication  No AC now due to large infarct  Rate controlled.  Hypertension  Stable - Off Cleviprex  Long-term BP goal normotensive  Hyperlipidemia  Home meds:  Zocor 20 mg daily not resumed in hospital  LDL 33, goal < 70  Resume Zocor when PO access.  Other Stroke Risk  Factors  Advanced age  Cigarette smoker - advised to stop smoking  Hx stroke/TIA  Other Active Problems  Worsening CKD - K - 5.3 BUN - 103;  Creat - 6.28  Intubated  NPO  Hospital day # 4  This patient is critically ill due to large right MCA infarct s/p mechanical thrombectomy, afib, HTN, intubation for respiratory failure and at significant risk of neurological worsening, death form hemorrhagic transformation, recurrent infarct, heart failure, respiratory failure, sepsis. This patient's care requires constant monitoring of vital signs, hemodynamics, respiratory and cardiac monitoring, review of multiple databases, neurological assessment, discussion with family, other specialists and medical decision making of high complexity. I spent 40 minutes of neurocritical care time in the care of this patient.  Marvel Plan, MD PhD Stroke Neurology 07/18/2016 3:54 PM   To contact Stroke Continuity provider, please refer to  [email or web address] After hours, contact General Neurology

## 2016-07-18 NOTE — Progress Notes (Signed)
Wasted 20 mL of fentanyl in sink with Dorthula MatasKendra Jones, RN.

## 2016-07-18 NOTE — Progress Notes (Signed)
Pt requiring morphine gtt post-extubation.  CCM verbal order for end of life order set, Neuro made aware of situation.  Pt sats 95% on 4 L O2 via , resp rate 13, family at bedside post-extubation.  Will monitor.

## 2016-07-18 NOTE — Procedures (Signed)
Extubation Procedure Note  Patient Details:   Name: [name] DOB: 1931/01/14 MRN: [id number]   Airway Documentation:     Evaluation  O2 sats: currently acceptable Complications: No apparent complications Patient did tolerate procedure well. Bilateral Breath Sounds: Diminished   No   Terminal extubation done at this time. Family stepped o ut of room for procedure, but came back when finished. Placed on 4LNC.   Lurlean LeydenDick, Jana Swartzlander Bailey 07/18/2016, 3:26 PM

## 2016-07-19 ENCOUNTER — Encounter (HOSPITAL_COMMUNITY): Payer: Self-pay | Admitting: Interventional Radiology

## 2016-08-08 NOTE — Progress Notes (Signed)
Pt found without pulse, breathless. Confirmed by second RN, Chuck HintAngelo Brickhouse. MD notified. Family notified.

## 2016-08-08 NOTE — Discharge Summary (Signed)
Stroke Discharge Summary  Patient ID: [name]   MRN: [id number]      DOB: 1931/08/07  Date of Admission: 08/04/2016 Date of Discharge: 07/22/2016  Attending Physician:  No att. providers found, Stroke MD Consulting Physician(s):    pulmonary/intensive care  Patient's PCP:  Avon Gully, MD  DISCHARGE DIAGNOSIS:  Active Problems:   Acute ischemic left MCA stroke (HCC) s/p mechanical thrombectomy    afib on eliquis   Cytotoxic brain edema (HCC)   HTN   HLD   CKD   Smoker   History of stroke   BMI: Body mass index is 22.82 kg/m.  Past Medical History:  Diagnosis Date  . Acute respiratory failure (HCC)   . Aortic valve sclerosis   . Bilateral iliac artery occlusion (HCC)   . Chronic diastolic CHF (congestive heart failure) (HCC)   . CKD (chronic kidney disease), stage IV (HCC)   . COPD (chronic obstructive pulmonary disease) (HCC)   . DVT (deep venous thrombosis) (HCC) 05/2008  . Essential hypertension, benign   . Hypertensive heart disease   . Mixed hyperlipidemia   . Normal cardiac stress test 2011  . Orthostatic hypotension   . Pericardial effusion    a. 2D Echo 03/2015: EF 65-70%, mod-severe concentric LVH, mild-mod MR, severely dilated LA, mildly reduced RV systolic function, mod dilated RA, small-moderate pericardial effusion (stable).  . Persistent atrial fibrillation (HCC)    History of GI bleed on Coumadin  . PVD (peripheral vascular disease) (HCC)    a. s/p bilateral iliac stenting (followed by Dr. Allyson Sabal).  . Renal artery stenosis (HCC)    a. 70% right renal artery stenosis during angiography performed in 2009. b. Last renal duplex 07/2014: 1-59% reduction in R renal artery, >60% diameter reduction in L renal artery, repeat recommended 6 months.  . Stroke (HCC) 03/2013   Reportedly mild  . Syncope, near   . Tobacco abuse    Past Surgical History:  Procedure Laterality Date  . BRAIN TUMOR EXCISION  1999   [facility]angi[facility])  . CATARACT  EXTRACTION Bilateral   . DOPPLER ECHOCARDIOGRAPHY  02/12/2010   EF =>55%  . ILIAC ARTERY STENT Left 05/21/2008   Dr. Allyson Sabal  . ILIAC ARTERY STENT Right 01/07/2009   Dr. Allyson Sabal  . IR GENERIC HISTORICAL  07/25/2016   IR PERCUTANEOUS ART THROMBECTOMY/INFUSION INTRACRANIAL INC DIAG ANGIO 07/23/2016 Julieanne Cotton, MD [facility]-INTERV RAD  . LEA DOPPLER  09/13/2012   Right EIA stent >60%;rgt SFA occluded at prox. and mid level w/reconstitution at distal SFA/pop.;rgt PTA occlude;lft EIAstent >50%;lft mid SFA occluded;lft ATA & PTA occluded  . Lens replacement Left   . NM MYOCAR PERF WALL MOTION  02/12/2010   PERSANTINE: EF 57%;LV norm  . RADIOLOGY WITH ANESTHESIA N/A 07/26/2016   Procedure: RADIOLOGY WITH ANESTHESIA;  Surgeon: Julieanne Cotton, MD;  Location: [facility] OR;  Service: Radiology;  Laterality: N/A;  . Renal duplex doppler  02/25/2012   Right 60-99%;lft renal artery no evidence siginificant reduction;rgt & lft kidneys norm size       Medication List    ASK your doctor about these medications   albuterol 108 (90 Base) MCG/ACT inhaler Commonly known as:  PROVENTIL HFA;VENTOLIN HFA Inhale 2 puffs into the lungs every 6 (six) hours as needed for wheezing.   amLODipine 2.5 MG tablet Commonly known as:  NORVASC Take 7.5 mg by mouth at bedtime.   apixaban 2.5 MG Tabs tablet Commonly known as:  ELIQUIS Take 2.5 mg by  mouth 2 (two) times daily.   BREO ELLIPTA 100-25 MCG/INH Aepb Generic drug:  fluticasone furoate-vilanterol Inhale 1 puff into the lungs daily.   furosemide 40 MG tablet Commonly known as:  LASIX Take 1 tablet (40 mg total) by mouth daily.   labetalol 300 MG tablet Commonly known as:  NORMODYNE Take 300 mg by mouth 2 (two) times daily.   lisinopril 40 MG tablet Commonly known as:  PRINIVIL,ZESTRIL Take 1 tablet (40 mg total) by mouth daily.   potassium chloride SA 20 MEQ tablet Commonly known as:  K-DUR,KLOR-CON Take 2 tablets (40 mEq total) by mouth 2 (two) times  daily.   simvastatin 20 MG tablet Commonly known as:  ZOCOR Take 20 mg by mouth every evening.   tiotropium 18 MCG inhalation capsule Commonly known as:  SPIRIVA Place 18 mcg into inhaler and inhale every morning.   Vitamin D (Cholecalciferol) 1000 units Tabs Take 1 capsule by mouth daily.       LABORATORY STUDIES CBC    Component Value Date/Time   WBC 9.3 07/18/2016 0433   RBC 4.20 07/18/2016 0433   HGB 11.4 (L) 07/18/2016 0433   HCT 38.0 07/18/2016 0433   PLT 145 (L) 07/18/2016 0433   MCV 90.5 07/18/2016 0433   MCH 27.1 07/18/2016 0433   MCHC 30.0 07/18/2016 0433   RDW 15.5 07/18/2016 0433   LYMPHSABS 1.0 07/16/2016 0407   MONOABS 1.6 (H) 07/16/2016 0407   EOSABS 0.0 07/16/2016 0407   BASOSABS 0.0 07/16/2016 0407   CMP    Component Value Date/Time   NA 143 07/18/2016 0433   K 5.3 (H) 07/18/2016 0433   CL 112 (H) 07/18/2016 0433   CO2 16 (L) 07/18/2016 0433   GLUCOSE 105 (H) 07/18/2016 0433   BUN 103 (H) 07/18/2016 0433   CREATININE 6.28 (H) 07/18/2016 0433   CALCIUM 8.9 07/18/2016 0433   PROT 5.4 (L) 07/16/2016 0407   ALBUMIN 2.8 (L) 07/16/2016 0407   AST 24 07/16/2016 0407   ALT 8 (L) 07/16/2016 0407   ALKPHOS 66 07/16/2016 0407   BILITOT 0.5 07/16/2016 0407   GFRNONAA 5 (L) 07/18/2016 0433   GFRAA 6 (L) 07/18/2016 0433   COAGS Lab Results  Component Value Date   INR 1.01 07/13/2016   INR 1.28 11/04/2015   INR 1.20 11/05/2013   Lipid Panel    Component Value Date/Time   CHOL 98 07/16/2016 0406   TRIG 76 07/17/2016 2021   HDL 35 (L) 07/16/2016 0406   CHOLHDL 2.8 07/16/2016 0406   VLDL 30 07/16/2016 0406   LDLCALC 33 07/16/2016 0406   HgbA1C  Lab Results  Component Value Date   HGBA1C 5.5 07/16/2016   Cardiac Panel (last 3 results) No results for input(s): CKTOTAL, CKMB, TROPONINI, RELINDX in the last 72 hours. Urinalysis    Component Value Date/Time   COLORURINE YELLOW 07/16/2016 2033   APPEARANCEUR CLEAR 07/12/2016 2033   LABSPEC  1.020 07/20/2016 2033   PHURINE 6.5 07/30/2016 2033   GLUCOSEU 250 (A) 08/06/2016 2033   HGBUR MODERATE (A) 07/20/2016 2033   BILIRUBINUR NEGATIVE 07/20/2016 2033   KETONESUR NEGATIVE 07/13/2016 2033   PROTEINUR >300 (A) 07/18/2016 2033   UROBILINOGEN 0.2 11/05/2013 1000   NITRITE NEGATIVE 07/20/2016 2033   LEUKOCYTESUR NEGATIVE 08/04/2016 2033   Urine Drug Screen     Component Value Date/Time   LABOPIA NONE DETECTED 07/15/2016 2033   COCAINSCRNUR NONE DETECTED 07/27/2016 2033   LABBENZ NONE DETECTED 07/26/2016 2033   AMPHETMU  NONE DETECTED 07/30/2016 2033   THCU NONE DETECTED 07/27/2016 2033   LABBARB NONE DETECTED 07/30/2016 2033    Alcohol Level    Component Value Date/Time   ETH <5 08/02/2016 1351     SIGNIFICANT DIAGNOSTIC STUDIES Ct Head Wo Contrast 07/17/2016 1. Sulcal effacement in the left frontal and parietal lobe with slight increased sub cortical edema is concerning for developing infarct. There is some loss of gray-white differentiation in the left frontal lobe both at the level of basal ganglia and in the super ganglionic region.  2. Partial loss of gray-white differentiation suggesting developing infarct at the left insular cortex.  3. No evidence for hemorrhage.  4. Chronic bifrontal encephalomalacia.   Mr Brain Wo Contrast 07/15/2016 1. Large acute infarct within the left MCA distribution. No acute hemorrhage.  2. Mild mass effect with sulcal effacement and approximately 3 mm rightward midline shift.   Portable Chest Xray 07/27/2016 Enlargement of cardiac silhouette with pulmonary vascular congestion. Tip of endotracheal tube projects 3.7 cm above carina.   Ct Head Code Stroke W/o Cm 07/13/2016 1. Negative for acute infarct  2. ASPECTS is 10  3. Postop encephalomalacia in the frontal lobes bilaterally is stable. Chronic ischemia in the basal ganglia bilaterally is unchanged.  4. Hyperdense terminal left internal carotid artery suggestive of thrombosis.  Consider CTA for further evaluation.   CEREBRAL ANGIOGRAM  S/p Lt common carotid arteriogram,followed by complete revascularization of LT ICA T occlusion with x 1 pass with Solitaire 40 mm FR retrieval device and 3.6 mg of superselective IA INTEGRELIN achieving a TICI 3 reperfusion.  TTE - canceled  CUS - cancelled    HISTORY OF PRESENT ILLNESS [name] is an 82 y.o. female with hx as below presented to OSH with L MCA stroke like symptoms that started at 1300. 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 Transferred to [facility] for IR.   Date last known well: 07/10/2016 Time last known well: 1300 tPA Given: No: on Eliquis   HOSPITAL COURSE Ms. ONDINE GEMME is a 82 y.o. female with history of tobacco use, previous stroke, DVT, renal artery stenosis, peripheral vascular disease, atrial fibrillation ( on Eliquis ), hyperlipidemia, hypertension, COPD, and chronic kidney disease, presenting with right-sided weakness and aphasia due to occlusion of terminal left internal carotid artery.  She did not receive IV t-PA due to anticoagulation. S/P IR complete revascularization with solitaire device. However, pt neurological condition did not improve. MRI still showed large left MCA infarct. Due to poor prognosis, family requested one-way extubation. Patient pathway on 2016/08/05 1215am  Stroke:  Large left MCA infarct felt to be embolic secondary to atrial fibrillation.  Resultant aphasia and right hemiplegia  MRI - Large acute infarct within the left MCA distribution  MRA - not performed  Cerebral angio - left terminal ICA and MCA origin s/p TICI3 revasculization  2D Echo - canceled  LDL - 33  HgbA1c - 5.5  VTE prophylaxis - subcutaneous heparin  Diet NPO time specified  Eliquis (apixaban) daily prior to admission, now on anticoagulation due to large infarct. On ASA now.   Disposition:  Due to poor prognosis, family  requested one way extubation.  afib on eliquis  Known hx of afib  On eliquis at home  Stated compliance with medication  No AC now due to large infarct  Rate controlled.  Hypertension  Stable - Off Cleviprex  Long-term BP goal normotensive  Hyperlipidemia  Home meds:  Zocor 20 mg daily not resumed in hospital  LDL 33, goal < 70  Other Stroke Risk Factors  Advanced age  Cigarette smoker - advised to stop smoking  Hx stroke/TIA  Other Active Problems  Worsening CKD - K - 5.3 BUN - 103;  Creat - 6.28  NPO  DISCHARGE EXAM Patient deceased   Marvel PlanJindong Christien Frankl, MD PhD Stroke Neurology 07/22/2016 5:09 PM

## 2016-08-08 DEATH — deceased

## 2017-09-17 IMAGING — MR MR HEAD W/O CM
9 of 10 series · 35 of 48 positions shown · non-contrast
Comparison: Same day head CT.

CLINICAL DATA: Stroke-like symptoms. Right-sided facial droop and
right-sided weakness. Aphasia and drooling.

EXAM:
MRI HEAD WITHOUT CONTRAST
TECHNIQUE: Multiplanar, multiecho pulse sequences of the brain and surrounding
structures were obtained without intravenous contrast.

[Series 3: T1 · sagittal · 5.0mm · 0.47mm/px · 3 of 23 slices shown]
[im 1/23]
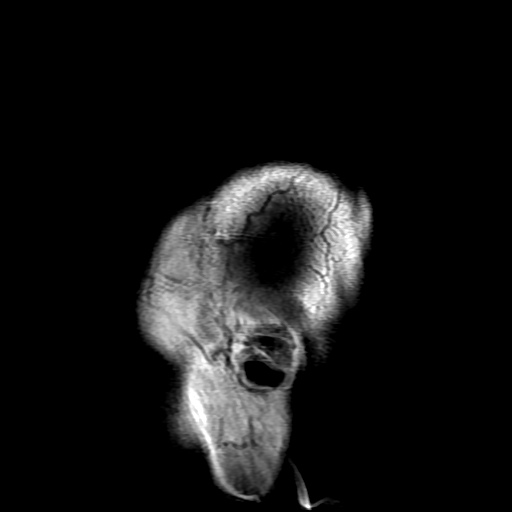
[im 12/23]
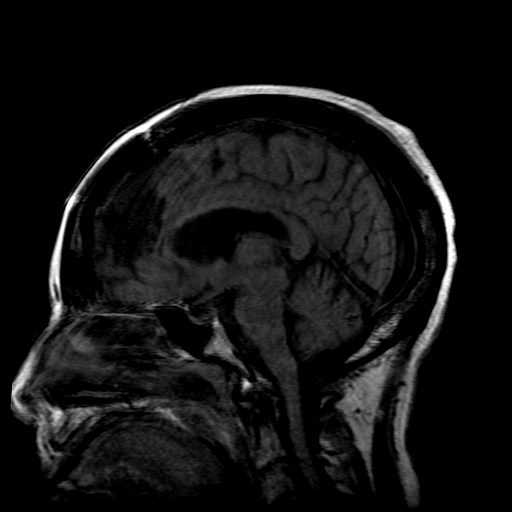
[im 23/23]
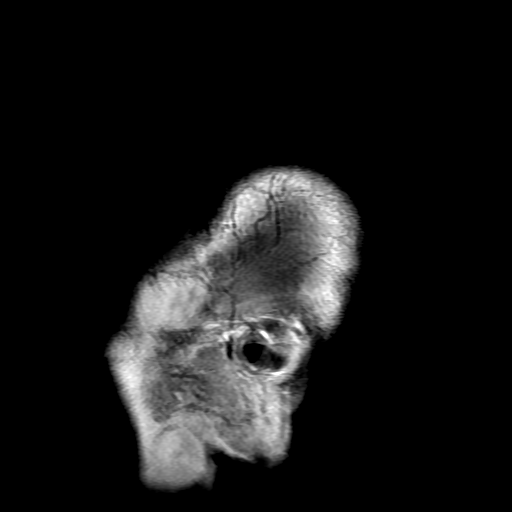

[Series 4: DWI · axial · 3.0mm · 1.09mm/px · z∈[-93,+36]mm · 8 of 90 slices shown (1 of 4)]
[im 1/90]
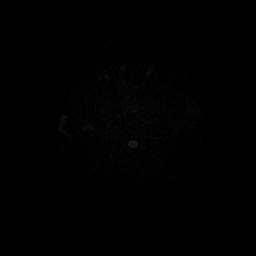
[im 10/90]
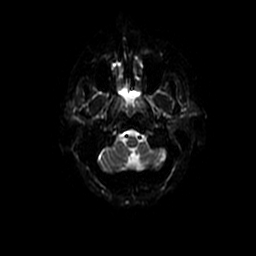
[im 30/90]
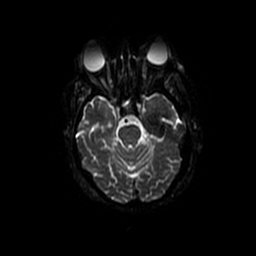
[im 40/90]
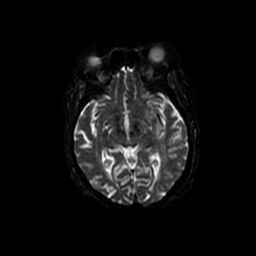
[im 50/90]
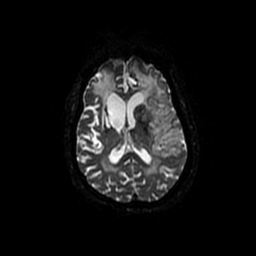
[im 60/90]
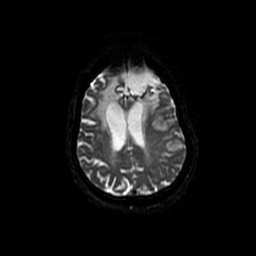
[im 80/90]
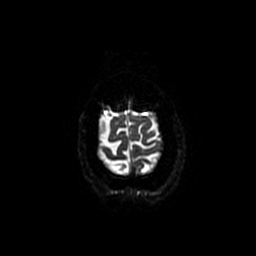
[im 90/90]
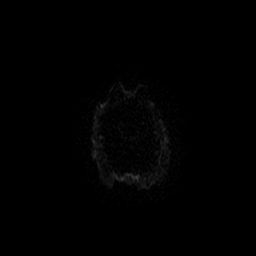

[Series 5: T2 · axial · 5.0mm · 0.43mm/px · z∈[-85,+44]mm · 2 of 23 slices shown (1 of 2)]
[im 1/23]
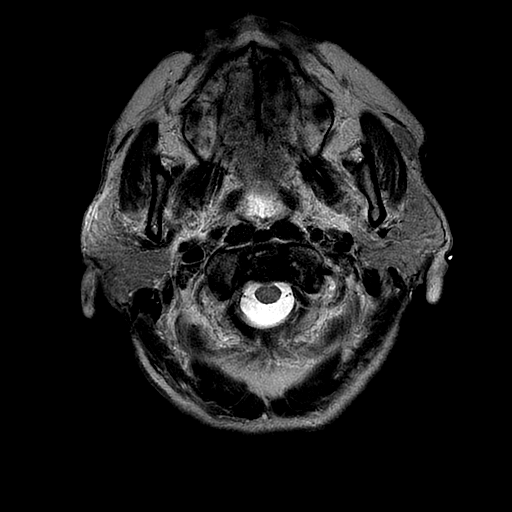
[im 23/23]
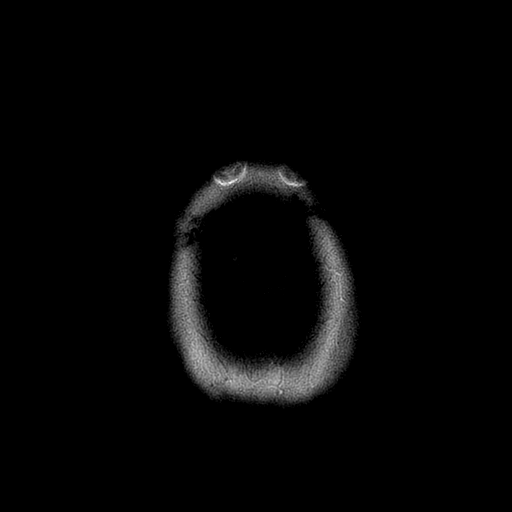

[Series 6: FLAIR · axial · 5.0mm · 0.43mm/px · z∈[-85,+44]mm · 2 of 23 slices shown]
[im 1/23]
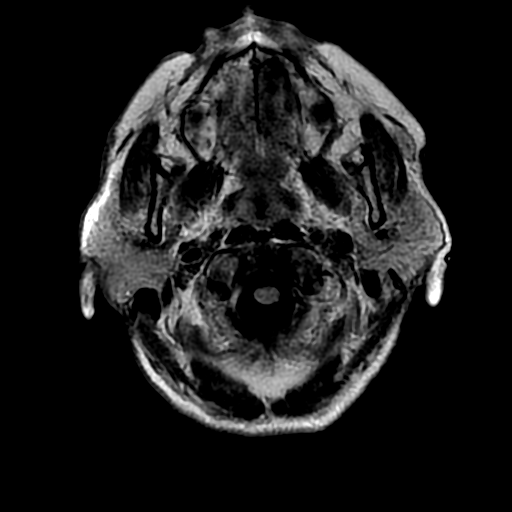
[im 23/23]
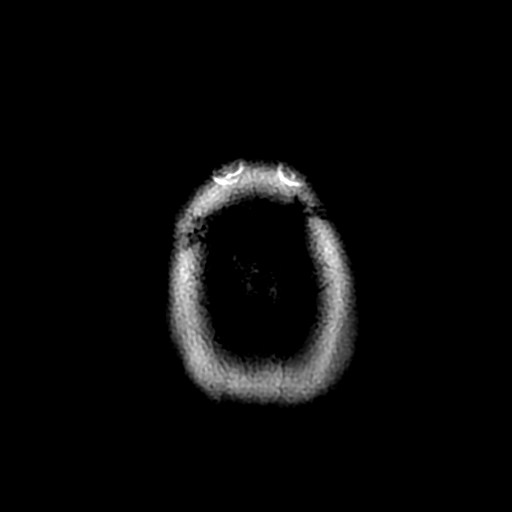

[Series 7: DWI · coronal · 5.0mm · 1.09mm/px · 7 of 68 slices shown (2 of 4)]
[im 1/68]
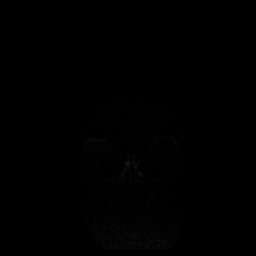
[im 12/68]
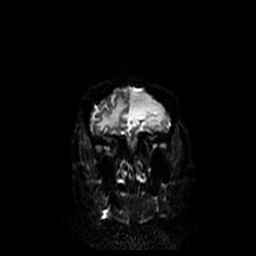
[im 23/68]
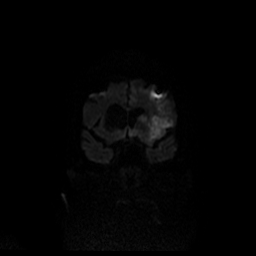
[im 34/68]
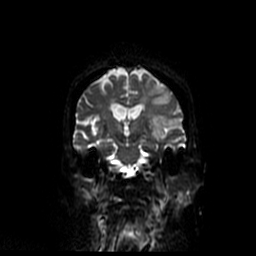
[im 45/68]
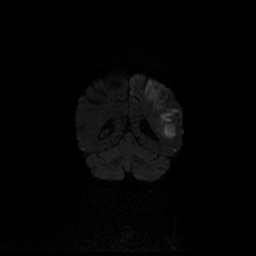
[im 56/68]
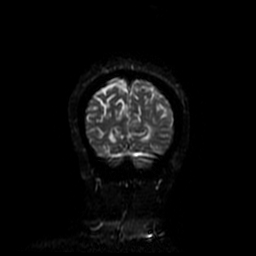
[im 68/68]
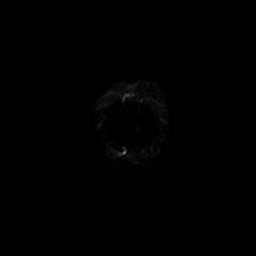

[Series 8: ax mpgr · axial · 5.0mm · 0.43mm/px · 1 of 23 slices shown]
[im 1/23]
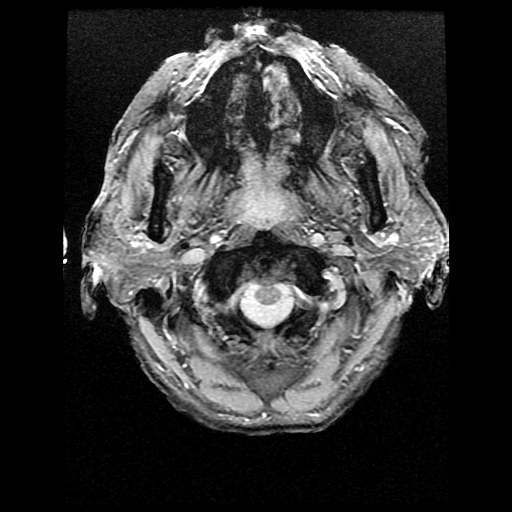

[Series 10: T2 · coronal · 5.0mm · 0.43mm/px · 3 of 29 slices shown (2 of 2)]
[im 1/29]
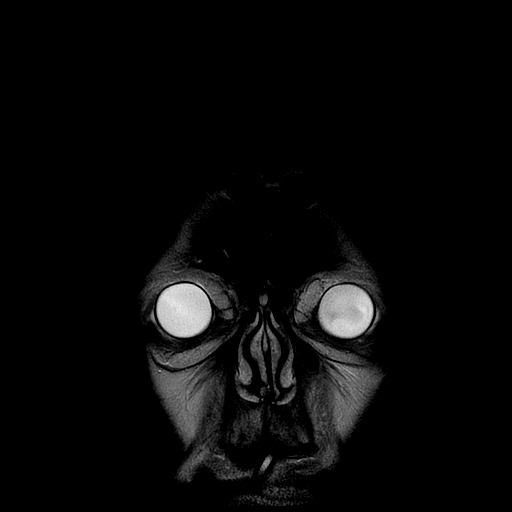
[im 15/29]
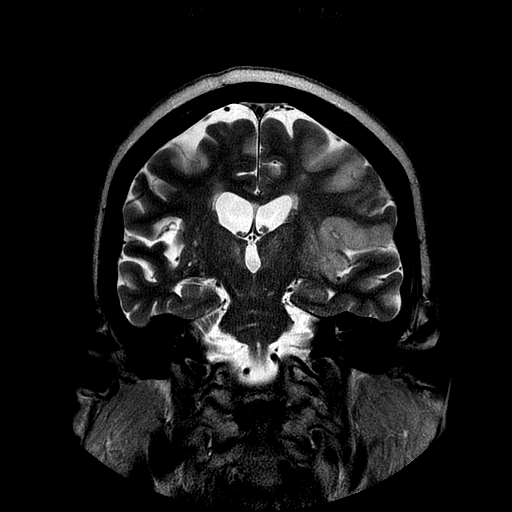
[im 29/29]
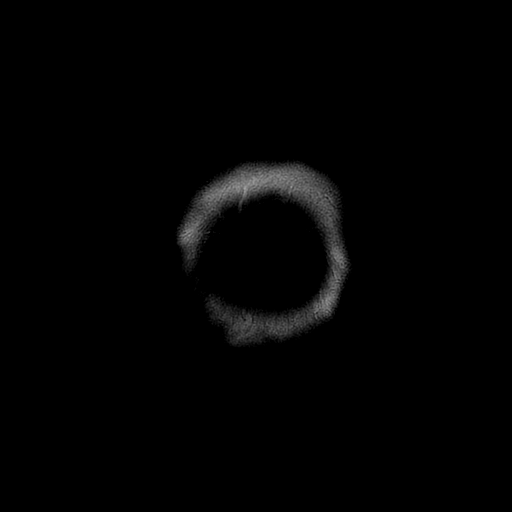

[Series 400: DWI · axial · 3.0mm · 1.09mm/px · z∈[-93,+36]mm · 5 of 45 slices shown (3 of 4)]
[im 1/45]
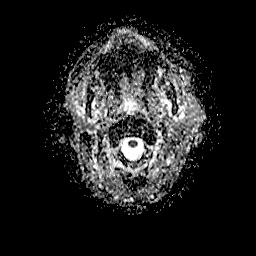
[im 12/45]
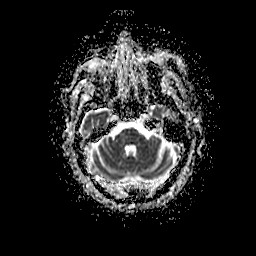
[im 23/45]
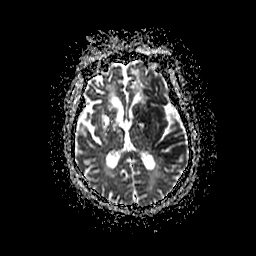
[im 34/45]
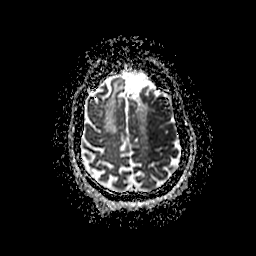
[im 45/45]
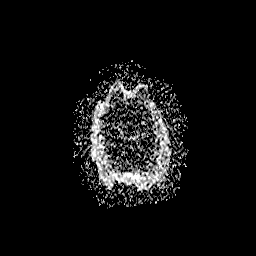

[Series 700: DWI · coronal · 5.0mm · 1.09mm/px · 4 of 34 slices shown (4 of 4)]
[im 1/34]
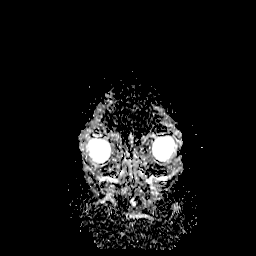
[im 12/34]
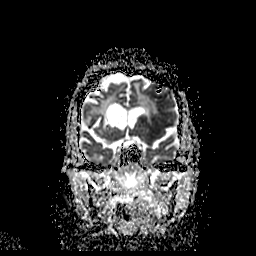
[im 23/34]
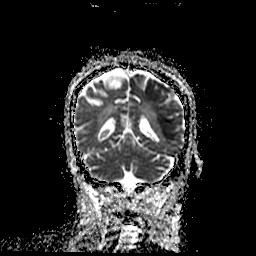
[im 34/34]
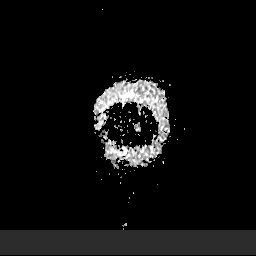

[35 of 48 positions shown; findings below may reference images not displayed]

FINDINGS: Brain: There is extensive diffusion restriction throughout the left
MCA distribution, involving the left frontal and parietal cortices,
the left basal ganglia, left insula and a portion of the left
temporal lobe. The midline structures are normal. There is
hyperintense T2 weighted signal throughout the distribution of the
infarction with sulcal effacement and rightward midline shift of
approximately 3 mm. There is extensive left frontal
encephalomalacia, which is post surgical in etiology. No acute
hydrocephalus. No extra-axial fluid collection.

Vascular: No evidence of chronic microhemorrhage or amyloid
angiopathy. There is hemosiderin deposition along the left frontal
postoperative site.

Skull and upper cervical spine: The visualized skull base,
calvarium, upper cervical spine and extracranial soft tissues are
normal.

Sinuses/Orbits: Fluid filling the nasopharynx. Atelectatic right
maxillary sinus. Bilateral lens replacements.
IMPRESSION: 1. Large acute infarct within the left MCA distribution. No acute
hemorrhage.
2. Mild mass effect with sulcal effacement and approximately 3 mm
rightward midline shift.

## 2017-09-18 IMAGING — CR DG CHEST 1V PORT
1 series · 1 of 1 positions shown · non-contrast
Comparison: 07/14/2016 and earlier.

CLINICAL DATA: 85-year-old female intubated. Presented as Code
stroke with left ICA occlusion.

EXAM:
PORTABLE CHEST 1 VIEW

[AP]
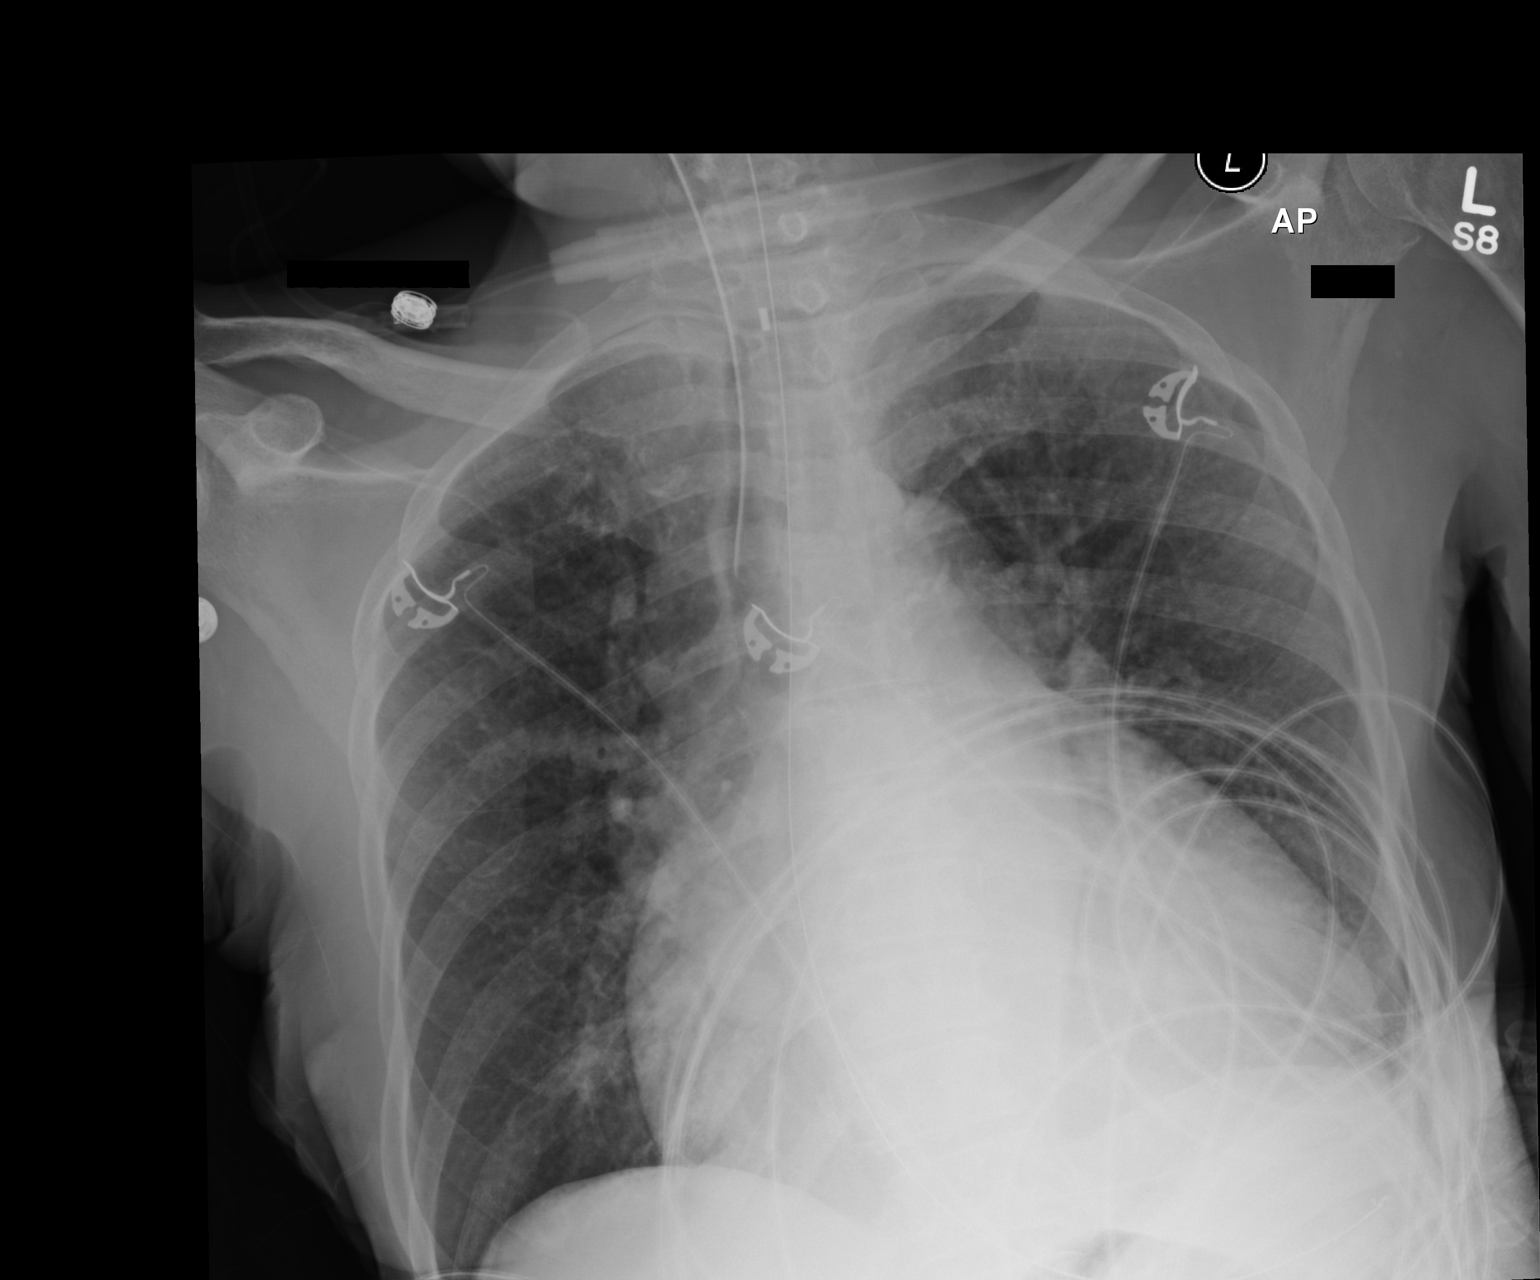

[1 of 1 positions shown; findings below may reference images not displayed]

FINDINGS: Portable AP semi upright view at at 2965 hours. Endotracheal tube
has been slightly advanced, projects 2-3 cm above the carina.
Enteric tube courses to the abdomen, with tip visible in the left
upper quadrant.

Stable cardiomegaly and mediastinal contours. Stable lung volumes.
No pneumothorax, pleural effusion or consolidation. Pulmonary
vascular congestion without overt edema.
IMPRESSION: 1. Enteric tube now in place, terminates in the left upper quadrant
at the level of the stomach.
2. ET tube remains in good position.
3. Stable cardiomegaly and mild vascular congestion without overt
edema.
